# Patient Record
Sex: Female | Born: 1940 | Race: White | Hispanic: No | Marital: Married | Smoking: Never smoker
Health system: Southern US, Community
[De-identification: ages and names within clinical notes are randomized; demographics above are authoritative.]

## PROBLEM LIST (undated history)

## (undated) DIAGNOSIS — K5900 Constipation, unspecified: Secondary | ICD-10-CM

## (undated) DIAGNOSIS — E785 Hyperlipidemia, unspecified: Secondary | ICD-10-CM

## (undated) DIAGNOSIS — K219 Gastro-esophageal reflux disease without esophagitis: Secondary | ICD-10-CM

## (undated) DIAGNOSIS — D649 Anemia, unspecified: Secondary | ICD-10-CM

## (undated) DIAGNOSIS — I6522 Occlusion and stenosis of left carotid artery: Secondary | ICD-10-CM

## (undated) DIAGNOSIS — F32A Depression, unspecified: Secondary | ICD-10-CM

## (undated) DIAGNOSIS — E119 Type 2 diabetes mellitus without complications: Secondary | ICD-10-CM

## (undated) DIAGNOSIS — I639 Cerebral infarction, unspecified: Secondary | ICD-10-CM

## (undated) DIAGNOSIS — I2510 Atherosclerotic heart disease of native coronary artery without angina pectoris: Secondary | ICD-10-CM

## (undated) DIAGNOSIS — G459 Transient cerebral ischemic attack, unspecified: Secondary | ICD-10-CM

## (undated) DIAGNOSIS — I10 Essential (primary) hypertension: Secondary | ICD-10-CM

## (undated) DIAGNOSIS — M1990 Unspecified osteoarthritis, unspecified site: Secondary | ICD-10-CM

## (undated) DIAGNOSIS — F329 Major depressive disorder, single episode, unspecified: Secondary | ICD-10-CM

## (undated) HISTORY — PX: ABDOMINAL HYSTERECTOMY: SHX81

## (undated) HISTORY — PX: JOINT REPLACEMENT: SHX530

## (undated) HISTORY — PX: CORONARY ANGIOPLASTY: SHX604

---
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
## 2019-02-12 ENCOUNTER — Inpatient Hospital Stay (HOSPITAL_BASED_OUTPATIENT_CLINIC_OR_DEPARTMENT_OTHER)
Admission: EM | Admit: 2019-02-12 | Discharge: 2019-02-16 | DRG: 037 | Disposition: A | Discharge status: Home Health | Payer: Medicare Other | Attending: Family Medicine | Admitting: Family Medicine

## 2019-02-12 ENCOUNTER — Encounter (HOSPITAL_BASED_OUTPATIENT_CLINIC_OR_DEPARTMENT_OTHER): Payer: Self-pay

## 2019-02-12 ENCOUNTER — Emergency Department (HOSPITAL_BASED_OUTPATIENT_CLINIC_OR_DEPARTMENT_OTHER): Payer: Medicare Other

## 2019-02-12 ENCOUNTER — Emergency Department (HOSPITAL_COMMUNITY): Payer: Medicare Other

## 2019-02-12 ENCOUNTER — Other Ambulatory Visit: Payer: Self-pay

## 2019-02-12 DIAGNOSIS — E663 Overweight: Secondary | ICD-10-CM | POA: Diagnosis not present

## 2019-02-12 DIAGNOSIS — E876 Hypokalemia: Secondary | ICD-10-CM | POA: Diagnosis not present

## 2019-02-12 DIAGNOSIS — I771 Stricture of artery: Secondary | ICD-10-CM | POA: Diagnosis present

## 2019-02-12 DIAGNOSIS — I10 Essential (primary) hypertension: Secondary | ICD-10-CM | POA: Diagnosis not present

## 2019-02-12 DIAGNOSIS — E785 Hyperlipidemia, unspecified: Secondary | ICD-10-CM | POA: Diagnosis not present

## 2019-02-12 DIAGNOSIS — I351 Nonrheumatic aortic (valve) insufficiency: Secondary | ICD-10-CM | POA: Diagnosis not present

## 2019-02-12 DIAGNOSIS — E119 Type 2 diabetes mellitus without complications: Secondary | ICD-10-CM

## 2019-02-12 DIAGNOSIS — E1151 Type 2 diabetes mellitus with diabetic peripheral angiopathy without gangrene: Secondary | ICD-10-CM | POA: Diagnosis present

## 2019-02-12 DIAGNOSIS — I6381 Other cerebral infarction due to occlusion or stenosis of small artery: Secondary | ICD-10-CM | POA: Diagnosis not present

## 2019-02-12 DIAGNOSIS — I639 Cerebral infarction, unspecified: Secondary | ICD-10-CM

## 2019-02-12 DIAGNOSIS — Z8249 Family history of ischemic heart disease and other diseases of the circulatory system: Secondary | ICD-10-CM

## 2019-02-12 DIAGNOSIS — I160 Hypertensive urgency: Secondary | ICD-10-CM | POA: Diagnosis present

## 2019-02-12 DIAGNOSIS — Z7902 Long term (current) use of antithrombotics/antiplatelets: Secondary | ICD-10-CM

## 2019-02-12 DIAGNOSIS — R7401 Elevation of levels of liver transaminase levels: Secondary | ICD-10-CM | POA: Diagnosis present

## 2019-02-12 DIAGNOSIS — Z1159 Encounter for screening for other viral diseases: Secondary | ICD-10-CM

## 2019-02-12 DIAGNOSIS — I672 Cerebral atherosclerosis: Secondary | ICD-10-CM | POA: Diagnosis not present

## 2019-02-12 DIAGNOSIS — I6601 Occlusion and stenosis of right middle cerebral artery: Secondary | ICD-10-CM | POA: Diagnosis present

## 2019-02-12 DIAGNOSIS — Z6828 Body mass index (BMI) 28.0-28.9, adult: Secondary | ICD-10-CM | POA: Diagnosis not present

## 2019-02-12 DIAGNOSIS — I2510 Atherosclerotic heart disease of native coronary artery without angina pectoris: Secondary | ICD-10-CM | POA: Diagnosis not present

## 2019-02-12 DIAGNOSIS — Z955 Presence of coronary angioplasty implant and graft: Secondary | ICD-10-CM

## 2019-02-12 DIAGNOSIS — R740 Nonspecific elevation of levels of transaminase and lactic acid dehydrogenase [LDH]: Secondary | ICD-10-CM | POA: Diagnosis not present

## 2019-02-12 DIAGNOSIS — Z888 Allergy status to other drugs, medicaments and biological substances status: Secondary | ICD-10-CM

## 2019-02-12 DIAGNOSIS — R4701 Aphasia: Secondary | ICD-10-CM | POA: Diagnosis present

## 2019-02-12 DIAGNOSIS — R29700 NIHSS score 0: Secondary | ICD-10-CM | POA: Diagnosis not present

## 2019-02-12 DIAGNOSIS — D72829 Elevated white blood cell count, unspecified: Secondary | ICD-10-CM | POA: Diagnosis not present

## 2019-02-12 DIAGNOSIS — I6522 Occlusion and stenosis of left carotid artery: Secondary | ICD-10-CM

## 2019-02-12 DIAGNOSIS — Z7982 Long term (current) use of aspirin: Secondary | ICD-10-CM

## 2019-02-12 DIAGNOSIS — G4733 Obstructive sleep apnea (adult) (pediatric): Secondary | ICD-10-CM | POA: Diagnosis present

## 2019-02-12 DIAGNOSIS — Z79899 Other long term (current) drug therapy: Secondary | ICD-10-CM

## 2019-02-12 DIAGNOSIS — E1165 Type 2 diabetes mellitus with hyperglycemia: Secondary | ICD-10-CM | POA: Diagnosis not present

## 2019-02-12 DIAGNOSIS — G459 Transient cerebral ischemic attack, unspecified: Secondary | ICD-10-CM | POA: Diagnosis not present

## 2019-02-12 DIAGNOSIS — R270 Ataxia, unspecified: Secondary | ICD-10-CM | POA: Diagnosis not present

## 2019-02-12 HISTORY — DX: Hyperlipidemia, unspecified: E78.5

## 2019-02-12 HISTORY — DX: Essential (primary) hypertension: I10

## 2019-02-12 LAB — SARS CORONAVIRUS 2 AG (30 MIN TAT): SARS Coronavirus 2 Ag: NEGATIVE

## 2019-02-12 LAB — COMPREHENSIVE METABOLIC PANEL
ALT: 68 U/L — ABNORMAL HIGH (ref 0–44)
AST: 42 U/L — ABNORMAL HIGH (ref 15–41)
Albumin: 3.8 g/dL (ref 3.5–5.0)
Alkaline Phosphatase: 55 U/L (ref 38–126)
Anion gap: 12 (ref 5–15)
BUN: 16 mg/dL (ref 8–23)
CO2: 26 mmol/L (ref 22–32)
Calcium: 9.1 mg/dL (ref 8.9–10.3)
Chloride: 100 mmol/L (ref 98–111)
Creatinine, Ser: 0.41 mg/dL — ABNORMAL LOW (ref 0.44–1.00)
GFR calc Af Amer: >60 mL/min (ref >60)
GFR calc non Af Amer: >60 mL/min (ref >60)
Glucose, Bld: 176 mg/dL — ABNORMAL HIGH (ref 70–99)
Potassium: 3.4 mmol/L — ABNORMAL LOW (ref 3.5–5.1)
Sodium: 138 mmol/L (ref 135–145)
Total Bilirubin: 0.4 mg/dL (ref 0.3–1.2)
Total Protein: 6.8 g/dL (ref 6.5–8.1)

## 2019-02-12 LAB — DIFFERENTIAL
Abs Immature Granulocytes: 0.05 10*3/uL (ref 0.00–0.07)
Basophils Absolute: 0.1 10*3/uL (ref 0.0–0.1)
Basophils Relative: 1 %
Eosinophils Absolute: 0.2 10*3/uL (ref 0.0–0.5)
Eosinophils Relative: 2 %
Immature Granulocytes: 1 %
Lymphocytes Relative: 24 %
Lymphs Abs: 2.5 10*3/uL (ref 0.7–4.0)
Monocytes Absolute: 0.8 10*3/uL (ref 0.1–1.0)
Monocytes Relative: 8 %
Neutro Abs: 6.8 10*3/uL (ref 1.7–7.7)
Neutrophils Relative %: 64 %

## 2019-02-12 LAB — URINALYSIS, ROUTINE W REFLEX MICROSCOPIC
Bilirubin Urine: NEGATIVE
Glucose, UA: NEGATIVE mg/dL
Ketones, ur: NEGATIVE mg/dL
Nitrite: NEGATIVE
Protein, ur: NEGATIVE mg/dL
Specific Gravity, Urine: 1.02 (ref 1.005–1.030)
pH: 7.5 (ref 5.0–8.0)

## 2019-02-12 LAB — PROTIME-INR
INR: 0.9 (ref 0.8–1.2)
Prothrombin Time: 12 seconds (ref 11.4–15.2)

## 2019-02-12 LAB — RAPID URINE DRUG SCREEN, HOSP PERFORMED
Amphetamines: NOT DETECTED
Barbiturates: NOT DETECTED
Benzodiazepines: NOT DETECTED
Cocaine: NOT DETECTED
Opiates: NOT DETECTED
Tetrahydrocannabinol: NOT DETECTED

## 2019-02-12 LAB — CBC
HCT: 40.2 % (ref 36.0–46.0)
Hemoglobin: 13 g/dL (ref 12.0–15.0)
MCH: 27.4 pg (ref 26.0–34.0)
MCHC: 32.3 g/dL (ref 30.0–36.0)
MCV: 84.6 fL (ref 80.0–100.0)
Platelets: 248 10*3/uL (ref 150–400)
RBC: 4.75 MIL/uL (ref 3.87–5.11)
RDW: 13.2 % (ref 11.5–15.5)
WBC: 10.5 10*3/uL (ref 4.0–10.5)
nRBC: 0 % (ref 0.0–0.2)

## 2019-02-12 LAB — URINALYSIS, MICROSCOPIC (REFLEX)

## 2019-02-12 LAB — GLUCOSE, CAPILLARY: Glucose-Capillary: 237 mg/dL — ABNORMAL HIGH (ref 70–99)

## 2019-02-12 LAB — CBG MONITORING, ED: Glucose-Capillary: 184 mg/dL — ABNORMAL HIGH (ref 70–99)

## 2019-02-12 LAB — APTT: aPTT: 27 seconds (ref 24–36)

## 2019-02-12 LAB — ETHANOL: Alcohol, Ethyl (B): <10 mg/dL (ref <10)

## 2019-02-12 MED ORDER — IOHEXOL 350 MG/ML SOLN
100.0000 mL | Freq: Once | INTRAVENOUS | Status: AC | PRN
  Administered 2019-02-12: 18:00:00 100 mL via INTRAVENOUS

## 2019-02-12 MED ORDER — ALTEPLASE 100 MG IV SOLR
INTRAVENOUS | Status: AC
  Filled 2019-02-12: qty 1

## 2019-02-12 MED ORDER — NICARDIPINE HCL IN NACL 20-0.86 MG/200ML-% IV SOLN
INTRAVENOUS | Status: AC
  Administered 2019-02-12: 17:00:00 2.5 mg/h via INTRAVENOUS
  Filled 2019-02-12: qty 200

## 2019-02-12 MED ORDER — NICARDIPINE HCL IN NACL 20-0.86 MG/200ML-% IV SOLN
3.0000 mg/h | INTRAVENOUS | Status: DC
  Administered 2019-02-12: 2.5 mg/h via INTRAVENOUS

## 2019-02-12 MED ORDER — SODIUM CHLORIDE 0.9 % IV SOLN
INTRAVENOUS | Status: DC | PRN
  Administered 2019-02-12: 500 mL via INTRAVENOUS

## 2019-02-12 MED ORDER — INSULIN ASPART 100 UNIT/ML ~~LOC~~ SOLN
0.0000 [IU] | Freq: Every day | SUBCUTANEOUS | Status: DC
  Administered 2019-02-13 (×2): 2 [IU] via SUBCUTANEOUS
  Administered 2019-02-15: 3 [IU] via SUBCUTANEOUS

## 2019-02-12 MED ORDER — LABETALOL HCL 5 MG/ML IV SOLN: 5.0000 mg | INTRAVENOUS | Status: DC | PRN

## 2019-02-12 MED ORDER — INSULIN ASPART 100 UNIT/ML ~~LOC~~ SOLN
0.0000 [IU] | Freq: Three times a day (TID) | SUBCUTANEOUS | Status: DC
  Administered 2019-02-13: 18:00:00 2 [IU] via SUBCUTANEOUS
  Administered 2019-02-13: 3 [IU] via SUBCUTANEOUS
  Administered 2019-02-14 – 2019-02-15 (×2): 1 [IU] via SUBCUTANEOUS
  Administered 2019-02-16: 12:00:00 2 [IU] via SUBCUTANEOUS
  Administered 2019-02-16: 5 [IU] via SUBCUTANEOUS

## 2019-02-12 NOTE — ED Notes (Signed)
Family at bedside. 

## 2019-02-12 NOTE — Plan of Care (Signed)
progressing towards goals

## 2019-02-12 NOTE — ED Notes (Signed)
Report given to [place] RN with [employer]

## 2019-02-12 NOTE — ED Notes (Signed)
Please contact Effie Shy  with updates at [phone].

## 2019-02-12 NOTE — ED Notes (Signed)
Pt to CT

## 2019-02-12 NOTE — Consult Note (Addendum)
Referring Physician: Dr. Lockie Molauratolo    Chief Complaint: Intermittent expressive aphasia  HPI: Margaret Butler is an 78 y.o. female who [facility] as a Code Stroke.  Past medical history include hypertension, hyperlipidemia, placement of 2 coronary artery stents, no hx of stroke, TIA, or MI. Patient's last known normal is 1330. Per patient and her husband she started experiencing intermittent transient expressive aphasia shortly after. Teleneurology was consulted. Was within TPA window while at OSH, but was not deemed a candidate given mild symptoms and NIHSS of 0. CT at [facility] was negative for acute infarct or hemorrhage and she was transferred over to [facility] for further eval/work-up for possible TIA/stroke.  Upon arrival to the ED patient's aphasia had fully resolved. 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  Exam was fairly unremarkable except for right sided extinction and right upper extremity ataxia.   LSN: [id number] tPA Given: no, out of time window  Past Medical History:  Diagnosis Date  . Hyperlipidemia   . Hypertension     History reviewed. No pertinent surgical history.  History reviewed. No pertinent family history. Social History:  has no history on file for tobacco, alcohol, and drug.  Allergies:  Allergies  Allergen Reactions  . Atorvastatin     Other reaction(s): Other (See Comments) Myalgias  . Lisinopril Cough       . Sertraline Other (See Comments)    Headache  Headache    . Simvastatin     Other reaction(s): Other (See Comments) Myalgias    Home Medications:   Current Meds  Medication Sig  . aspirin EC 325 MG tablet Take 325 mg by mouth as needed (for sudden onset of stroke-like symptoms).  Marland Kitchen. aspirin EC 81 MG tablet  Take 81 mg by mouth as needed (for headaches).  . Calcium Carbonate-Vitamin D (CALCIUM 600+D) 600-400 MG-UNIT tablet Take 1 tablet by mouth daily with breakfast.   . clopidogrel (PLAVIX) 75 MG tablet Take 75 mg by mouth daily at 8 pm.   . Dextromethorphan-guaiFENesin (CORICIDIN HBP CONGESTION/COUGH) 10-200 MG CAPS Take 1 capsule by mouth every 8 (eight) hours as needed (for congestion).  Marland Kitchen. escitalopram (LEXAPRO) 10 MG tablet Take 10 mg by mouth daily after breakfast.   . Magnesium 250 MG TABS Take 500 mg by mouth daily after supper.  . metoprolol succinate (TOPROL-XL) 25 MG 24 hr tablet Take 50 mg by mouth daily after supper.   . Omega-3 Fatty Acids (FISH OIL) 1000 MG CAPS Take 1,000 mg by mouth daily with breakfast.  . Potassium Gluconate 595 MG TBCR Take 595 mg by mouth daily with breakfast.  . simvastatin (ZOCOR) 20 MG tablet Take 20 mg by mouth at bedtime.   Marland Kitchen. telmisartan-hydrochlorothiazide (MICARDIS HCT) 80-25 MG tablet Take 1 tablet by mouth every morning.   . TURMERIC PO Take 1,500-3,000 mg by mouth daily with breakfast.     ROS: Review of Systems  Constitutional: Negative.   HENT: Negative.   Eyes: Negative.   Respiratory: Negative.   Cardiovascular: Negative.  Negative for chest pain.  Gastrointestinal: Negative.   Genitourinary: Positive for frequency and urgency.  Musculoskeletal: Negative.   Skin: Negative.   Neurological: Positive for tingling, sensory change, speech change and weakness. Negative for dizziness.  Endo/Heme/Allergies: Negative.   Psychiatric/Behavioral: Negative for  depression and substance abuse. The patient is not nervous/anxious.    Physical Examination: Blood pressure (!) 170/51, pulse 74, temperature 98.1 F (36.7 C), temperature source Oral, resp. rate 16, height 5\' 3"  (1.6 m), weight 68.4 kg, SpO2 98 %.  HEENT: [place]/AT Lungs: [place]/AT Ext: No edema  Neurologic Examination: Mental Status: Alert, fully oriented x 5. No dysarthria. No increased  latency of verbal or motor responses. Good eye contact. Good attention. Able to follow a complex directional 3 step command quickly. Speech fluent. Naming intact. No neglect.  Cranial Nerves: II:  Visual fields intact with no extinction to DSS. PERRL.  III,IV, VI: Ptosis not present, EOMI with no nystagmus.  V,VII: Temp sensation equal bilaterally. No facial droop VIII: hearing intact to voice IX,X: Phonation intact.  XI: Symmetric shoulder shrug XII: midline tongue extension  Motor: Right : Upper extremity   5/5    Left:     Upper extremity   5/5  Lower extremity   5/5     Lower extremity   5/5 Tone and bulk:normal tone throughout; no atrophy noted Sensory: Temp intact x 4. RHB extinction. Deep Tendon Reflexes:  Right: Upper Extremity   Left: Upper extremity   biceps (C-5 to C-6) 2/4   biceps (C-5 to C-6) 2/4 tricep (C7) 2/4    triceps (C7) 2/4 Brachioradialis (C6) 2/4  Brachioradialis (C6) 2/4  Lower Extremity Lower Extremity  quadriceps (L-2 to L-4) 2/4   quadriceps (L-2 to L-4) 2/4 Achilles (S1) 2/4   Achilles (S1) 2/4  Plantars: Right: downgoing   Left: downgoing Cerebellar: RUE ataxia with finger to nose. Normal FNF on the left. Normal heel-to-shin testing bilaterally. Gait: Deferred  Labs: CBC    Component Value Date/Time   WBC 10.5 02/12/2019 1623   RBC 4.75 02/12/2019 1623   HGB 13.0 02/12/2019 1623   HCT 40.2 02/12/2019 1623   PLT 248 02/12/2019 1623   MCV 84.6 02/12/2019 1623   MCH 27.4 02/12/2019 1623   MCHC 32.3 02/12/2019 1623   RDW 13.2 02/12/2019 1623   LYMPHSABS 2.5 02/12/2019 1623   MONOABS 0.8 02/12/2019 1623   EOSABS 0.2 02/12/2019 1623   BASOSABS 0.1 02/12/2019 1623   CMP     Component Value Date/Time   NA 138 02/12/2019 1623   K 3.4 (L) 02/12/2019 1623   CL 100 02/12/2019 1623   CO2 26 02/12/2019 1623   GLUCOSE 176 (H) 02/12/2019 1623   BUN 16 02/12/2019 1623   CREATININE 0.41 (L) 02/12/2019 1623   CALCIUM 9.1 02/12/2019 1623   PROT  6.8 02/12/2019 1623   ALBUMIN 3.8 02/12/2019 1623   AST 42 (H) 02/12/2019 1623   ALT 68 (H) 02/12/2019 1623   ALKPHOS 55 02/12/2019 1623   BILITOT 0.4 02/12/2019 1623   GFRNONAA >60 02/12/2019 1623   GFRAA >60 02/12/2019 1623   Imaging: Ct Angio Head W Or Wo Contrast 02/12/2019 IMPRESSION: 1. There are several age indeterminate Severe intracranial stenoses: - Basilar Artery proximal 3rd (near occlusion). - Right MCA M1. - Right MCA posterior M2. 2. Otherwise negative for large vessel occlusion, but there are additional moderate to severe arterial stenoses: - Right ICA supraclinoid segment. - bilateral vertebral artery V4 segments. 3. Bilateral carotid bifurcation atherosclerosis resulting in 50-60% stenosis of both ICA origins.   Ct Head Code Stroke Wo Contrast 02/12/2019 IMPRESSION: 1. No acute cortically based infarct or acute intracranial hemorrhage identified. ASPECTS 10. 2. Moderately advanced chronic cerebral white matter disease, most commonly due to chronic  small vessel ischemia.  Assessment: 78 y.o. female presenting in transfer from [facility] with stuttering TIA symptoms consisting of expressive aphasia without motor or sensory deficit. LKN was 1330. Never returned completely to baseline per patient, but has had transient improvements to near-normal speech.  Patient presentation point towards TIA versus possible thalamic stroke, likely secondary to underlying small vessel disease, especially in setting of aphasia, right-sided extinction and ataxia. 1. Neurological exam reveals no aphasia or dysarthria.  There is right-sided extinction and right upper extremity ataxia, which best localize as a left thalamic lacunar infarction. Out of tPA time window. Not an acute endovascular candidate due to resolved symptoms, with NIHSS of 2 corresponding to relatively mild deficits that the patient is unaware of (asymptomatic deficits), as well as no LVO. However, may benefit from left CEA (has prominent  atherosclerotic narrowing of left carotid bulb on CTA) 2. CT head at OSH showed no acute abnormality. Moderately advanced chronic small vessel disease is noted.   3. CTA significant for severe intracranial stenosis (basilar artery proximal third, right M1, R posterior M2) and moderate to severe arterial stenosis of right ICA supraclinoid segment and bilateral vertebral V4 segments, bilateral ICA showed 50 to 60% stenosis, otherwise negative for other large vessel occlusion. 4. Stroke Risk Factors - carotid stenosis, hyperlipidemia, hypertension and hx of high a1c (patient not sure of number), severe intracranial stenoses  Plan: 1. HgbA1c, fasting lipid panel 2. MRI of the brain without contrast 3. PT consult, OT consult, Speech consult 4. Echocardiogram 5. Carotid dopplers to assess candidacy for possible left CEA 6. Prophylactic therapy- Continue ASA and Plavix  7. Risk factor modification 8. Telemetry monitoring 9. Frequent neuro checks 10. Permissive HTN. Treat only if SBP > 220. 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  48. Consult Dr. Estanislado Pandy in the AM on Monday for possible 4 vessel diagnostic cerebral angiogram. She may benefit from basilar artery stenting, although it should be noted that this is presently asymptomatic and stenting in this region would be a high-risk procedure. Would discuss this option with patient in the AM.   @Electronically  signed: Dr. Kerney Elbe  02/12/2019, 7:26 PM

## 2019-02-12 NOTE — ED Notes (Signed)
ED Provider at bedside. 

## 2019-02-12 NOTE — ED Provider Notes (Addendum)
Patient transferred as a code stroke for meds at [facility].  Has had stuttering and expressive aphasia since 130.  Symptoms overall have resolved.  No acute stroke is found on CT scan.  Overall age-indeterminate strokes.  Lab work thus far unremarkable.  Patient was hypertensive.  Neurology states allow for permissive hypertension with blood pressure goal less than 859 systolic.  Blood pressure on arrival 181/63.  Patient to be admitted to medicine for stroke work-up, MRI.  This chart was dictated using voice recognition software.  Despite best efforts to proofread,  errors can occur which can change the documentation meaning.     Lennice Sites, DO 02/12/19 [place], [place], DO 02/12/19 2924

## 2019-02-12 NOTE — Consult Note (Signed)
[facility] TeleNeurology Consult Services   Date of Service:   02/12/2019 16:16:35  Impression:     .  Transient Ischemic Attack  Comments/Sign-Out: 78 yo F with transient expressive aphasia lasting over 3 hours now resolved. Within tPA window but NIHSS 0 and only had mild symptoms. Recommend admission for TIA/stroke workup, and continue Plavix daily  Mechanism of Stroke: Possible Cardioembolic Small Vessel Disease  Metrics: Last Known Well: 02/12/2019 12:30:00 [facility] Notification Time: 02/12/2019 16:16:35 Arrival Time: 02/12/2019 16:00:00 Stamp Time: 02/12/2019 16:16:35 Time First Login Attempt: 02/12/2019 16:24:43 Video Start Time: 02/12/2019 16:25:59  Symptoms: aphaisa/slurred speech NIHSS Start Assessment Time: 02/12/2019 16:27:15 Patient is not a candidate for tPA. Patient was not deemed candidate for tPA thrombolytics because of Resolved symptoms (no residual disabling symptoms). Video End Time: 02/12/2019 16:40:15  CT head showed no acute hemorrhage or acute core infarct.  Clinical Presentation is not Suggestive of Large Vessel Occlusive Disease  ED Physician notified of diagnostic impression and management plan on 02/12/2019 16:40:28  Our recommendations are outlined below.  Recommendations:     .  Activate Stroke Protocol Admission/Order Set     .  Stroke/Telemetry Floor     .  Neuro Checks     .  Bedside Swallow Eval     .  DVT Prophylaxis     .  IV Fluids, Normal Saline     .  Head of Bed 30 Degrees     .  Euglycemia and Avoid Hyperthermia (PRN Acetaminophen)     .  Antiplatelet Therapy Recommended     .  Initiate Plavix  Routine Consultation with Inhouse Neurology for Follow up Care  Sign Out:     .  Discussed with Emergency Department Provider    ------------------------------------------------------------------------------  History of Present Illness: Patient is a 78 year old Female.  Patient was brought by  private transportation with symptoms of aphaisa/slurred speech  hx HTN, DM2, OSA, PAD, CAD s/p stent on Plavix and carotid stenosis who was eating lunch at 1230 and was normal. Between 1300 and 1330 she had slurred speech. She felt difficulty getting words out. She has a headache around her left eye. She took 2 full strength aspirin already. Symptoms have resolved  Last seen normal was within 4.5 hours. There is no history of hemorrhagic complications or intracranial hemorrhage. There is no history of Recent Anticoagulants. There is no history of recent major surgery. There is no history of recent stroke.  Examination: Blood Glucose(184) 1A: Level of Consciousness - Alert; keenly responsive + 0 1B: Ask Month and Age - Both Questions Right + 0 1C: Blink Eyes & Squeeze Hands - Performs Both Tasks + 0 2: Test Horizontal Extraocular Movements - Normal + 0 3: Test Visual Fields - No Visual Loss + 0 4: Test Facial Palsy (Use Grimace if Obtunded) - Normal symmetry + 0 5A: Test Left Arm Motor Drift - No Drift for 10 Seconds + 0 5B: Test Right Arm Motor Drift - No Drift for 10 Seconds + 0 6A: Test Left Leg Motor Drift - No Drift for 5 Seconds + 0 6B: Test Right Leg Motor Drift - No Drift for 5 Seconds + 0 7: Test Limb Ataxia (FNF/Heel-Shin) - No Ataxia + 0 8: Test Sensation - Normal; No sensory loss + 0 9: Test Language/Aphasia - Normal; No aphasia + 0 10: Test Dysarthria - Normal + 0 11: Test Extinction/Inattention - No abnormality + 0  NIHSS Score: 0  Patient/Family was informed  the Neurology Consult would happen via TeleHealth consult by way of interactive audio and video telecommunications and consented to receiving care in this manner.  Due to the immediate potential for life-threatening deterioration due to underlying acute neurologic illness, I spent 35 minutes providing critical care. This time includes time for face to face visit via telemedicine, review of medical records, imaging  studies and discussion of findings with providers, the patient and/or family.   Dr Leda Quail   [facility] [phone]   Case [id number]

## 2019-02-12 NOTE — ED Provider Notes (Signed)
[facility] EMERGENCY DEPARTMENT Provider Note   CSN: [id number] Arrival date & time: 02/12/19  1600  An emergency department physician performed an initial assessment on this suspected stroke patient at 1620.  History   Chief Complaint Chief Complaint  Patient presents with   Code Stroke    HPI Margaret Butler is a 78 y.o. female.     78yo F w/ PMH including HTN, HLD who p/w speech problem.  Been states that today around 1:30 PM, she began having problems with her speech.  She laid down for a nap and when she woke up she continued to have speech problems and they decided to come to the ER for evaluation.  Husband notes that since being here her speech seems to be improving but is still not at baseline.  This has never happened before.  No history of stroke or TIA.  No recent illness.  LEVEL 5 CAVEAT DUE TO APHASIA  The history is provided by the patient.    Past Medical History:  Diagnosis Date   Hyperlipidemia    Hypertension     There are no active problems to display for this patient.   ** The histories are not reviewed yet. Please review them in the "History" navigator section and refresh this SmartLink.   OB History   No obstetric history on file.      Home Medications    Prior to Admission medications   Not on File    Family History No family history on file.  Social History Social History   Tobacco Use   Smoking status: Not on file  Substance Use Topics   Alcohol use: Not on file   Drug use: Not on file     Allergies   Lisinopril and Sertraline   Review of Systems Review of Systems  Unable to perform ROS: Mental status change     Physical Exam Updated Vital Signs BP (!) 224/74    Pulse 64    Temp 98.2 F (36.8 C) (Oral)    Resp 16    Ht 5\' 3"  (1.6 m)    Wt 68.4 kg    SpO2 97%    BMI 26.70 kg/m   Physical Exam Vitals signs and nursing note reviewed.  Constitutional:      General: She is not in acute distress.  Appearance: She is well-developed.     Comments: Awake, alert  HENT:     Head: Normocephalic and atraumatic.  Eyes:     Extraocular Movements: Extraocular movements intact.     Conjunctiva/sclera: Conjunctivae normal.     Pupils: Pupils are equal, round, and reactive to light.  Neck:     Musculoskeletal: Neck supple.  Cardiovascular:     Rate and Rhythm: Normal rate and regular rhythm.     Heart sounds: Normal heart sounds. No murmur.  Pulmonary:     Effort: Pulmonary effort is normal. No respiratory distress.     Breath sounds: Normal breath sounds.  Abdominal:     General: Bowel sounds are normal. There is no distension.     Palpations: Abdomen is soft.     Tenderness: There is no abdominal tenderness.  Skin:    General: Skin is warm and dry.  Neurological:     Mental Status: She is alert and oriented to person, place, and time.     Cranial Nerves: No cranial nerve deficit.     Motor: No abnormal muscle tone.     Deep Tendon Reflexes: Reflexes  are normal and symmetric.     Comments: Mild receptive and expressive aphasia, normal finger-to-nose testing, negative pronator drift, no clonus 5/5 strength and normal sensation x all 4 extremities Reports mild numbness on L eyebrow only  Psychiatric:        Thought Content: Thought content normal.        Judgment: Judgment normal.      ED Treatments / Results  Labs (all labs ordered are listed, but only abnormal results are displayed) Labs Reviewed  COMPREHENSIVE METABOLIC PANEL - Abnormal; Notable for the following components:      Result Value   Potassium 3.4 (*)    Glucose, Bld 176 (*)    Creatinine, Ser 0.41 (*)    AST 42 (*)    ALT 68 (*)    All other components within normal limits  URINALYSIS, ROUTINE W REFLEX MICROSCOPIC - Abnormal; Notable for the following components:   Hgb urine dipstick TRACE (*)    Leukocytes,Ua TRACE (*)    All other components within normal limits  URINALYSIS, MICROSCOPIC (REFLEX) -  Abnormal; Notable for the following components:   Bacteria, UA FEW (*)    All other components within normal limits  CBG MONITORING, ED - Abnormal; Notable for the following components:   Glucose-Capillary 184 (*)    All other components within normal limits  SARS CORONAVIRUS 2 (HOSP ORDER, PERFORMED IN [place] [facility] VIA ABBOTT ID)  ETHANOL  PROTIME-INR  APTT  CBC  DIFFERENTIAL  RAPID URINE DRUG SCREEN, HOSP PERFORMED    EKG EKG Interpretation  Date/Time:  Saturday February 12 2019 16:41:09 EDT Ventricular Rate:  67 PR Interval:    QRS Duration: 102 QT Interval:  436 QTC Calculation: 461 R Axis:   56 Text Interpretation:  Sinus rhythm Probable left atrial enlargement LVH with secondary repolarization abnormality No previous ECGs available Confirmed by Frederick PeersLittle, Hazyl [place] [phone]([id number]) on 02/12/2019 4:44:27 PM   Radiology Ct Head Code Stroke Wo Contrast  Result Date: 02/12/2019 CLINICAL DATA:  Code stroke. 78 year old female with intermittent slurred speech since 1300 hours. EXAM: CT HEAD WITHOUT CONTRAST TECHNIQUE: Contiguous axial images were obtained from the base of the skull through the vertex without intravenous contrast. COMPARISON:  None. FINDINGS: Brain: Patchy and confluent bilateral cerebral white matter hypodensity. No acute intracranial hemorrhage identified. No midline shift, mass effect, or evidence of intracranial mass lesion. No ventriculomegaly. Mild heterogeneity of the right basal ganglia. No definite cortical encephalomalacia. No cortically based acute infarct identified. Vascular: Extensive Calcified atherosclerosis at the skull base. No suspicious intracranial vascular hyperdensity. Skull: No acute osseous abnormality identified. Sinuses/Orbits: Paranasal Visualized paranasal sinuses and mastoids well pneumatized. Other: No acute orbit or scalp soft tissue findings. ASPECTS [facility]([place] Stroke Program Early CT Score) - Ganglionic level infarction (caudate, lentiform nuclei, internal  capsule, insula, M1-M3 cortex): 7 - Supraganglionic infarction (M4-M6 cortex): 3 Total score (0-10 with 10 being normal): 10 IMPRESSION: 1. No acute cortically based infarct or acute intracranial hemorrhage identified. ASPECTS 10. 2. Moderately advanced chronic cerebral white matter disease, most commonly due to chronic small vessel ischemia. Study discussed by telephone with Dr. Fleet ContrasACHEL Royanne Warshaw on 02/12/2019 at 16:33 . Electronically Signed   By: Odessa FlemingH  Hall M.D.   On: 02/12/2019 16:33    Procedures .Critical Care Performed by: Laurence SpatesLittle, Kostantinos Tallman Morgan, MD Authorized by: Laurence SpatesLittle, Yarenis Cerino Morgan, MD   Critical care provider statement:    Critical care time (minutes):  30   Critical care was necessary to treat or prevent imminent or  life-threatening deterioration of the following conditions:  CNS failure or compromise   Critical care was time spent personally by me on the following activities:  Development of treatment plan with patient or surrogate, discussions with consultants, examination of patient, obtaining history from patient or surrogate, ordering and performing treatments and interventions, ordering and review of laboratory studies, ordering and review of radiographic studies and re-evaluation of patient's condition   (including critical care time)  Medications Ordered in ED Medications  0.9 %  sodium chloride infusion (500 mLs Intravenous New Bag/Given 02/12/19 1725)  nicardipine (CARDENE) 20mg  in 0.86% saline 272ml IV infusion (0.1 mg/ml) (2.5 mg/hr Intravenous New Bag/Given 02/12/19 1728)  alteplase (ACTIVASE) 1 mg/mL injection (  Return to [facility] 02/12/19 1645)     Initial Impression / Assessment and Plan / ED Course  I have reviewed the triage vital signs and the nursing notes.  Pertinent labs & imaging results that were available during my care of the patient were reviewed by me and considered in my medical decision making (see chart for details).        Pt alert, able to follow basic  commands but with mild expressive and receptive aphasia. BP 238/82 on arrival. Activated code stroke, obtained head CT and called teleneurology.  Head CT shows no acute hemorrhage, advanced chronic cerebral white matter disease.  Discussed with radiologist.  Discussed symptoms with tele-neurologist, Dr. Dorris Fetch, who gave her NIH stroke score 0 and did not appreciate any speech deficits during his telemedicine exam.  Recommended proceeding with TIA work-up.  The patient then continued to have speech difficulty with her nurse, difficulty with word finding.  I contacted [facility] neurologist Dr. Cheral Marker, who felt that activating a code stroke was warranted given reoccurrence of symptoms.  He recommended blood pressure management as her blood pressure has increased to over 160 systolic. Ordered nicardipine drip at low dose. She will be transferred to [facility] ED for CTA/CTP. Discussed transfer w/ ED provider, Dr. Billy Fischer.  Final Clinical Impressions(s) / ED Diagnoses   Final diagnoses:  None    ED Discharge Orders    None       Jancie Kercher, Wenda Overland, MD 02/12/19 1757

## 2019-02-12 NOTE — ED Notes (Signed)
Tele-Neuro exam in progress 

## 2019-02-12 NOTE — ED Notes (Signed)
Pt to CT with Marva RN and Delrae Alfred EMT

## 2019-02-12 NOTE — ED Notes (Signed)
Patient transported to CT 

## 2019-02-12 NOTE — H&P (Addendum)
History and Physical    Margaret Butler Curling ZOX:[id number] DOB: 10-07-1940 DOA: 02/12/2019  PCP: System, Pcp Not In   Patient coming from: Home   Chief Complaint: Speech difficulty   HPI: Margaret Butler Salinger is a 78 y.o. female with medical history significant for hypertension and hyperlipidemia, now presenting to the emergency department for evaluation of speech difficulty.  Patient reports that she went to bed in her usual state last night, felt a little more fatigued than usual this morning, but was able to go about her usual activities, had been with her husband, they had lunch together and everything seemed to be normal, but at approximately 1:30 PM, she developed difficulty with her speech.  She seemed to have difficulty finding the right words to use.  She laid down for a nap, continued to have this problem when she woke up, and came to the ED for evaluation.  She denies any headache, change in vision or hearing, or focal numbness or weakness.  The patient reports that she had similar symptoms in the 1980s that resolved very quickly.  Patient's husband followed me into the hall after the exam to note that the patient has had some problems over months to years with using the wrong word occasionally or forgetting the name of a family member or friend briefly, but none of these events are very brief and the patient becomes upset when her husband expresses concern about it.  She denies any recent fevers, chills, cough, shortness of breath, or sick contacts.  ED Course: Upon arrival to the ED, patient is found to be afebrile, saturating well on room air, and hypertensive to 238/82.  EKG features sinus rhythm with LVH and repolarization abnormality.  Noncontrast head CT was negative for acute intracranial abnormality.  Chemistry panel is notable for glucose of 176 and slight elevation in transaminases.  CBC is unremarkable.  CTA head and neck reveal severe age indeterminant stenoses in several areas but  otherwise negative for large vessel occlusion.  Blood pressure was treated with nicardipine initially.  Neurology was consulted by the ED physician and recommends a medical admission for further evaluation and management.  Review of Systems:  All other systems reviewed and apart from HPI, are negative.  Past Medical History:  Diagnosis Date   Hyperlipidemia    Hypertension     History reviewed. No pertinent surgical history.   reports that she has never smoked. She has never used smokeless tobacco. She reports that she does not drink alcohol or use drugs.  Allergies  Allergen Reactions   Atorvastatin Other (See Comments)    Muscle pain   Lisinopril Cough        Sertraline Other (See Comments)    Caused headaches    Simvastatin Other (See Comments)    Muscle pain    History reviewed. No pertinent family history.   Prior to Admission medications   Medication Sig Start Date End Date Taking? Authorizing Provider  aspirin EC 325 MG tablet Take 325 mg by mouth as needed (for sudden onset of stroke-like symptoms).   Yes [provider]  aspirin EC 81 MG tablet Take 81 mg by mouth as needed (for headaches).   Yes [provider]  Calcium Carbonate-Vitamin D (CALCIUM 600+D) 600-400 MG-UNIT tablet Take 1 tablet by mouth daily with breakfast.    Yes [provider]  clopidogrel (PLAVIX) 75 MG tablet Take 75 mg by mouth daily at 8 pm.  01/19/19  Yes [provider]  Dextromethorphan-guaiFENesin (CORICIDIN  HBP CONGESTION/COUGH) 10-200 MG CAPS Take 1 capsule by mouth every 8 (eight) hours as needed (for congestion).   Yes [provider]  escitalopram (LEXAPRO) 10 MG tablet Take 10 mg by mouth daily after breakfast.  11/17/18  Yes [provider]  Magnesium 250 MG TABS Take 500 mg by mouth daily after supper.   Yes [provider]  metoprolol succinate (TOPROL-XL) 25 MG 24 hr tablet Take 50 mg by mouth daily after supper.   12/29/18  Yes [provider]  Omega-3 Fatty Acids (FISH OIL) 1000 MG CAPS Take 1,000 mg by mouth daily with breakfast.   Yes [provider]  Potassium Gluconate 595 MG TBCR Take 595 mg by mouth daily with breakfast.   Yes [provider]  simvastatin (ZOCOR) 20 MG tablet Take 20 mg by mouth at bedtime.  01/25/19  Yes [provider]  telmisartan-hydrochlorothiazide (MICARDIS HCT) 80-25 MG tablet Take 1 tablet by mouth daily. 11/13/18  Yes [provider]  TURMERIC PO Take 1,500-3,000 mg by mouth daily with breakfast.   Yes [provider]    Physical Exam: Vitals:   02/12/19 1730 02/12/19 1841 02/12/19 1854 02/12/19 1911  BP: (!) 224/74 (!) 181/63 (!) 181/55 (!) 170/51  Pulse: 64 77 75 74  Resp: 16  16 16   Temp:  98.1 F (36.7 C)    TempSrc:  Oral    SpO2: 97% 95% 95% 98%  Weight:      Height:        Constitutional: NAD, calm  Eyes: PERTLA, lids and conjunctivae normal ENMT: Mucous membranes are moist. Posterior pharynx clear of any exudate or lesions.   Neck: normal, supple, no masses, no thyromegaly Respiratory: clear to auscultation bilaterally, no wheezing, no crackles. No accessory muscle use.  Cardiovascular: S1 & S2 heard, regular rate and rhythm. No extremity edema.   Abdomen: No distension, no tenderness, soft. Bowel sounds normal.  Musculoskeletal: no clubbing / cyanosis. No joint deformity upper and lower extremities.   Skin: no significant rashes, lesions, ulcers. Warm, dry, well-perfused. Neurologic: CN 2-12 grossly intact. Sensation intact, DTR normal. Strength 5/5 in all 4 limbs.  Psychiatric:  Alert and oriented x 3. Pleasant, cooperative.    Labs on Admission: I have personally reviewed following labs and imaging studies  CBC: Recent Labs  Lab 02/12/19 1623  WBC 10.5  NEUTROABS 6.8  HGB 13.0  HCT 40.2  MCV 84.6  PLT 248   Basic Metabolic Panel: Recent Labs  Lab 02/12/19 1623  NA 138  K 3.4*  CL  100  CO2 26  GLUCOSE 176*  BUN 16  CREATININE 0.41*  CALCIUM 9.1   GFR: Estimated Creatinine Clearance: 55.5 mL/min (A) (by C-G formula based on SCr of 0.41 mg/dL (L)). Liver Function Tests: Recent Labs  Lab 02/12/19 1623  AST 42*  ALT 68*  ALKPHOS 55  BILITOT 0.4  PROT 6.8  ALBUMIN 3.8   No results for input(s): LIPASE, AMYLASE in the last 168 hours. No results for input(s): AMMONIA in the last 168 hours. Coagulation Profile: Recent Labs  Lab 02/12/19 1623  INR 0.9   Cardiac Enzymes: No results for input(s): CKTOTAL, CKMB, CKMBINDEX, TROPONINI in the last 168 hours. BNP (last 3 results) No results for input(s): PROBNP in the last 8760 hours. HbA1C: No results for input(s): HGBA1C in the last 72 hours. CBG: Recent Labs  Lab 02/12/19 1614  GLUCAP 184*   Lipid Profile: No results for input(s): CHOL, HDL, LDLCALC,  TRIG, CHOLHDL, LDLDIRECT in the last 72 hours. Thyroid Function Tests: No results for input(s): TSH, T4TOTAL, FREET4, T3FREE, THYROIDAB in the last 72 hours. Anemia Panel: No results for input(s): VITAMINB12, FOLATE, FERRITIN, TIBC, IRON, RETICCTPCT in the last 72 hours. Urine analysis:    Component Value Date/Time   COLORURINE YELLOW 02/12/2019 [place] 02/12/2019 1635   LABSPEC 1.020 02/12/2019 1635   PHURINE 7.5 02/12/2019 1635   GLUCOSEU NEGATIVE 02/12/2019 1635   HGBUR TRACE (A) 02/12/2019 1635   BILIRUBINUR NEGATIVE 02/12/2019 1635   KETONESUR NEGATIVE 02/12/2019 1635   PROTEINUR NEGATIVE 02/12/2019 1635   NITRITE NEGATIVE 02/12/2019 1635   LEUKOCYTESUR TRACE (A) 02/12/2019 1635   Sepsis Labs: @LABRCNTIP (procalcitonin:4,lacticidven:4) ) Recent Results (from the past 240 hour(s))  SARS Coronavirus 2 (Hosp order,Performed in [facility] via [facility] ID)     Status: None   Collection Time: 02/12/19  4:33 PM   Specimen: Dry Nasal Swab ([facility] Now)  Result Value Ref Range Status   SARS Coronavirus 2 ([facility] Now)  NEGATIVE NEGATIVE Final    Comment: (NOTE) SARS-CoV-2 target nucleic acids are NOT DETECTED. The SARS-CoV-2 RNA is generally detectable in upper and lower respiratory specimens during the acute phase of infection.  Negativeresults do not preclude SARS-CoV-2 infection, do not rule out coinfections with other pathogens, and should not be used as the  sole basis for treatment or other patient management decisions.  Negative results must be combined with clinical observations, patient history, and epidemiological information. The expected result is Negative. Fact Sheet for Patients: GolfingFamily.no Fact Sheet for Healthcare Providers: https://www.hernandez-brewer.com/ This test is not yet approved or cleared by the [place] FDA and  has been authorized for detection and/or diagnosis of SARS-CoV-2 by FDA under an Emergency Use Authorization (EUA).  This EUA will remain in effect (meaning this test can be used) for the duration of  the COVID19 declaration under Section 5 64(b)(1) of the Act, 21 U.S.C.  section [phone] 3(b)(1), unless the authorization is terminated or revoked sooner. Performed at [facility], [address] [place], [place] [address]      Radiological Exams on Admission: Ct Angio Head W Or Wo Contrast  Result Date: 02/12/2019 CLINICAL DATA:  78 year old female with abnormal speech, expressive aphasia. EXAM: CT ANGIOGRAPHY HEAD AND NECK TECHNIQUE: Multidetector CT imaging of the head and neck was performed using the standard protocol during bolus administration of intravenous contrast. Multiplanar CT image reconstructions and MIPs were obtained to evaluate the vascular anatomy. Carotid stenosis measurements (when applicable) are obtained utilizing NASCET criteria, using the distal internal carotid diameter as the denominator. CONTRAST:  18mL OMNIPAQUE IOHEXOL 350 MG/ML SOLN COMPARISON:  Head CT without contrast 1618 hours  today. FINDINGS: CTA NECK Skeleton: No acute osseous abnormality identified. Upper chest: No superior mediastinal lymphadenopathy. Normal enhancement of the visible central pulmonary arteries. Probable peribronchial atelectasis in the visible lungs. Other neck: Negative. Aortic arch: Calcified aortic atherosclerosis. Three vessel arch configuration. Right carotid system: Minimal calcified plaque at the right CCA origin without stenosis. Complex soft and calcified plaque at the right carotid bifurcation and affecting the right ICA origin with 50-60 % stenosis with respect to the distal vessel. Distal to the bulb the vessel is mildly tortuous but otherwise normal to the skull base. Left carotid system: Normal left CCA origin. Bulky calcified and lesser soft plaque at the left carotid bifurcation continuing into the left ICA origin and bulb resulting in stenosis up to  60 % with respect to the distal vessel (series 9, image 149). Distal to the bulb the vessel is normal to the skull base. Vertebral arteries: Calcified plaque in the proximal right subclavian artery without significant stenosis and spares the right vertebral artery origin. Patent right vertebral artery to the skull base with only mild V3 calcified plaque. No proximal left subclavian artery stenosis despite plaque. Normal left vertebral artery origin. Tortuous left V1 segment. The left vertebral is mildly dominant and patent to the skull base. There is moderate calcified V3 segment plaque, but no significant stenosis. CTA HEAD Posterior circulation: There is severe bilateral V4 segment vertebral artery atherosclerosis and stenosis. The distal left V4 segment appears near occluded proximal to the vertebrobasilar junction (series 8, image 115). There is moderate to severe stenosis of the mid right V4 segment proximal to the right PICA. Both PICA remain patent. There is near occlusion of the proximal 3rd of the basilar artery on series 7, image 126 with  reconstitution of the vessel distally. There is additional mild to moderate stenosis just proximal to the basilar tip (series 9, image 106. Despite this the bilateral SCA and PCA origins are patent. There is a fetal type left PCA origin. The right PCOM is diminutive or absent. Left PCA branches are patent with mild to moderate P2 segment irregularity and stenosis. Right PCA branches are patent but more diminutive with mild generalized irregularity. Anterior circulation: Both ICA siphons are patent and heavily calcified. On the left there is mild to moderate supraclinoid stenosis. On the right there is moderate to severe supraclinoid stenosis (series 7, image 119). The left posterior communicating arteryorigin is normal. The carotid termini are patent. The MCA and ACA origins are patent. The left M1 appears dominant. The anterior communicating artery and bilateral ACA branches are patent. There is mild distal left A2 stenosis on series 12, image 23. The left MCA M1 segment and bifurcation are patent without stenosis. There is mild to moderate left MCA M2 branch irregularity with mild stenosis on series 10, image 24. No left MCA branch occlusion identified. There is severe stenosis of the right MCA mid M1 on series 11, image 20. The right MCA bifurcation than is patent, but there is a severe stenosis of the dominant posterior right MCA M2 branch on series 12, image 15. No right MCA branch occlusion is identified. Venous sinuses: Early contrast timing but the major dural venous sinuses appear grossly patent. Anatomic variants: Fetal type left PCA origin. Review of the MIP images confirms the above findings IMPRESSION: 1. There are several age indeterminate Severe intracranial stenoses: - Basilar Artery proximal 3rd (near occlusion). - Right MCA M1. - Right MCA posterior M2. This was discussed by telephone with Dr. Caryl PinaERIC LINDZEN on 02/12/2019 at 1825 hours, and I felt like MRI DWI imaging would be most helpful at this  point. 2. Otherwise negative for large vessel occlusion, but there are additional moderate to severe arterial stenoses: - Right ICA supraclinoid segment. - bilateral vertebral artery V4 segments. 3. Bilateral carotid bifurcation atherosclerosis resulting in 50-60% stenosis of both ICA origins. Electronically Signed   By: Odessa FlemingH  Hall M.D.   On: 02/12/2019 18:42   Ct Angio Neck W Or Wo Contrast  Result Date: 02/12/2019 CLINICAL DATA:  78 year old female with abnormal speech, expressive aphasia. EXAM: CT ANGIOGRAPHY HEAD AND NECK TECHNIQUE: Multidetector CT imaging of the head and neck was performed using the standard protocol during bolus administration of intravenous contrast. Multiplanar CT image reconstructions and  MIPs were obtained to evaluate the vascular anatomy. Carotid stenosis measurements (when applicable) are obtained utilizing NASCET criteria, using the distal internal carotid diameter as the denominator. CONTRAST:  OMNIPAQUE IOHEXOL 350 MG/ML SOLN COMPARISON:  Head CT without contrast 1618 hours today. FINDINGS: CTA NECK Skeleton: No acute osseous abnormality identified. Upper chest: No superior mediastinal lymphadenopathy. Normal enhancement of the visible central pulmonary arteries. Probable peribronchial atelectasis in the visible lungs. Other neck: Negative. Aortic arch: Calcified aortic atherosclerosis. Three vessel arch configuration. Right carotid system: Minimal calcified plaque at the right CCA origin without stenosis. Complex soft and calcified plaque at the right carotid bifurcation and affecting the right ICA origin with 50-60 % stenosis with respect to the distal vessel. Distal to the bulb the vessel is mildly tortuous but otherwise normal to the skull base. Left carotid system: Normal left CCA origin. Bulky calcified and lesser soft plaque at the left carotid bifurcation continuing into the left ICA origin and bulb resulting in stenosis up to 60 % with respect to the distal vessel  (series 9, image 149). Distal to the bulb the vessel is normal to the skull base. Vertebral arteries: Calcified plaque in the proximal right subclavian artery without significant stenosis and spares the right vertebral artery origin. Patent right vertebral artery to the skull base with only mild V3 calcified plaque. No proximal left subclavian artery stenosis despite plaque. Normal left vertebral artery origin. Tortuous left V1 segment. The left vertebral is mildly dominant and patent to the skull base. There is moderate calcified V3 segment plaque, but no significant stenosis. CTA HEAD Posterior circulation: There is severe bilateral V4 segment vertebral artery atherosclerosis and stenosis. The distal left V4 segment appears near occluded proximal to the vertebrobasilar junction (series 8, image 115). There is moderate to severe stenosis of the mid right V4 segment proximal to the right PICA. Both PICA remain patent. There is near occlusion of the proximal 3rd of the basilar artery on series 7, image 126 with reconstitution of the vessel distally. There is additional mild to moderate stenosis just proximal to the basilar tip (series 9, image 106. Despite this the bilateral SCA and PCA origins are patent. There is a fetal type left PCA origin. The right PCOM is diminutive or absent. Left PCA branches are patent with mild to moderate P2 segment irregularity and stenosis. Right PCA branches are patent but more diminutive with mild generalized irregularity. Anterior circulation: Both ICA siphons are patent and heavily calcified. On the left there is mild to moderate supraclinoid stenosis. On the right there is moderate to severe supraclinoid stenosis (series 7, image 119). The left posterior communicating arteryorigin is normal. The carotid termini are patent. The MCA and ACA origins are patent. The left M1 appears dominant. The anterior communicating artery and bilateral ACA branches are patent. There is mild distal  left A2 stenosis on series 12, image 23. The left MCA M1 segment and bifurcation are patent without stenosis. There is mild to moderate left MCA M2 branch irregularity with mild stenosis on series 10, image 24. No left MCA branch occlusion identified. There is severe stenosis of the right MCA mid M1 on series 11, image 20. The right MCA bifurcation than is patent, but there is a severe stenosis of the dominant posterior right MCA M2 branch on series 12, image 15. No right MCA branch occlusion is identified. Venous sinuses: Early contrast timing but the major dural venous sinuses appear grossly patent. Anatomic variants: Fetal type left PCA origin.  Review of the MIP images confirms the above findings IMPRESSION: 1. There are several age indeterminate Severe intracranial stenoses: - Basilar Artery proximal 3rd (near occlusion). - Right MCA M1. - Right MCA posterior M2. This was discussed by telephone with Dr. Caryl Pina on 02/12/2019 at 1825 hours, and I felt like MRI DWI imaging would be most helpful at this point. 2. Otherwise negative for large vessel occlusion, but there are additional moderate to severe arterial stenoses: - Right ICA supraclinoid segment. - bilateral vertebral artery V4 segments. 3. Bilateral carotid bifurcation atherosclerosis resulting in 50-60% stenosis of both ICA origins. Electronically Signed   By: Odessa Fleming M.D.   On: 02/12/2019 18:42   Ct Head Code Stroke Wo Contrast  Result Date: 02/12/2019 CLINICAL DATA:  Code stroke. 78 year old female with intermittent slurred speech since 1300 hours. EXAM: CT HEAD WITHOUT CONTRAST TECHNIQUE: Contiguous axial images were obtained from the base of the skull through the vertex without intravenous contrast. COMPARISON:  None. FINDINGS: Brain: Patchy and confluent bilateral cerebral white matter hypodensity. No acute intracranial hemorrhage identified. No midline shift, mass effect, or evidence of intracranial mass lesion. No ventriculomegaly. Mild  heterogeneity of the right basal ganglia. No definite cortical encephalomalacia. No cortically based acute infarct identified. Vascular: Extensive Calcified atherosclerosis at the skull base. No suspicious intracranial vascular hyperdensity. Skull: No acute osseous abnormality identified. Sinuses/Orbits: Paranasal Visualized paranasal sinuses and mastoids well pneumatized. Other: No acute orbit or scalp soft tissue findings. ASPECTS [facility] Stroke Program Early CT Score) - Ganglionic level infarction (caudate, lentiform nuclei, internal capsule, insula, M1-M3 cortex): 7 - Supraganglionic infarction (M4-M6 cortex): 3 Total score (0-10 with 10 being normal): 10 IMPRESSION: 1. No acute cortically based infarct or acute intracranial hemorrhage identified. ASPECTS 10. 2. Moderately advanced chronic cerebral white matter disease, most commonly due to chronic small vessel ischemia. Study discussed by telephone with Dr. Fleet Contras LITTLE on 02/12/2019 at 16:33 . Electronically Signed   By: Odessa Fleming M.D.   On: 02/12/2019 16:33    EKG: Independently reviewed. Sinus rhythm, LVH with repolarization abnormality.   Assessment/Plan   1. Expressive aphasia  - Presents with expressive aphasia, onset ~13:30, waxed and waned and has resolved completely by time of admission  - Head CT with no acute findings, CTA head & neck with several sites of severe age-indeterminate stenoses  - Neurology is consulting and much appreciated  - Check MRI brain, echocardiogram, fasting lipids, and A1c  - Continue frequent neuro checks, consult with PT/OT/SLP, keep NPO pending swallow screen  - Permit HTN up to SBP 220, continue statin, she is on ASA and Plavix already will continue   2. Hypertension with hypertensive urgency  - SBP was 238 initially, treated initially with nicardipine infusion  - Hold her scheduled antihypertensives with concern for acute ischemic CVA and severe stenoses on CTA, use labetalol IVP's for SBP >220    3.  Hyperlipidemia  - Continue statin and omega-3's, check fasting lipids, consider increasing intensity but monitor the slightly elevated transaminases    PPE: Mask, face shield. Patient wearing mask.  DVT prophylaxis: Lovenox  Code Status: Full  Family Communication: Husband updated at bedside Consults called: neurology Admission status: Observation     Briscoe Deutscher, MD [facility] Pager [phone]  If 7PM-7AM, please contact night-coverage [email or web address] Password [email or web address]  02/12/2019, 8:10 PM

## 2019-02-13 ENCOUNTER — Observation Stay (HOSPITAL_COMMUNITY): Payer: Medicare Other

## 2019-02-13 ENCOUNTER — Observation Stay (HOSPITAL_BASED_OUTPATIENT_CLINIC_OR_DEPARTMENT_OTHER): Payer: Medicare Other

## 2019-02-13 DIAGNOSIS — I2510 Atherosclerotic heart disease of native coronary artery without angina pectoris: Secondary | ICD-10-CM

## 2019-02-13 DIAGNOSIS — Z1159 Encounter for screening for other viral diseases: Secondary | ICD-10-CM | POA: Diagnosis not present

## 2019-02-13 DIAGNOSIS — E1151 Type 2 diabetes mellitus with diabetic peripheral angiopathy without gangrene: Secondary | ICD-10-CM | POA: Diagnosis present

## 2019-02-13 DIAGNOSIS — G459 Transient cerebral ischemic attack, unspecified: Secondary | ICD-10-CM

## 2019-02-13 DIAGNOSIS — R4701 Aphasia: Secondary | ICD-10-CM

## 2019-02-13 DIAGNOSIS — R740 Nonspecific elevation of levels of transaminase and lactic acid dehydrogenase [LDH]: Secondary | ICD-10-CM | POA: Diagnosis present

## 2019-02-13 DIAGNOSIS — D72829 Elevated white blood cell count, unspecified: Secondary | ICD-10-CM | POA: Diagnosis not present

## 2019-02-13 DIAGNOSIS — E119 Type 2 diabetes mellitus without complications: Secondary | ICD-10-CM | POA: Diagnosis not present

## 2019-02-13 DIAGNOSIS — I160 Hypertensive urgency: Secondary | ICD-10-CM | POA: Diagnosis present

## 2019-02-13 DIAGNOSIS — Z6828 Body mass index (BMI) 28.0-28.9, adult: Secondary | ICD-10-CM | POA: Diagnosis not present

## 2019-02-13 DIAGNOSIS — R270 Ataxia, unspecified: Secondary | ICD-10-CM | POA: Diagnosis present

## 2019-02-13 DIAGNOSIS — I6601 Occlusion and stenosis of right middle cerebral artery: Secondary | ICD-10-CM | POA: Diagnosis present

## 2019-02-13 DIAGNOSIS — E876 Hypokalemia: Secondary | ICD-10-CM | POA: Diagnosis present

## 2019-02-13 DIAGNOSIS — Z955 Presence of coronary angioplasty implant and graft: Secondary | ICD-10-CM | POA: Diagnosis not present

## 2019-02-13 DIAGNOSIS — G4733 Obstructive sleep apnea (adult) (pediatric): Secondary | ICD-10-CM | POA: Diagnosis present

## 2019-02-13 DIAGNOSIS — Z888 Allergy status to other drugs, medicaments and biological substances status: Secondary | ICD-10-CM | POA: Diagnosis not present

## 2019-02-13 DIAGNOSIS — I6523 Occlusion and stenosis of bilateral carotid arteries: Secondary | ICD-10-CM

## 2019-02-13 DIAGNOSIS — E663 Overweight: Secondary | ICD-10-CM | POA: Diagnosis present

## 2019-02-13 DIAGNOSIS — I351 Nonrheumatic aortic (valve) insufficiency: Secondary | ICD-10-CM | POA: Diagnosis present

## 2019-02-13 DIAGNOSIS — I10 Essential (primary) hypertension: Secondary | ICD-10-CM | POA: Diagnosis present

## 2019-02-13 DIAGNOSIS — I6381 Other cerebral infarction due to occlusion or stenosis of small artery: Secondary | ICD-10-CM | POA: Diagnosis present

## 2019-02-13 DIAGNOSIS — E1165 Type 2 diabetes mellitus with hyperglycemia: Secondary | ICD-10-CM | POA: Diagnosis present

## 2019-02-13 DIAGNOSIS — R29700 NIHSS score 0: Secondary | ICD-10-CM | POA: Diagnosis present

## 2019-02-13 DIAGNOSIS — I672 Cerebral atherosclerosis: Secondary | ICD-10-CM | POA: Diagnosis present

## 2019-02-13 DIAGNOSIS — I771 Stricture of artery: Secondary | ICD-10-CM | POA: Diagnosis present

## 2019-02-13 DIAGNOSIS — E785 Hyperlipidemia, unspecified: Secondary | ICD-10-CM | POA: Diagnosis present

## 2019-02-13 LAB — COMPREHENSIVE METABOLIC PANEL
ALT: 63 U/L — ABNORMAL HIGH (ref 0–44)
AST: 46 U/L — ABNORMAL HIGH (ref 15–41)
Albumin: 3.6 g/dL (ref 3.5–5.0)
Alkaline Phosphatase: 44 U/L (ref 38–126)
Anion gap: 13 (ref 5–15)
BUN: 13 mg/dL (ref 8–23)
CO2: 27 mmol/L (ref 22–32)
Calcium: 9.5 mg/dL (ref 8.9–10.3)
Chloride: 99 mmol/L (ref 98–111)
Creatinine, Ser: 0.56 mg/dL (ref 0.44–1.00)
GFR calc Af Amer: >60 mL/min (ref >60)
GFR calc non Af Amer: >60 mL/min (ref >60)
Glucose, Bld: 240 mg/dL — ABNORMAL HIGH (ref 70–99)
Potassium: 4.8 mmol/L (ref 3.5–5.1)
Sodium: 139 mmol/L (ref 135–145)
Total Bilirubin: 0.9 mg/dL (ref 0.3–1.2)
Total Protein: 6.7 g/dL (ref 6.5–8.1)

## 2019-02-13 LAB — GLUCOSE, CAPILLARY
Glucose-Capillary: 112 mg/dL — ABNORMAL HIGH (ref 70–99)
Glucose-Capillary: 209 mg/dL — ABNORMAL HIGH (ref 70–99)
Glucose-Capillary: 171 mg/dL — ABNORMAL HIGH (ref 70–99)
Glucose-Capillary: 227 mg/dL — ABNORMAL HIGH (ref 70–99)

## 2019-02-13 LAB — LIPID PANEL
Cholesterol: 226 mg/dL — ABNORMAL HIGH (ref 0–200)
HDL: 62 mg/dL (ref >40)
LDL Cholesterol: 122 mg/dL — ABNORMAL HIGH (ref 0–99)
Total CHOL/HDL Ratio: 3.6 RATIO
Triglycerides: 209 mg/dL — ABNORMAL HIGH (ref <150)
VLDL: 42 mg/dL — ABNORMAL HIGH (ref 0–40)

## 2019-02-13 LAB — HEMOGLOBIN A1C
Hgb A1c MFr Bld: 7.2 % — ABNORMAL HIGH (ref 4.8–5.6)
Mean Plasma Glucose: 159.94 mg/dL

## 2019-02-13 MED ORDER — CLOPIDOGREL BISULFATE 75 MG PO TABS
75.0000 mg | ORAL_TABLET | Freq: Every day | ORAL | Status: DC
  Administered 2019-02-13 – 2019-02-14 (×3): 75 mg via ORAL
  Filled 2019-02-13 (×3): qty 1

## 2019-02-13 MED ORDER — SENNOSIDES-DOCUSATE SODIUM 8.6-50 MG PO TABS: 1.0000 | ORAL_TABLET | Freq: Every evening | ORAL | Status: DC | PRN

## 2019-02-13 MED ORDER — ASPIRIN 325 MG PO TABS: 325.0000 mg | ORAL_TABLET | Freq: Every day | ORAL | Status: DC

## 2019-02-13 MED ORDER — ASPIRIN 325 MG PO TABS
325.0000 mg | ORAL_TABLET | Freq: Every day | ORAL | Status: DC
  Administered 2019-02-13 – 2019-02-16 (×3): 325 mg via ORAL
  Filled 2019-02-13 (×3): qty 1

## 2019-02-13 MED ORDER — ACETAMINOPHEN 325 MG PO TABS
650.0000 mg | ORAL_TABLET | ORAL | Status: DC | PRN
  Administered 2019-02-15: 23:00:00 650 mg via ORAL
  Filled 2019-02-13: qty 2

## 2019-02-13 MED ORDER — MAGNESIUM GLUCONATE 500 MG PO TABS
500.0000 mg | ORAL_TABLET | Freq: Once | ORAL | Status: AC
  Administered 2019-02-13: 500 mg via ORAL
  Filled 2019-02-13: qty 1

## 2019-02-13 MED ORDER — ASPIRIN 300 MG RE SUPP: 300.0000 mg | Freq: Every day | RECTAL | Status: DC

## 2019-02-13 MED ORDER — OMEGA-3-ACID ETHYL ESTERS 1 G PO CAPS
1.0000 g | ORAL_CAPSULE | Freq: Two times a day (BID) | ORAL | Status: DC
  Administered 2019-02-13 – 2019-02-16 (×6): 1 g via ORAL
  Filled 2019-02-13 (×6): qty 1

## 2019-02-13 MED ORDER — ACETAMINOPHEN 650 MG RE SUPP: 650.0000 mg | RECTAL | Status: DC | PRN

## 2019-02-13 MED ORDER — STROKE: EARLY STAGES OF RECOVERY BOOK
Freq: Once | Status: AC
  Administered 2019-02-13: 10:00:00
  Filled 2019-02-13: qty 1

## 2019-02-13 MED ORDER — ENOXAPARIN SODIUM 40 MG/0.4ML ~~LOC~~ SOLN
40.0000 mg | Freq: Every day | SUBCUTANEOUS | Status: DC
  Administered 2019-02-13 – 2019-02-14 (×2): 40 mg via SUBCUTANEOUS
  Filled 2019-02-13 (×2): qty 0.4

## 2019-02-13 MED ORDER — ESCITALOPRAM OXALATE 10 MG PO TABS
10.0000 mg | ORAL_TABLET | Freq: Every day | ORAL | Status: DC
  Administered 2019-02-13 – 2019-02-16 (×3): 10 mg via ORAL
  Filled 2019-02-13 (×3): qty 1

## 2019-02-13 MED ORDER — SIMVASTATIN 20 MG PO TABS
20.0000 mg | ORAL_TABLET | Freq: Every day | ORAL | Status: DC
  Administered 2019-02-13 – 2019-02-14 (×2): 20 mg via ORAL
  Filled 2019-02-13 (×2): qty 1

## 2019-02-13 MED ORDER — POTASSIUM CHLORIDE CRYS ER 20 MEQ PO TBCR
20.0000 meq | EXTENDED_RELEASE_TABLET | Freq: Two times a day (BID) | ORAL | Status: AC
  Administered 2019-02-13: 20 meq via ORAL
  Filled 2019-02-13 (×2): qty 1

## 2019-02-13 MED ORDER — ACETAMINOPHEN 160 MG/5ML PO SOLN: 650.0000 mg | ORAL | Status: DC | PRN

## 2019-02-13 NOTE — Progress Notes (Signed)
Lab called RN, stated no specimen was collected for urine culture. RN got confirm from [place], MD that no need for urine culture. Cancelled order.

## 2019-02-13 NOTE — Progress Notes (Signed)
PROGRESS NOTE    Margaret Butler  WUJ:[id number] DOB: 10-Nov-1940 DOA: 02/12/2019 PCP: System, Pcp Not In   Brief Narrative:78 y.o. female with medical history significant for hypertension and hyperlipidemia, now presenting to the emergency department for evaluation of speech difficulty.  Patient reports that she went to bed in her usual state last night, felt a little more fatigued than usual this morning, but was able to go about her usual activities, had been with her husband, they had lunch together and everything seemed to be normal, but at approximately 1:30 PM, she developed difficulty with her speech.  She seemed to have difficulty finding the right words to use.  She laid down for a nap, continued to have this problem when she woke up, and came to the ED for evaluation.  She denies any headache, change in vision or hearing, or focal numbness or weakness.  The patient reports that she had similar symptoms in the 1980s that resolved very quickly.  Patient's husband followed me into the hall after the exam to note that the patient has had some problems over months to years with using the wrong word occasionally or forgetting the name of a family member or friend briefly, but none of these events are very brief and the patient becomes upset when her husband expresses concern about it.  She denies any recent fevers, chills, cough, shortness of breath, or sick contacts.  ED Course: Upon arrival to the ED, patient is found to be afebrile, saturating well on room air, and hypertensive to 238/82.  EKG features sinus rhythm with LVH and repolarization abnormality.  Noncontrast head CT was negative for acute intracranial abnormality.  Chemistry panel is notable for glucose of 176 and slight elevation in transaminases.  CBC is unremarkable.  CTA head and neck reveal severe age indeterminant stenoses in several areas but otherwise negative for large vessel occlusion.  Blood pressure was treated with nicardipine  initially.  Neurology was consulted by the ED physician and recommends a medical admission for further evaluation and management  Assessment & Plan:   Principal Problem:   Expressive aphasia Active Problems:   Hypertension   Hypertensive urgency   Elevated transaminase level   Hyperlipidemia    1. Expressive aphasia  - Presents with expressive aphasia, which had been resolved completely by the time she came to the hospital.  - Head CT with no acute findings, CTA head & neck with several sites of severe age-indeterminate stenoses  - Neurology much appreciated - -MRI of the brain shows no acute stroke -Echocardiogram ef 60 to 65 % -Carotid ultrasound 60 to 79% on the left and 40 to 59 %on the right -Fasting lipid profile LDL 122 and hemoglobin A1c is 7.2 PT recommends HHPT We will continue aspirin and Plavix for now. -Patient at high risk for imminent stroke with Left carotid stenosis and needs surgery asap  2. Hypertension with hypertensive urgency  - SBP was 238 initially, treated initially with nicardipine infusion  -Blood pressure down to 147/55  3. Hyperlipidemia  - Continue statin and omega-3's  4 new onset type 2 dm patient does not want any therapy started until she speaks to her pcp hba1c 7.2   DVT prophylaxis: Lovenox  Code Status: Full  Family Communication: Attempted to call husband many times he did not pick up the phone dw son  Consults called: neurology and vascular surgery   Estimated body mass index is 26.7 kg/m as calculated from the following:   Height as  of this encounter:  (1.6 m).   Weight as of this encounter: 68.4 kg.    Subjective: Patient resting in bed ordering food on the phone to the cafeteria speech fluent reports she is very hungry no other weakness anxious to go home  Objective: Vitals:   02/13/19 0100 02/13/19 0200 02/13/19 0249 02/13/19 0500  BP: (!) 151/52  (!) 161/57 (!) 147/55  Pulse: 72  70 69  Resp: Temp:      TempSrc:      SpO2:      Weight:      Height:        Intake/Output Summary (Last 24 hours) at 02/13/2019 1224 Last data filed at 02/13/2019 0650 Gross per 24 hour  Intake 382.53 ml  Output 900 ml  Net -517.47 ml   Filed Weights   02/12/19 1657  Weight: 68.4 kg    Examination:  General exam: Appears calm and comfortable  Respiratory system: Clear to auscultation. Respiratory effort normal. Cardiovascular system: S1 & S2 heard, RRR. No JVD, murmurs, rubs, gallops or clicks. No pedal edema. Gastrointestinal system: Abdomen is nondistended, soft and nontender. No organomegaly or masses felt. Normal bowel sounds heard. Central nervous system: Alert and oriented. No focal neurological deficits.speech back to baseline Extremities: Symmetric 5 x 5 power. Skin: No rashes, lesions or ulcers Psychiatry: Judgement and insight appear normal. Mood & affect appropriate.     Data Reviewed: I have personally reviewed following labs and imaging studies  CBC: Recent Labs  Lab 02/12/19 1623  WBC 10.5  NEUTROABS 6.8  HGB 13.0  HCT 40.2  MCV 84.6  PLT 248   Basic Metabolic Panel: Recent Labs  Lab 02/12/19 1623 02/13/19 1045  NA 138 139  K 3.4* 4.8  CL 100 99  CO2 26 27  GLUCOSE 176* 240*  BUN 16 13  CREATININE 0.41* 0.56  CALCIUM 9.1 9.5   GFR: Estimated Creatinine Clearance: 55.5 mL/min (by C-G formula based on SCr of 0.56 mg/dL). Liver Function Tests: Recent Labs  Lab 02/12/19 1623 02/13/19 1045  AST 42* 46*  ALT 68* 63*  ALKPHOS 55 44  BILITOT 0.4 0.9  PROT 6.8 6.7  ALBUMIN 3.8 3.6   No results for input(s): LIPASE, AMYLASE in the last 168 hours. No results for input(s): AMMONIA in the last 168 hours. Coagulation Profile: Recent Labs  Lab 02/12/19 1623  INR 0.9   Cardiac Enzymes: No results for input(s): CKTOTAL, CKMB, CKMBINDEX, TROPONINI in the last 168 hours. BNP (last 3 results) No results for input(s): PROBNP in the last 8760  hours. HbA1C: Recent Labs    02/13/19 1045  HGBA1C 7.2*   CBG: Recent Labs  Lab 02/12/19 1614 02/12/19 2352 02/13/19 0627  GLUCAP 184* 237* 112*   Lipid Profile: Recent Labs    02/13/19 1045  CHOL 226*  HDL 62  LDLCALC 122*  TRIG 209*  CHOLHDL 3.6   Thyroid Function Tests: No results for input(s): TSH, T4TOTAL, FREET4, T3FREE, THYROIDAB in the last 72 hours. Anemia Panel: No results for input(s): VITAMINB12, FOLATE, FERRITIN, TIBC, IRON, RETICCTPCT in the last 72 hours. Sepsis Labs: No results for input(s): PROCALCITON, LATICACIDVEN in the last 168 hours.  Recent Results (from the past 240 hour(s))  SARS Coronavirus 2 (Hosp order,Performed in [facility] lab via [employer] [facility])     Status: None   Collection Time: 02/12/19  4:33 PM   Specimen: Dry Nasal Swab ([employer] [facility] Now)  Result  Value Ref Range Status   SARS Coronavirus 2 ([employer] Now) NEGATIVE NEGATIVE Final    Comment: (NOTE) SARS-CoV-2 target nucleic acids are NOT DETECTED. The SARS-CoV-2 RNA is generally detectable in upper and lower respiratory specimens during the acute phase of infection.  Negativeresults do not preclude SARS-CoV-2 infection, do not rule out coinfections with other pathogens, and should not be used as the  sole basis for treatment or other patient management decisions.  Negative results must be combined with clinical observations, patient history, and epidemiological information. The expected result is Negative. Fact Sheet for Patients: http://www.graves-ford.org/https://www.fda.gov/media/136524/download Fact Sheet for Healthcare Providers: EnviroConcern.sihttps://www.fda.gov/media/136523/download This test is not yet approved or cleared by the [place] FDA and  has been authorized for detection and/or diagnosis of SARS-CoV-2 by FDA under an Emergency Use Authorization (EUA).  This EUA will remain in effect (meaning this test can be used) for the duration of  the COVID19 declaration under Section 5 64(b)(1) of the Act, 21  U.S.C.  section [id number] 3(b)(1), unless the authorization is terminated or revoked sooner. Performed at [facility] [place], [address] [place], [place] [id number]          Radiology Studies: Ct Angio Head W Or Wo Contrast  Result Date: 02/12/2019 CLINICAL DATA:  78 year old female with abnormal speech, expressive aphasia. EXAM: CT ANGIOGRAPHY HEAD AND NECK TECHNIQUE: Multidetector CT imaging of the head and neck was performed using the standard protocol during bolus administration of intravenous contrast. Multiplanar CT image reconstructions and MIPs were obtained to evaluate the vascular anatomy. Carotid stenosis measurements (when applicable) are obtained utilizing NASCET criteria, using the distal internal carotid diameter as the denominator. CONTRAST:  100mL OMNIPAQUE IOHEXOL 350 MG/ML SOLN COMPARISON:  Head CT without contrast 1618 hours today. FINDINGS: CTA NECK Skeleton: No acute osseous abnormality identified. Upper chest: No superior mediastinal lymphadenopathy. Normal enhancement of the visible central pulmonary arteries. Probable peribronchial atelectasis in the visible lungs. Other neck: Negative. Aortic arch: Calcified aortic atherosclerosis. Three vessel arch configuration. Right carotid system: Minimal calcified plaque at the right CCA origin without stenosis. Complex soft and calcified plaque at the right carotid bifurcation and affecting the right ICA origin with 50-60 % stenosis with respect to the distal vessel. Distal to the bulb the vessel is mildly tortuous but otherwise normal to the skull base. Left carotid system: Normal left CCA origin. Bulky calcified and lesser soft plaque at the left carotid bifurcation continuing into the left ICA origin and bulb resulting in stenosis up to 60 % with respect to the distal vessel (series 9, image 149). Distal to the bulb the vessel is normal to the skull base. Vertebral arteries: Calcified plaque in the proximal right subclavian  artery without significant stenosis and spares the right vertebral artery origin. Patent right vertebral artery to the skull base with only mild V3 calcified plaque. No proximal left subclavian artery stenosis despite plaque. Normal left vertebral artery origin. Tortuous left V1 segment. The left vertebral is mildly dominant and patent to the skull base. There is moderate calcified V3 segment plaque, but no significant stenosis. CTA HEAD Posterior circulation: There is severe bilateral V4 segment vertebral artery atherosclerosis and stenosis. The distal left V4 segment appears near occluded proximal to the vertebrobasilar junction (series 8, image 115). There is moderate to severe stenosis of the mid right V4 segment proximal to the right PICA. Both PICA remain patent. There is near occlusion of the proximal 3rd of the basilar artery on series 7, image  126 with reconstitution of the vessel distally. There is additional mild to moderate stenosis just proximal to the basilar tip (series 9, image 106. Despite this the bilateral SCA and PCA origins are patent. There is a fetal type left PCA origin. The right PCOM is diminutive or absent. Left PCA branches are patent with mild to moderate P2 segment irregularity and stenosis. Right PCA branches are patent but more diminutive with mild generalized irregularity. Anterior circulation: Both ICA siphons are patent and heavily calcified. On the left there is mild to moderate supraclinoid stenosis. On the right there is moderate to severe supraclinoid stenosis (series 7, image 119). The left posterior communicating arteryorigin is normal. The carotid termini are patent. The MCA and ACA origins are patent. The left M1 appears dominant. The anterior communicating artery and bilateral ACA branches are patent. There is mild distal left A2 stenosis on series 12, image 23. The left MCA M1 segment and bifurcation are patent without stenosis. There is mild to moderate left MCA M2  branch irregularity with mild stenosis on series 10, image 24. No left MCA branch occlusion identified. There is severe stenosis of the right MCA mid M1 on series 11, image 20. The right MCA bifurcation than is patent, but there is a severe stenosis of the dominant posterior right MCA M2 branch on series 12, image 15. No right MCA branch occlusion is identified. Venous sinuses: Early contrast timing but the major dural venous sinuses appear grossly patent. Anatomic variants: Fetal type left PCA origin. Review of the MIP images confirms the above findings IMPRESSION: 1. There are several age indeterminate Severe intracranial stenoses: - Basilar Artery proximal 3rd (near occlusion). - Right MCA M1. - Right MCA posterior M2. This was discussed by telephone with Dr. Caryl Pina on 02/12/2019 at 1825 hours, and I felt like MRI DWI imaging would be most helpful at this point. 2. Otherwise negative for large vessel occlusion, but there are additional moderate to severe arterial stenoses: - Right ICA supraclinoid segment. - bilateral vertebral artery V4 segments. 3. Bilateral carotid bifurcation atherosclerosis resulting in 50-60% stenosis of both ICA origins. Electronically Signed   By: Odessa Fleming M.D.   On: 02/12/2019 18:42   Ct Angio Neck W Or Wo Contrast  Result Date: 02/12/2019 CLINICAL DATA:  78 year old female with abnormal speech, expressive aphasia. EXAM: CT ANGIOGRAPHY HEAD AND NECK TECHNIQUE: Multidetector CT imaging of the head and neck was performed using the standard protocol during bolus administration of intravenous contrast. Multiplanar CT image reconstructions and MIPs were obtained to evaluate the vascular anatomy. Carotid stenosis measurements (when applicable) are obtained utilizing NASCET criteria, using the distal internal carotid diameter as the denominator. CONTRAST:  OMNIPAQUE IOHEXOL 350 MG/ML SOLN COMPARISON:  Head CT without contrast 1618 hours today. FINDINGS: CTA NECK Skeleton: No acute  osseous abnormality identified. Upper chest: No superior mediastinal lymphadenopathy. Normal enhancement of the visible central pulmonary arteries. Probable peribronchial atelectasis in the visible lungs. Other neck: Negative. Aortic arch: Calcified aortic atherosclerosis. Three vessel arch configuration. Right carotid system: Minimal calcified plaque at the right CCA origin without stenosis. Complex soft and calcified plaque at the right carotid bifurcation and affecting the right ICA origin with 50-60 % stenosis with respect to the distal vessel. Distal to the bulb the vessel is mildly tortuous but otherwise normal to the skull base. Left carotid system: Normal left CCA origin. Bulky calcified and lesser soft plaque at the left carotid bifurcation continuing into the left ICA origin and bulb  resulting in stenosis up to 60 % with respect to the distal vessel (series 9, image 149). Distal to the bulb the vessel is normal to the skull base. Vertebral arteries: Calcified plaque in the proximal right subclavian artery without significant stenosis and spares the right vertebral artery origin. Patent right vertebral artery to the skull base with only mild V3 calcified plaque. No proximal left subclavian artery stenosis despite plaque. Normal left vertebral artery origin. Tortuous left V1 segment. The left vertebral is mildly dominant and patent to the skull base. There is moderate calcified V3 segment plaque, but no significant stenosis. CTA HEAD Posterior circulation: There is severe bilateral V4 segment vertebral artery atherosclerosis and stenosis. The distal left V4 segment appears near occluded proximal to the vertebrobasilar junction (series 8, image 115). There is moderate to severe stenosis of the mid right V4 segment proximal to the right PICA. Both PICA remain patent. There is near occlusion of the proximal 3rd of the basilar artery on series 7, image 126 with reconstitution of the vessel distally. There is  additional mild to moderate stenosis just proximal to the basilar tip (series 9, image 106. Despite this the bilateral SCA and PCA origins are patent. There is a fetal type left PCA origin. The right PCOM is diminutive or absent. Left PCA branches are patent with mild to moderate P2 segment irregularity and stenosis. Right PCA branches are patent but more diminutive with mild generalized irregularity. Anterior circulation: Both ICA siphons are patent and heavily calcified. On the left there is mild to moderate supraclinoid stenosis. On the right there is moderate to severe supraclinoid stenosis (series 7, image 119). The left posterior communicating arteryorigin is normal. The carotid termini are patent. The MCA and ACA origins are patent. The left M1 appears dominant. The anterior communicating artery and bilateral ACA branches are patent. There is mild distal left A2 stenosis on series 12, image 23. The left MCA M1 segment and bifurcation are patent without stenosis. There is mild to moderate left MCA M2 branch irregularity with mild stenosis on series 10, image 24. No left MCA branch occlusion identified. There is severe stenosis of the right MCA mid M1 on series 11, image 20. The right MCA bifurcation than is patent, but there is a severe stenosis of the dominant posterior right MCA M2 branch on series 12, image 15. No right MCA branch occlusion is identified. Venous sinuses: Early contrast timing but the major dural venous sinuses appear grossly patent. Anatomic variants: Fetal type left PCA origin. Review of the MIP images confirms the above findings IMPRESSION: 1. There are several age indeterminate Severe intracranial stenoses: - Basilar Artery proximal 3rd (near occlusion). - Right MCA M1. - Right MCA posterior M2. This was discussed by telephone with Dr. Caryl PinaERIC LINDZEN on 02/12/2019 at 1825 hours, and I felt like MRI DWI imaging would be most helpful at this point. 2. Otherwise negative for large vessel  occlusion, but there are additional moderate to severe arterial stenoses: - Right ICA supraclinoid segment. - bilateral vertebral artery V4 segments. 3. Bilateral carotid bifurcation atherosclerosis resulting in 50-60% stenosis of both ICA origins. Electronically Signed   By: Odessa FlemingH  Hall M.D.   On: 02/12/2019 18:42   Mr Brain Wo Contrast (neuro Protocol)  Result Date: 02/13/2019 CLINICAL DATA:  Acute presentation yesterday with intermittent slurred speech. EXAM: MRI HEAD WITHOUT CONTRAST TECHNIQUE: Multiplanar, multiecho pulse sequences of the brain and surrounding structures were obtained without intravenous contrast. COMPARISON:  CT studies 02/12/2019 FINDINGS: Brain:  Diffusion imaging does not show any acute or subacute infarction. Brainstem and cerebellum are normal. Cerebral hemispheres show moderate changes of chronic small vessel disease affecting the deep and subcortical white matter. No large vessel territory infarction. No mass lesion, hemorrhage, hydrocephalus or extra-axial collection. Few small foci of hemosiderin deposition associated with some of the old small vessel infarctions. Vascular: Major vessels at the base of the brain show flow. Skull and upper cervical spine: Negative Sinuses/Orbits: Clear/normal Other: None IMPRESSION: No acute finding by MRI. Moderate chronic appearing small vessel ischemic changes of the cerebral hemispheric white matter Electronically Signed   By: Paulina FusiMark  Shogry M.D.   On: 02/13/2019 08:32   Ct Head Code Stroke Wo Contrast  Result Date: 02/12/2019 CLINICAL DATA:  Code stroke. 78 year old female with intermittent slurred speech since 1300 hours. EXAM: CT HEAD WITHOUT CONTRAST TECHNIQUE: Contiguous axial images were obtained from the base of the skull through the vertex without intravenous contrast. COMPARISON:  None. FINDINGS: Brain: Patchy and confluent bilateral cerebral white matter hypodensity. No acute intracranial hemorrhage identified. No midline shift, mass  effect, or evidence of intracranial mass lesion. No ventriculomegaly. Mild heterogeneity of the right basal ganglia. No definite cortical encephalomalacia. No cortically based acute infarct identified. Vascular: Extensive Calcified atherosclerosis at the skull base. No suspicious intracranial vascular hyperdensity. Skull: No acute osseous abnormality identified. Sinuses/Orbits: Paranasal Visualized paranasal sinuses and mastoids well pneumatized. Other: No acute orbit or scalp soft tissue findings. ASPECTS [facility]([place] Stroke Program Early CT Score) - Ganglionic level infarction (caudate, lentiform nuclei, internal capsule, insula, M1-M3 cortex): 7 - Supraganglionic infarction (M4-M6 cortex): 3 Total score (0-10 with 10 being normal): 10 IMPRESSION: 1. No acute cortically based infarct or acute intracranial hemorrhage identified. ASPECTS 10. 2. Moderately advanced chronic cerebral white matter disease, most commonly due to chronic small vessel ischemia. Study discussed by telephone with Dr. Fleet ContrasACHEL LITTLE on 02/12/2019 at 16:33 . Electronically Signed   By: Odessa FlemingH  Hall M.D.   On: 02/12/2019 16:33   [place] Carotid  Result Date: 02/13/2019 Carotid Arterial Duplex Study Indications:  Speech disturbance, Weakness and 50-60% ICA stenosis by CTA. Risk Factors: Hypertension, hyperlipidemia. Performing Technologist: Sherren Kernsandace Kanady RVS  Examination Guidelines: A complete evaluation includes B-mode imaging, spectral Doppler, color Doppler, and power Doppler as needed of all accessible portions of each vessel. Bilateral testing is considered an integral part of a complete examination. Limited examinations for reoccurring indications may be performed as noted.  Right Carotid Findings: +----------+--------+--------+--------+--------+------------------+             PSV cm/s EDV cm/s Stenosis Describe Comments            +----------+--------+--------+--------+--------+------------------+  CCA Prox   103      16                          intimal thickening  +----------+--------+--------+--------+--------+------------------+  CCA Distal 76       16                         intimal thickening  +----------+--------+--------+--------+--------+------------------+  ICA Prox   203      47                calcific Shadowing           +----------+--------+--------+--------+--------+------------------+  ICA Mid    152      37                                             +----------+--------+--------+--------+--------+------------------+  ICA Distal 133      26                                             +----------+--------+--------+--------+--------+------------------+  ECA        203      9                                              +----------+--------+--------+--------+--------+------------------+ +----------+--------+-------+--------+-------------------+             PSV cm/s EDV cms Describe Arm Pressure (mmHG)  +----------+--------+-------+--------+-------------------+  Subclavian 59                                             +----------+--------+-------+--------+-------------------+ +---------+--------+--+--------+--+  Vertebral PSV cm/s 50 EDV cm/s 15  +---------+--------+--+--------+--+  Left Carotid Findings: +----------+--------+--------+--------+--------+------------------+             PSV cm/s EDV cm/s Stenosis Describe Comments            +----------+--------+--------+--------+--------+------------------+  CCA Prox   57       13                         intimal thickening  +----------+--------+--------+--------+--------+------------------+  CCA Distal 56       12                         intimal thickening  +----------+--------+--------+--------+--------+------------------+  ICA Prox   273      72                calcific Shadowing           +----------+--------+--------+--------+--------+------------------+  ICA Mid    212      33                                             +----------+--------+--------+--------+--------+------------------+  ICA  Distal 128      33                                             +----------+--------+--------+--------+--------+------------------+  ECA        24       6                                              +----------+--------+--------+--------+--------+------------------+ +----------+--------+--------+--------+-------------------+  Subclavian PSV cm/s EDV cm/s Describe Arm Pressure (mmHG)  +----------+--------+--------+--------+-------------------+             152                                             +----------+--------+--------+--------+-------------------+ +---------+--------+--+--------+-+  Vertebral PSV cm/s 60 EDV cm/s 9  +---------+--------+--+--------+-+  Summary: Right Carotid: Velocities in the right ICA are consistent with a 40-59%                stenosis. Left Carotid: Velocities in the left ICA are consistent with a 60-79% stenosis. Vertebrals:  Bilateral vertebral arteries demonstrate antegrade flow. Subclavians: Normal flow hemodynamics were seen in bilateral subclavian              arteries. *See table(s) above for measurements and observations.     Preliminary         Scheduled Meds:  aspirin  300 mg Rectal Daily   Or   aspirin  325 mg Oral Daily   clopidogrel  75 mg Oral Q2000   enoxaparin (LOVENOX) injection  40 mg Subcutaneous Daily   escitalopram  10 mg Oral QPC breakfast   insulin aspart  0-5 Units Subcutaneous QHS   insulin aspart  0-9 Units Subcutaneous TID WC   omega-3 acid ethyl esters  1 g Oral BID   potassium chloride  20 mEq Oral BID   simvastatin  20 mg Oral q1800   Continuous Infusions:  sodium chloride 500 mL (02/12/19 1725)     LOS: 0 days     Georgette Shell, MD [facility]  If 7PM-7AM, please contact night-coverage [email or web address] Password [email or web address] 02/13/2019, 12:24 PM \

## 2019-02-13 NOTE — Evaluation (Signed)
Clinical/Bedside Swallow Evaluation Patient Details  Name: Margaret Butler MRN: [id number] Date of Birth: Mar 08, 1941  Today's Date: 02/13/2019 Time: SLP Start Time (ACUTE ONLY): 1121 SLP Stop Time (ACUTE ONLY): 1126 SLP Time Calculation (min) (ACUTE ONLY): 5 min  Past Medical History:  Past Medical History:  Diagnosis Date  . Hyperlipidemia   . Hypertension    Past Surgical History: History reviewed. No pertinent surgical history. HPI:  Margaret Butler is an 78 y.o. female who [facility] as a Code Stroke.  Past medical history include hypertension, hyperlipidemia, placement of 2 coronary artery stents, no hx of stroke, TIA, or MI. Per patient and her husband she started experiencing intermittent transient expressive aphasia shortly after. Teleneurology was consulted. Was within TPA window while at OSH, but was not deemed a candidate given mild symptoms and NIHSS of 0.Upon arrival to the ED patient's aphasia had fully resolved. 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  Exam was fairly unremarkable except for right sided extinction and right upper extremity ataxia.   Assessment / Plan / Recommendation Clinical Impression  Pt presents with functional swallowing as assessed clinically.  Pt tolerated all consistencies trialed with no clinical s/s of aspiraiton.  Pt exhibited good oral clearance of all solid textures.  Recommend continuing regular texture diet with thin liquid. SLP Visit Diagnosis: Dysphagia, unspecified (R13.10)    Aspiration Risk  No limitations    Diet Recommendation Regular;Thin liquid   Liquid Administration via: Cup;Straw Medication Administration: Whole meds with liquid Supervision: Patient able to self feed    Follow up Recommendations None         Swallow Study   General HPI: Margaret Butler is an 78 y.o. female who [facility] Code St[facility].  Past medical history include hypertension, hyperlipidemia, placement of 2 coronary artery stents, no hx of stroke, TIA, or MI. Per patient and her husband she started experiencing intermittent transient expressive aphasia shortly after. Teleneurology was consulted. Was within TPA window while at OSH, but was not deemed a candidate given mild symptoms and NIHSS of 0.Upon arrival to the ED patient's aphasia had fully resolved. 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  Exam was fairly unremarkable except for right sided extinction and right upper extremity ataxia. Type of Study: Bedside Swallow Evaluation Diet Prior to this Study: Regular Temperature Spikes Noted: No Respiratory Status: Room air History of Recent Intubation: No Behavior/Cognition: Alert;Cooperative;Pleasant mood Oral Cavity Assessment: Within Functional Limits Oral Cavity - Dentition: Adequate natural dentition Vision: Functional for self-feeding Self-Feeding Abilities: Able to feed self Patient Positioning: Upright in chair Baseline Vocal Quality: Normal Volitional Cough: Strong Volitional Swallow: Able to elicit    Oral/Motor/Sensory Function Overall Oral Motor/Sensory Function: Within functional limits   Ice Chips Ice chips: Not tested   Thin Liquid Thin Liquid: Within functional limits Presentation: Straw    Nectar Thick Nectar Thick Liquid: Not tested   Honey Thick Honey Thick Liquid: Not tested   Puree Puree: Within functional limits Presentation: Spoon;Self Fed   Solid     Solid: Within functional limits Presentation: Self Fed      Sarh Kirschenbaum E  [place], [place], [place] Office: [phone]; Pager [phone]): [phone] 02/13/2019,12:00 PM

## 2019-02-13 NOTE — Progress Notes (Signed)
  Echocardiogram 2D Echocardiogram has been performed.  Oneal Deputy Chaselynn Kepple 02/13/2019, 9:53 AM

## 2019-02-13 NOTE — Evaluation (Signed)
Physical Therapy Evaluation Patient Details Name: Margaret Butler MRN: [id number] DOB: August 16, 1940 Today's Date: 02/13/2019   History of Present Illness  Margaret Butler is an 78 y.o. female who presents in transfer from [facility] as a Code Stroke.  Past medical history include hypertension, hyperlipidemia, placement of 2 coronary artery stents. Upon arrival to the ED patient's aphasia had fully resolved. 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  Clinical Impression  Pt mobilizing well and ready for DC home from PT standpoint. Pt does report a decr strength and power over the recent months as well as a decr in balance and confidence with mobility. Recommend HHPT and pt requests Nicholaus Corolla, PT with [facility] if possible.      Follow Up Recommendations Home health PT    Equipment Recommendations  None recommended by PT    Recommendations for Other Services       Precautions / Restrictions Precautions Precautions: Fall Restrictions Weight Bearing Restrictions: No      Mobility  Bed Mobility               General bed mobility comments: Pt up in chair  Transfers Overall transfer level: Modified independent Equipment used: None                Ambulation/Gait Ambulation/Gait assistance: Modified independent (Device/Increase time) Gait Distance (Feet): 350 Feet Assistive device: None Gait Pattern/deviations: Step-through pattern   Gait velocity interpretation: >2.62 ft/sec, indicative of community ambulatory General Gait Details: Steady gait   Stairs Stairs: Yes Stairs assistance: Modified independent (Device/Increase time) Stair Management: One rail Right;Alternating pattern;Step to pattern;Forwards Number of Stairs: 10 General stair comments: at times used step to  pattern to ascend stairs  Wheelchair Mobility    Modified Rankin (Stroke Patients Only)       Balance Overall balance assessment: Mild deficits observed, not formally tested                                           Pertinent Vitals/Pain Pain Assessment: Faces Faces Pain Scale: No hurt    Home Living Family/patient expects to be discharged to:: Private residence Living Arrangements: Spouse/significant other Available Help at Discharge: Family;Available 24 hours/day Type of Home: House Home Access: Stairs to enter   [employer] of Steps: 5 garage; 5 steps in front Home Layout: Able to live on main level with bedroom/bathroom Home Equipment: Shower seat - built in;Grab bars - tub/shower      Prior Function Level of Independence: Independent               Hand Dominance   Dominant Hand: Right    Extremity/Trunk Assessment   Upper Extremity Assessment Upper Extremity Assessment: Defer to OT evaluation    Lower Extremity Assessment Lower Extremity Assessment: Generalized weakness    Cervical / Trunk Assessment Cervical / Trunk Assessment: Normal  Communication   Communication: No difficulties  Cognition Arousal/Alertness: Awake/alert Behavior During Therapy: WFL for tasks assessed/performed Overall Cognitive Status: Within Functional Limits for tasks assessed  General Comments General comments (skin integrity, edema, etc.): Instructed pt in performing repeated sit to stands without use of hands and supine bridges for strength training    Exercises     Assessment/Plan    PT Assessment All further PT needs can be met in the next venue of care  PT Problem List Decreased strength       PT Treatment Interventions      PT Goals (Current goals can be found in the Care Plan section)  Acute Rehab PT Goals Patient Stated Goal: to go home soon PT Goal Formulation: All  assessment and education complete, DC therapy    Frequency     Barriers to discharge        Co-evaluation               AM-PAC PT "6 Clicks" Mobility  Outcome Measure Help needed turning from your back to your side while in a flat bed without using bedrails?: None Help needed moving from lying on your back to sitting on the side of a flat bed without using bedrails?: None Help needed moving to and from a bed to a chair (including a wheelchair)?: None Help needed standing up from a chair using your arms (e.g., wheelchair or bedside chair)?: None Help needed to walk in hospital room?: None Help needed climbing 3-5 steps with a railing? : None 6 Click Score: 24    End of Session   Activity Tolerance: Patient tolerated treatment well Patient left: in chair;with call bell/phone within reach   PT Visit Diagnosis: Muscle weakness (generalized) (M62.81)    Time: 4037-0964 PT Time Calculation (min) (ACUTE ONLY): 19 min   Charges:   PT Evaluation $PT Eval Low Complexity: 1 [place] Pager [phone] Office [place] 02/13/2019, 2:33 PM

## 2019-02-13 NOTE — Progress Notes (Signed)
VASCULAR LAB PRELIMINARY  PRELIMINARY  PRELIMINARY  PRELIMINARY  Carotid duplex completed.    Preliminary report:  See CV proc for preliminary results.   Nautica Hotz, RVT 02/13/2019, 10:17 AM

## 2019-02-13 NOTE — Evaluation (Signed)
Speech Language Pathology Evaluation Patient Details Name: Margaret Butler MRN: [id number] DOB: 03/05/1941 Today's Date: 02/13/2019 Time: 4235-3614 SLP Time Calculation (min) (ACUTE ONLY): 14 min  Problem List:  Patient Active Problem List   Diagnosis Date Noted  . Expressive aphasia 02/12/2019  . Hypertension   . Hypertensive urgency   . Elevated transaminase level   . Hyperlipidemia    Past Medical History:  Past Medical History:  Diagnosis Date  . Hyperlipidemia   . Hypertension    Past Surgical History: History reviewed. No pertinent surgical history. HPI:  Margaret Butler is an 78 y.o. female who presents in transfer from [facility] as a Code Stroke.  Past medical history include hypertension, hyperlipidemia, placement of 2 coronary artery stents, no hx of stroke, TIA, or MI. Per patient and her husband she started experiencing intermittent transient expressive aphasia shortly after. Teleneurology was consulted. Was within TPA window while at OSH, but was not deemed a candidate given mild symptoms and NIHSS of 0.Upon arrival to the ED patient's aphasia had fully resolved. 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  Exam was fairly unremarkable except for right sided extinction and right upper extremity ataxia.   Assessment / Plan / Recommendation Clinical Impression  Pt presents with cognitive linguistic ability within the normal range as assessed by COGNISTAT (see below for additional information).  Pt states that her speech and language abilities have returned to baseline.  She denies changes to cognition and memory.  Pt's speech is clear and free from dysarthria.  Pt has no further ST needs at this time.   COGNISTAT: All subtests are within the average range, except where otherwise  specified Orientation: 12/12 Attention: 8/8 Comprehension: 6/6 Repetition: 12/12 Naming: 8/8 Construction: Not assessed Memory: 12/12 Calculation: 4/4 Similarities: 7/8 Judgment: 6/6    SLP Assessment  SLP Recommendation/Assessment: Patient does not need any further Speech Lanaguage Pathology Services SLP Visit Diagnosis: Aphasia (R47.01)    Follow Up Recommendations  None    Frequency and Duration   N/A        SLP Evaluation Cognition  Overall Cognitive Status: Within Functional Limits for tasks assessed Arousal/Alertness: Awake/alert Orientation Level: Oriented X4 Attention: Sustained Sustained Attention: Appears intact Memory: Appears intact Awareness: Appears intact Problem Solving: Appears intact Safety/Judgment: Appears intact       Comprehension  Auditory Comprehension Overall Auditory Comprehension: Appears within functional limits for tasks assessed Commands: Within Functional Limits Conversation: Complex    Expression Expression Primary Mode of Expression: Verbal Verbal Expression Overall Verbal Expression: Appears within functional limits for tasks assessed Initiation: No impairment Repetition: No impairment Naming: No impairment Pragmatics: No impairment Written Expression Dominant Hand: Right   Oral / Motor  Oral Motor/Sensory Function Overall Oral Motor/Sensory Function: Within functional limits Motor Speech Overall Motor Speech: Appears within functional limits for tasks assessed Respiration: Within functional limits Phonation: Normal Resonance: Within functional limits Articulation: Within functional limitis Intelligibility: Intelligible Motor Planning: Witnin functional limits Motor Speech Errors: Not applicable   [place], [place], [place] Office: [phone]; Pager [phone] 02/13/2019, 11:52 AM

## 2019-02-13 NOTE — Consult Note (Signed)
Hospital Consult    Reason for Consult:  Symptomatic carotid stenosis Referring Physician:  Dr. Jerolyn CenterMathews MRN #:  [id number]  History of Present Illness: This is a 78 y.o. female admitted here in transfer after patient was noted to have expressive aphasia.  Has now fully resolved.  States that she has had this happen one time before which was multiple years ago where she could just not find her speech for a short time.  This time was noted after [employer] she attempted to take a nap but did not improve.  Subsequently presented to the freestanding emergency department.  She has a history of hypertension, hyperlipidemia and coronary artery disease with history of stenting in the past.  Father had significant coronary artery disease does take aspirin and statin as outpatient.  Vascular surgery consulted for bilateral 50 to 60% stenosis of the ICA origins with concern for left hemispheric tia.  Past Medical History:  Diagnosis Date  . Hyperlipidemia   . Hypertension     History reviewed. No pertinent surgical history.  Allergies  Allergen Reactions  . Atorvastatin Other (See Comments)    Muscle pain  . Lisinopril Cough       . Sertraline Other (See Comments)    Caused headaches   . Simvastatin Other (See Comments)    Muscle pain    Prior to Admission medications   Medication Sig Start Date End Date Taking? Authorizing Provider  aspirin EC 325 MG tablet Take 325 mg by mouth as needed (for sudden onset of stroke-like symptoms).   Yes [provider]  aspirin EC 81 MG tablet Take 81 mg by mouth as needed (for headaches).   Yes [provider]  Calcium Carbonate-Vitamin D (CALCIUM 600+D) 600-400 MG-UNIT tablet Take 1 tablet by mouth daily with breakfast.    Yes [provider]  clopidogrel (PLAVIX) 75 MG tablet Take 75 mg by mouth daily at 8 pm.  01/19/19  Yes [provider]  Dextromethorphan-guaiFENesin (CORICIDIN HBP CONGESTION/COUGH) 10-200 MG  CAPS Take 1 capsule by mouth every 8 (eight) hours as needed (for congestion).   Yes [provider]  escitalopram (LEXAPRO) 10 MG tablet Take 10 mg by mouth daily after breakfast.  11/17/18  Yes [provider]  Magnesium 250 MG TABS Take 500 mg by mouth daily after supper.   Yes [provider]  metoprolol succinate (TOPROL-XL) 25 MG 24 hr tablet Take 50 mg by mouth daily after supper.  12/29/18  Yes [provider]  Omega-3 Fatty Acids (FISH OIL) 1000 MG CAPS Take 1,000 mg by mouth daily with breakfast.   Yes [provider]  Potassium Gluconate 595 MG TBCR Take 595 mg by mouth daily with breakfast.   Yes [provider]  simvastatin (ZOCOR) 20 MG tablet Take 20 mg by mouth at bedtime.  01/25/19  Yes [provider]  telmisartan-hydrochlorothiazide (MICARDIS HCT) 80-25 MG tablet Take 1 tablet by mouth every morning.  11/13/18  Yes [provider]  TURMERIC PO Take 1,500-3,000 mg by mouth daily with breakfast.   Yes [provider]  aspirin 325 MG tablet Take 1 tablet (325 mg total) by mouth daily. 02/14/19   Alwyn RenMathews, Elizabeth G, MD    Social History   Socioeconomic History  . Marital status: Married    Spouse name: Not on file  . Number of children: Not on file  . Years of education: Not on file  . Highest education level: Not on file  Occupational  History  . Not on file  Social Needs  . Financial resource strain: Not on file  . Food insecurity    Worry: Not on file    Inability: Not on file  . Transportation needs    Medical: Not on file    Non-medical: Not on file  Tobacco Use  . Smoking status: Never Smoker  . Smokeless tobacco: Never Used  Substance and Sexual Activity  . Alcohol use: Never    Frequency: Never  . Drug use: Never  . Sexual activity: Not on file  Lifestyle  . Physical activity    Days per week: Not on file    Minutes per session: Not on file  . Stress: Not on file  Relationships   . Social Musicianconnections    Talks on phone: Not on file    Gets together: Not on file    Attends religious service: Not on file    Active member of club or organization: Not on file    Attends meetings of clubs or organizations: Not on file    Relationship status: Not on file  . Intimate partner violence    Fear of current or ex partner: Not on file    Emotionally abused: Not on file    Physically abused: Not on file    Forced sexual activity: Not on file  Other Topics Concern  . Not on file  Social History Narrative  . Not on file     History reviewed. No pertinent family history.  ROS: Cardiovascular: []  chest pain/pressure []  palpitations []  SOB lying flat []  DOE []  pain in legs while walking [x]  pain in legs at rest [x]  pain in legs at night []  non-healing ulcers []  hx of DVT []  swelling in legs  Pulmonary: []  productive cough []  asthma/wheezing []  home O2  Neurologic: []  weakness in []  arms []  legs []  numbness in []  arms []  legs []  hx of CVA []  mini stroke [x] difficulty speaking or slurred speech []  temporary loss of vision in one eye []  dizziness  Hematologic: []  hx of cancer []  bleeding problems []  problems with blood clotting easily  Endocrine:   []  diabetes []  thyroid disease  GI []  vomiting blood []  blood in stool  GU: []  CKD/renal failure []  HD--[]  M/W/F or []  T/T/S []  burning with urination []  blood in urine  Psychiatric: []  anxiety []  depression  Musculoskeletal: []  arthritis [x]  joint pain  Integumentary: []  rashes []  ulcers  Constitutional: []  fever []  chills   Physical Examination  Vitals:   02/13/19 0249 02/13/19 0500  BP: (!) 161/57 (!) 147/55  Pulse: 70 69  Resp: 18 18  Temp:    SpO2:     Body mass index is 26.7 kg/m.  General:  nad HENT: WNL, normocephalic Pulmonary: normal non-labored breathing Cardiac: Palpable radial pulses bilaterally, no palpable popliteal or pedal pulses Abdomen:  soft, NT/ND, no  masses Extremities: Moving all extremities well.  Her toes appear healthy Musculoskeletal: no muscle wasting or atrophy  Neurologic: A&O X 3; she has 5 out of 5 strength in all extremities  CBC    Component Value Date/Time   WBC 10.5 02/12/2019 1623   RBC 4.75 02/12/2019 1623   HGB 13.0 02/12/2019 1623   HCT 40.2 02/12/2019 1623   PLT 248 02/12/2019 1623   MCV 84.6 02/12/2019 1623   MCH 27.4 02/12/2019 1623   MCHC 32.3 02/12/2019 1623   RDW 13.2 02/12/2019 1623   LYMPHSABS 2.5 02/12/2019 1623  MONOABS 0.8 02/12/2019 1623   EOSABS 0.2 02/12/2019 1623   BASOSABS 0.1 02/12/2019 1623    BMET    Component Value Date/Time   NA 139 02/13/2019 1045   K 4.8 02/13/2019 1045   CL 99 02/13/2019 1045   CO2 27 02/13/2019 1045   GLUCOSE 240 (H) 02/13/2019 1045   BUN 13 02/13/2019 1045   CREATININE 0.56 02/13/2019 1045   CALCIUM 9.5 02/13/2019 1045   GFRNONAA >60 02/13/2019 1045   GFRAA >60 02/13/2019 1045    COAGS: Lab Results  Component Value Date   INR 0.9 02/12/2019     Non-Invasive Vascular Imaging:   Carotid Duplex Summary: Right Carotid: Velocities in the right ICA are consistent with a 40-59%                stenosis.  Left Carotid: Velocities in the left ICA are consistent with a 60-79% stenosis.  Vertebrals:  Bilateral vertebral arteries demonstrate antegrade flow. Subclavians: Normal flow hemodynamics were seen in bilateral subclavian              arteries.  CTA Head/Neck IMPRESSION: 1. There are several age indeterminate Severe intracranial stenoses: - Basilar Artery proximal 3rd (near occlusion). - Right MCA M1. - Right MCA posterior M2. This was discussed by telephone with Dr. Kerney Elbe on 02/12/2019 at 1825 hours, and I felt like MRI DWI imaging would be most helpful at this point. 2. Otherwise negative for large vessel occlusion, but there are additional moderate to severe arterial stenoses: - Right ICA supraclinoid segment. - bilateral  vertebral artery V4 segments. 3. Bilateral carotid bifurcation atherosclerosis resulting in 50-60% stenosis of both ICA origins.  MRI Brain IMPRESSION: No acute finding by MRI. Moderate chronic appearing small vessel ischemic changes of the cerebral hemispheric white matter   ASSESSMENT/PLAN: This is a 78 y.o. female here with left hemispheric TIA,, all symptoms resolved.  She will be on aspirin and Plavix and statin.  There is 60 to 79% left ICA stenosis and I have recommended left carotid endarterectomy to reduce the risk of further TIA or stroke.  I discussed with her the risk and benefits being stroke probably approaching 5% chance, 2 to 5% risk of cranial nerve injury which will most likely be transient, risk of incisional issues including hematoma infection and of course risk of anesthesia.  Despite recommendation patient is most interested in continuing medical therapy.  She also wants to discuss with her friend who is a Hydrographic surveyor in [place] and I have encouraged this as well.  All of her questions were answered.  I have recommended surgical intervention within the next few days up to a time period of 2 weeks.  If she stays I will have further discussion with her tomorrow otherwise I will reach out to her at home to make sure her any questions were answered at the very least she has discussed with her own vascular surgeon.  Trace Wirick C. Donzetta Matters, MD [facility] Office: [phone] Pager: [phone]

## 2019-02-13 NOTE — Progress Notes (Addendum)
STROKE TEAM PROGRESS NOTE   HISTORY OF PRESENT ILLNESS (per Dr Margaret Butler) Margaret Butler is an 78 y.o. female who presents in transfer [facility] as a Code Stroke.  Past medical history include hypertension, hyperlipidemia, placement of 2 coronary artery stents, no hx of stroke, TIA, or MI. Patient's last known normal is 1330. Per patient and her husband she started experiencing intermittent transient expressive aphasia shortly after. Teleneurology was consulted. Was within TPA window while at OSH, but was not deemed a candidate given mild symptoms and NIHSS of 0. CT at [facility] was negative for acute infarct or hemorrhage and she was transferred over to [facility] for further eval/work-up for possible TIA/stroke.  Upon arrival to the ED patient's aphasia had fully resolved. 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  Exam was fairly unremarkable except for right sided extinction and right upper extremity ataxia.   LSN: [id number] tPA Given: no, out of time window   SUBJECTIVE (INTERVAL HISTORY)  I have personally reviewed history of presenting illness with the patient.  She presented with 2 to 3-hour episode of expressive aphasia.  She states she had somewhat similar episode 20-30 years ago. MRI scan of the brain is negative for acute stroke.  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  Echocardiogram and carotid Dopplers are pending  OBJECTIVE Vitals:   02/13/19 0100 02/13/19 0200 02/13/19 0249 02/13/19 0500  BP: (!) 151/52  (!) 161/57 (!) 147/55  Pulse: 72  70 69  Resp: 18 18 18 18   Temp:      TempSrc:      SpO2:      Weight:      Height:        CBC:  Recent Labs  Lab 02/12/19 1623  WBC 10.5  NEUTROABS 6.8  HGB 13.0  HCT 40.2  MCV  84.6  PLT 248    Basic Metabolic Panel:  Recent Labs  Lab 02/12/19 1623  NA 138  K 3.4*  CL 100  CO2 26  GLUCOSE 176*  BUN 16  CREATININE 0.41*  CALCIUM 9.1    Lipid Panel: No results found for: CHOL, TRIG, HDL, CHOLHDL, VLDL, LDLCALC HgbA1c: No results found for: HGBA1C Urine Drug Screen:     Component Value Date/Time   LABOPIA NONE DETECTED 02/12/2019 1635   COCAINSCRNUR NONE DETECTED 02/12/2019 1635   LABBENZ NONE DETECTED 02/12/2019 1635   AMPHETMU NONE DETECTED 02/12/2019 1635   THCU NONE DETECTED 02/12/2019 1635   LABBARB NONE DETECTED 02/12/2019 1635    Alcohol Level     Component Value Date/Time   ETH <10 02/12/2019 1623    IMAGING  Ct Angio Head W Or Wo Contrast Ct Angio Neck W Or Wo Contrast 02/12/2019 IMPRESSION:  1. There are several age indeterminate Severe intracranial stenoses: - Basilar Artery proximal 3rd (near occlusion). - Right MCA M1. - Right MCA posterior M2.  2. Otherwise negative for large vessel occlusion, but there are additional moderate to severe arterial stenoses: - Right ICA supraclinoid segment. - bilateral vertebral artery V4 segments.  3. Bilateral carotid bifurcation atherosclerosis resulting in 50-60% stenosis of both ICA origins.    Ct Head Code Stroke Wo Contrast 02/12/2019 IMPRESSION:  1. No acute cortically based infarct or acute intracranial hemorrhage identified. ASPECTS 10.  2.  Moderately advanced chronic cerebral white matter disease, most commonly due to chronic small vessel ischemia.    MR Brain WO Contrast 02/13/2019 IMPRESSION: No acute finding by MRI. Moderate chronic appearing small vessel ischemic changes of the cerebral hemispheric white matter   Transthoracic Echocardiogram  00/00/2020 Pending    Bilateral Carotid Dopplers  00/00/2020 Pending   EKG - SR rate 76 BPM. (See cardiology reading for complete details)    PHYSICAL EXAM Blood pressure (!) 147/55, pulse 69, temperature 97.7 F (36.5 C),  temperature source Oral, resp. rate 18, height 5\' 3"  (1.6 m), weight 68.4 kg, SpO2 99 %. Pleasant elderly Caucasian lady not in distress. . Afebrile. Head is nontraumatic. Neck is supple without bruit.    Cardiac exam no murmur or gallop. Lungs are clear to auscultation. Distal pulses are well felt. Neurological Exam ;  Awake  Alert oriented x 3. Normal speech and language.eye movements full without nystagmus.fundi were not visualized. Vision acuity and fields appear normal. Hearing is normal. Palatal movements are normal. Face symmetric. Tongue midline. Normal strength, tone, reflexes and coordination. Normal sensation. Gait deferred.        ASSESSMENT/PLAN [name] is a 78 y.o. female with history of hypertension, hyperlipidemia, CAD (placement of 2 coronary artery stents) presenting with transient aphasia. She did not receive IV t-PA due to resolution of deficits.   Probable TIA:  No deficits   CT head -  no acute cortically based infarct or acute intracranial hemorrhage - moderately advanced chronic cerebral white matter disease,  MRI head - no acute finding  MRA head - not performed  CTA H&N - moderate to severe intracranial atherosclerosis as detailed above.  Carotid Doppler - pending  2D Echo - pending  Sars Corona Virus 2 - negative  LDL - pending  HgbA1c - pending  UDS  - negative  VTE prophylaxis - Lovenox  Diet  - Heart healthy with thin liquids.  clopidogrel 75 mg daily prior to admission, now on aspirin 325 mg daily and clopidogrel 75 mg daily (ASA 325 mg daily or p.r.n. prior to admission - chart inconsistant)  Patient counseled to be compliant with her antithrombotic medications  Ongoing aggressive stroke risk factor management  Therapy recommendations:  pending  Disposition:  Pending  Hypertension  Stable . Permissive hypertension (OK if < 220/120) but gradually normalize in 5-7 days . 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  She presented with 2 to 3-hour episode of expressive aphasia likely left hemispheric TIA doubt simple partial seizure.  Continue ongoing stroke work-up.  Recommend aspirin and  Plavix for 3 weeks followed by Plavix alone given history of cardiac stent.  Aggressive risk factor modification.  Discussed with Dr. Zigmund Daniel.  Greater than 50% time during this 35-minute visit was spent on counseling and coordination of care about her TIA and answering questions. ADDENDUM : Reviewed carotid ultrasound which shows 60 to 79% left ICA stenosis which seems to correspond with CT angiogram which shows heavily calcified plaque.  Actual stenosis may be greater.  Recommend vascular surgery consult to consider left carotid revascularization. Antony Contras, MD Medical Director [facility] Pager: [phone] 02/13/2019 11:19 AM   To contact Stroke Continuity provider, please refer to [email or web address] After hours, contact General Neurology

## 2019-02-13 NOTE — Progress Notes (Signed)
SLP Cancellation Note  Patient Details Name: Margaret Butler MRN: [id number] DOB: Sep 22, 1940   Cancelled treatment:      Attempted to see pt for swallowing evaluation.  Pt unavailable for echo at time of attempt.  SLP will return as schedule permits.     Burke Terry E Melainie Krinsky 02/13/2019, 11:02 AM

## 2019-02-13 NOTE — Evaluation (Signed)
Occupational Therapy Evaluation Patient Details Name: Margaret Butler MRN: [id number] DOB: 1941-04-21 Today's Date: 02/13/2019    History of Present Illness Margaret Butler is an 78 y.o. female who [facility] in [facility] as a Code Stroke.  Past medical history include hypertension, hyperlipidemia, placement of 2 coronary artery stents. Upon arrival to the ED patient's aphasia had fully resolved. 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   Clinical Impression   Pt PTA: living with spouse, appears to be very active. Pt currently performing ADL functional mobility with no AD and fair balance with challenges. Pt supervisionA to modified independence with ADL tasks sitting and standing at sink. Pt performing visual scanning etc with WNLs; proprioception, WNLs; sensation is WNLs. BUE AROM poor at shoulders due to arthritis and  Fine motor control WFLs. No focal deficits identified.Pt would greatly benefit from continued OT skilled services for R shoulder pain in HHOT setting. OT signing off.  Pt has family friend Margaret Butler, PT who she wants to work with- google search [facility] possibly.     Follow Up Recommendations  Home health OT;Supervision - Intermittent    Equipment Recommendations  None recommended by OT    Recommendations for Other Services       Precautions / Restrictions Precautions Precautions: Fall Restrictions Weight Bearing Restrictions: No      Mobility Bed Mobility Overal bed mobility: Modified Independent                Transfers Overall transfer level: Modified independent                    Balance Overall balance assessment: No apparent balance deficits (not formally assessed)                                          ADL either performed or assessed with clinical judgement   ADL Overall ADL's : At baseline                                             Vision Baseline Vision/History: No visual deficits Vision Assessment?: Yes Eye Alignment: Within Functional Limits Ocular Range of Motion: Within Functional Limits Alignment/Gaze Preference: Within Defined Limits Additional Comments: no visual deficits     Perception Perception Perception Tested?: Yes Comments: WNL   Praxis      Pertinent Vitals/Pain Pain Assessment: 0-10 Pain Score: 4  Pain Location: R shoulder Pain Descriptors / Indicators: Discomfort Pain Intervention(s): Monitored during session     Hand Dominance Right   Extremity/Trunk Assessment Upper Extremity Assessment Upper Extremity Assessment: RUE deficits/detail;LUE deficits/detail;Generalized weakness RUE Deficits / Details: <70* shoulder flex LUE Deficits / Details: <70* shoulder flex   Lower Extremity Assessment Lower Extremity Assessment: Defer to PT evaluation   Cervical / Trunk Assessment Cervical / Trunk Assessment: Normal   Communication Communication Communication: No difficulties   Cognition Arousal/Alertness: Awake/alert Behavior During Therapy: WFL for tasks assessed/performed Overall Cognitive Status: Within Functional Limits for tasks assessed  General Comments  VSS.     Exercises     Shoulder Instructions      Home Living Family/patient expects to be discharged to:: Private residence Living Arrangements: Spouse/significant other Available Help at Discharge: Family;Available 24 hours/day Type of Home: House Home Access: Stairs to enter [employer] of Steps: 5 garage; 5 steps in front   Home Layout: Able to live on main level with bedroom/bathroom     Bathroom Shower/Tub: Occupational psychologist: Handicapped height     Home Equipment: Shower  seat - built in;Grab bars - tub/shower          Prior Functioning/Environment Level of Independence: Independent                 OT Problem List: Decreased strength;Decreased safety awareness      OT Treatment/Interventions:      OT Goals(Current goals can be found in the care plan section) Acute Rehab OT Goals Patient Stated Goal: to go home soon OT Goal Formulation: With patient  OT Frequency:     Barriers to D/C:            Co-evaluation              AM-PAC OT "6 Clicks" Daily Activity     Outcome Measure Help from another person eating meals?: None Help from another person taking care of personal grooming?: None Help from another person toileting, which includes using toliet, bedpan, or urinal?: None Help from another person bathing (including washing, rinsing, drying)?: None Help from another person to put on and taking off regular upper body clothing?: None Help from another person to put on and taking off regular lower body clothing?: None 6 Click Score: 24   End of Session Equipment Utilized During Treatment: Gait belt Nurse Communication: Mobility status  Activity Tolerance: Patient tolerated treatment well Patient left: in chair;with call bell/phone within reach  OT Visit Diagnosis: Muscle weakness (generalized) (M62.81);Pain Pain - Right/Left: Right Pain - part of body: Shoulder                Time: 1020-1048 OT Time Calculation (min): 28 min Charges:  OT General Charges $OT Visit: 1 Visit OT Evaluation $OT Eval Moderate Complexity: 1 Mod OT Treatments $Neuromuscular Re-education: 8-22 mins  Ebony Hail Harold Hedge) Marsa Aris OTR/L Acute Rehabilitation Services Pager: [phone] Office: [place] 02/13/2019, 10:50 AM

## 2019-02-14 LAB — GLUCOSE, CAPILLARY
Glucose-Capillary: 141 mg/dL — ABNORMAL HIGH (ref 70–99)
Glucose-Capillary: 145 mg/dL — ABNORMAL HIGH (ref 70–99)
Glucose-Capillary: 133 mg/dL — ABNORMAL HIGH (ref 70–99)
Glucose-Capillary: 190 mg/dL — ABNORMAL HIGH (ref 70–99)

## 2019-02-14 LAB — MRSA PCR SCREENING: MRSA by PCR: NEGATIVE

## 2019-02-14 MED ORDER — IRBESARTAN 300 MG PO TABS
300.0000 mg | ORAL_TABLET | Freq: Every day | ORAL | Status: DC
  Administered 2019-02-14 – 2019-02-16 (×2): 300 mg via ORAL
  Filled 2019-02-14 (×2): qty 1

## 2019-02-14 MED ORDER — HYDROCHLOROTHIAZIDE 25 MG PO TABS
25.0000 mg | ORAL_TABLET | Freq: Every day | ORAL | Status: DC
  Administered 2019-02-14 – 2019-02-16 (×2): 25 mg via ORAL
  Filled 2019-02-14 (×2): qty 1

## 2019-02-14 MED ORDER — METOPROLOL SUCCINATE ER 50 MG PO TB24
50.0000 mg | ORAL_TABLET | Freq: Every day | ORAL | Status: DC
  Administered 2019-02-14: 18:00:00 50 mg via ORAL
  Filled 2019-02-14: qty 2

## 2019-02-14 MED ORDER — LIVING WELL WITH DIABETES BOOK
Freq: Once | Status: AC
  Administered 2019-02-14: 18:00:00
  Filled 2019-02-14: qty 1

## 2019-02-14 MED ORDER — CEFAZOLIN SODIUM-DEXTROSE 2-4 GM/100ML-% IV SOLN
2.0000 g | INTRAVENOUS | Status: AC
  Administered 2019-02-15: 2 g via INTRAVENOUS
  Filled 2019-02-14 (×2): qty 100

## 2019-02-14 MED ORDER — TELMISARTAN-HCTZ 80-25 MG PO TABS: 1.0000 | ORAL_TABLET | Freq: Every morning | ORAL | Status: DC

## 2019-02-14 NOTE — Progress Notes (Signed)
STROKE TEAM PROGRESS NOTE   SUBJECTIVE (INTERVAL HISTORY) Patient has no neurological complaints.  She is scheduled for elective left carotid surgery by Dr. Donzetta Matters for tomorrow.  OBJECTIVE Vitals:   02/13/19 2332 02/14/19 0400 02/14/19 0918 02/14/19 1100  BP: (!) 160/94 (!) 186/64 (!) 159/59 (!) 165/69  Pulse: 79 77 69 80  Resp: 18 18 18 18   Temp: 98 F (36.7 C) 98 F (36.7 C) 98.2 F (36.8 C) 98.5 F (36.9 C)  TempSrc: Oral Oral Oral Oral  SpO2: 96% 95% 100% 97%  Weight:      Height:        CBC:  Recent Labs  Lab 02/12/19 1623  WBC 10.5  NEUTROABS 6.8  HGB 13.0  HCT 40.2  MCV 84.6  PLT 027    Basic Metabolic Panel:  Recent Labs  Lab 02/12/19 1623 02/13/19 1045  NA 138 139  K 3.4* 4.8  CL 100 99  CO2 26 27  GLUCOSE 176* 240*  BUN 16 13  CREATININE 0.41* 0.56  CALCIUM 9.1 9.5    Lipid Panel:     Component Value Date/Time   CHOL 226 (H) 02/13/2019 1045   TRIG 209 (H) 02/13/2019 1045   HDL 62 02/13/2019 1045   CHOLHDL 3.6 02/13/2019 1045   VLDL 42 (H) 02/13/2019 1045   LDLCALC 122 (H) 02/13/2019 1045   HgbA1c:  Lab Results  Component Value Date   HGBA1C 7.2 (H) 02/13/2019   Urine Drug Screen:     Component Value Date/Time   LABOPIA NONE DETECTED 02/12/2019 1635   COCAINSCRNUR NONE DETECTED 02/12/2019 1635   LABBENZ NONE DETECTED 02/12/2019 1635   AMPHETMU NONE DETECTED 02/12/2019 1635   THCU NONE DETECTED 02/12/2019 1635   LABBARB NONE DETECTED 02/12/2019 1635    Alcohol Level     Component Value Date/Time   ETH <10 02/12/2019 1623    IMAGING  Ct Head Code Stroke Wo Contrast 02/12/2019 IMPRESSION:  1. No acute cortically based infarct or acute intracranial hemorrhage identified. ASPECTS 10.  2. Moderately advanced chronic cerebral white matter disease, most commonly due to chronic small vessel ischemia.   Ct Angio Head W Or Wo Contrast Ct Angio Neck W Or Wo Contrast 02/12/2019 IMPRESSION:  1. There are several age indeterminate Severe  intracranial stenoses: - Basilar Artery proximal 3rd (near occlusion). - Right MCA M1. - Right MCA posterior M2.  2. Otherwise negative for large vessel occlusion, but there are additional moderate to severe arterial stenoses: - Right ICA supraclinoid segment. - bilateral vertebral artery V4 segments.  3. Bilateral carotid bifurcation atherosclerosis resulting in 50-60% stenosis of both ICA origins.   MR Brain WO Contrast 02/13/2019 IMPRESSION: No acute finding by MRI. Moderate chronic appearing small vessel ischemic changes of the cerebral hemispheric white matter  Transthoracic Echocardiogram w/ bubble  02/13/2019 1. The left ventricle has normal systolic function with an ejection fraction of 60-65%. The cavity size was normal. There is moderately increased left ventricular wall thickness. Left ventricular diastolic Doppler parameters are consistent with impaired relaxation. Elevated mean left atrial pressure.  2. The right ventricle has normal systolic function. The cavity was normal.  3. The mitral valve is grossly normal. There is moderate mitral annular calcification present.  4. Trivial pericardial effusion is present.  5. The aortic valve is tricuspid. Mild calcification of the aortic valve. Aortic valve regurgitation is trivial by color flow Doppler. No stenosis of the aortic valve.  6. Normal LV systolic function; moderate LVH; mild diastolic  dysfunction; sclerotic aortic valve with trace AI; negative saline microcavitation study.  Bilateral Carotid Dopplers  02/13/2019 Right Carotid: Velocities in the right ICA are consistent with a 40-59% stenosis. Left Carotid: Velocities in the left ICA are consistent with a 60-79% stenosis. Vertebrals:  Bilateral vertebral arteries demonstrate antegrade flow. Subclavians: Normal flow hemodynamics were seen in bilateral subclavian arteries.  EKG - SR rate 76 BPM. (See cardiology reading for complete details)   PHYSICAL EXAM   Blood pressure (!)  165/69, pulse 80, temperature 98.5 F (36.9 C), temperature source Oral, resp. rate 18, height 5\' 3"  (1.6 m), weight 68.4 kg, SpO2 97 %. Pleasant elderly Caucasian lady not in distress. . Afebrile. Head is nontraumatic. Neck is supple without bruit.    Cardiac exam no murmur or gallop. Lungs are clear to auscultation. Distal pulses are well felt. Neurological Exam ;  Awake  Alert oriented x 3. Normal speech and language.eye movements full without nystagmus.fundi were not visualized. Vision acuity and fields appear normal. Hearing is normal. Palatal movements are normal. Face symmetric. Tongue midline. Normal strength, tone, reflexes and coordination. Normal sensation. Gait deferred.   ASSESSMENT/PLAN [name] is a 78 y.o. female with history of hypertension, hyperlipidemia, CAD (placement of 2 coronary artery stents) presenting with transient aphasia. She did not receive IV t-PA due to resolution of deficits.  L brain TIA in setting of L ICA stenosis  CT head -  no acute cortically based infarct or acute intracranial hemorrhage - moderately advanced chronic cerebral white matter disease,  MRI head - no acute finding  MRA head - not performed  CTA H&N - moderate to severe intracranial atherosclerosis as detailed above.  Carotid Doppler - R ICA 40-59%, L ICA 60-79%, VA antegrade  2D Echo - EF 60-65%. No source of embolus   [employer]ars Corona Virus 2 - negative  LDL - 122  HgbA1c - 7.2  UDS  - negative  VTE prophylaxis - Lovenox  Diet  - Heart healthy with thin liquids.  clopidogrel 75 mg daily prior to admission, now on aspirin 325 mg daily and clopidogrel 75 mg daily (ASA 325 mg daily or p.r.n. prior to admission - chart inconsistant)  Patient counseled to be compliant with her antithrombotic medications  Ongoing aggressive stroke risk factor management  Therapy recommendations:  HH PT and OT  Disposition:  [facility]. Office will call with appointment date and time. Order placed.  Carotid Stenosis  CTA 50-60% stenosis of both ICA origins   Carotid Doppler - R ICA 40-59%, L ICA 6-79%, VA antegrade  Given symptomatic L ICA stenosis - needs L CEA - pt wants medical therapy, to discuss with friend [place]  Hypertensive Urgency, Hypertension  SBP  238 on arrival  Stable now . Permissive hypertension (OK if < 220/120) but gradually normalize in 5-7 days . 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  Continue present management.  Delia HeadyPramod Sethi, MD Medical Director [facility] Pager: [phone] 02/14/2019 2:52 PM   To contact Stroke Continuity provider, please refer to [email or web address]. After hours, contact General Neurology

## 2019-02-14 NOTE — Progress Notes (Signed)
  Progress Note    02/14/2019 8:07 AM * No surgery found *  Subjective: No overnight issues, now on surgery  Vitals:   02/13/19 2332 02/14/19 0400  BP: (!) 160/94 (!) 186/64  Pulse: 79 77  Resp: 18 18  Temp: 98 F (36.7 C) 98 F (36.7 C)  SpO2: 96% 95%    Physical Exam: Awake alert oriented On the respirations Cranial nerves intact Grip strength 5 out of 5 bilaterally  CBC    Component Value Date/Time   WBC 10.5 02/12/2019 1623   RBC 4.75 02/12/2019 1623   HGB 13.0 02/12/2019 1623   HCT 40.2 02/12/2019 1623   PLT 248 02/12/2019 1623   MCV 84.6 02/12/2019 1623   MCH 27.4 02/12/2019 1623   MCHC 32.3 02/12/2019 1623   RDW 13.2 02/12/2019 1623   LYMPHSABS 2.5 02/12/2019 1623   MONOABS 0.8 02/12/2019 1623   EOSABS 0.2 02/12/2019 1623   BASOSABS 0.1 02/12/2019 1623    BMET    Component Value Date/Time   NA 139 02/13/2019 1045   K 4.8 02/13/2019 1045   CL 99 02/13/2019 1045   CO2 27 02/13/2019 1045   GLUCOSE 240 (H) 02/13/2019 1045   BUN 13 02/13/2019 1045   CREATININE 0.56 02/13/2019 1045   CALCIUM 9.5 02/13/2019 1045   GFRNONAA >60 02/13/2019 1045   GFRAA >60 02/13/2019 1045    INR    Component Value Date/Time   INR 0.9 02/12/2019 1623     Intake/Output Summary (Last 24 hours) at 02/14/2019 0807 Last data filed at 02/14/2019 0100 Gross per 24 hour  Intake -  Output 1001 ml  Net -1001 ml     Assessment:  78 y.o. female is here with symptomatic left carotid stenosis.  Plan: We will plan for left carotid endarterectomy tomorrow in the operating room.  She will need to be n.p.o. past midnight.  I discussed the risk benefits alternatives including risk of stroke, cranial nerve injury, wound issues, anesthetic complications with both her and her husband and daughter via speaker phone.  They asked good questions which were all answered.   Trulee Hamstra C. Donzetta Matters, MD [facility] Office: [phone] Pager: [phone]   02/14/2019 8:07 AM

## 2019-02-14 NOTE — Progress Notes (Signed)
PROGRESS NOTE    Margaret Butler  ZOX:[id number] DOB: 1940-09-16 DOA: 02/12/2019 PCP: System, Pcp Not In    Brief Narrative: 78 y.o.femalewith medical history significant forhypertension and hyperlipidemia, now presenting to the emergency department for evaluation of speech difficulty. Patient reports that she went to bed in her usual state last night, felt a little more fatigued than usual this morning, but was able to go about her usual activities, had been with her husband, they had lunch together and everything seemed to be normal, but at approximately 1:30 PM, she developed difficulty with her speech. She seemed to have difficulty finding the right words to use. She laid down for a nap, continued to have this problem when she woke up, and came to the ED for evaluation. She denies any headache, change in vision or hearing, or focal numbness or weakness. The patient reports that she had similar symptoms in the 1980s that resolved very quickly. Patient's husband followed me into the hall after the exam to note that the patient has had some problems over months to years with using the wrong word occasionally or forgetting the name of a family member or friend briefly, but none of these events are very brief and the patient becomes upset when her husband expresses concern about it. She denies any recent fevers, chills, cough, shortness of breath, or sick contacts.  ED Course:Upon arrival to the ED, patient is found to be afebrile, saturating well on room air, and hypertensive to 238/82. EKG features sinus rhythm with LVH and repolarization abnormality. Noncontrast head CT was negative for acute intracranial abnormality. Chemistry panel is notable for glucose of 176 and slight elevation in transaminases. CBC is unremarkable. CTA head and neck reveal severe age indeterminant stenoses in several areas but otherwise negative for large vessel occlusion. Blood pressure was treated with  nicardipine initially. Neurology was consulted by the ED physician and recommends a medical admission for further evaluation and management  Assessment & Plan:   Principal Problem:   Expressive aphasia Active Problems:   Hypertension   Hypertensive urgency   Elevated transaminase level   Hyperlipidemia   1.Expressive aphasia/symptomatic left carotid stenosis -Presents with expressive aphasia, which had been resolved completely by the time she came to the hospital.  -Head CT with no acute findings, CTA head & neck with several sites of severe age-indeterminate stenoses -Neurology much appreciated- -MRI of the brain shows no acute stroke -Echocardiogram ef 60 to 65 % -Carotid ultrasound 60 to 79% on the left and 40 to 59 %on the right -Fasting lipid profile LDL 122 and hemoglobin A1c is 7.2 PT recommends HHPT We will continue aspirin and Plavix for now. -Patient at high risk for imminent stroke with Left carotid stenosis and needs surgery asap -Patient is willing to undergo surgery tomorrow appreciate vascular surgery.  2.Hypertension with hypertensive urgency-restart metoprolol and Micardis. -SBP was 238 initially, treated initially with nicardipine infusion  3.Hyperlipidemia -Continue statin and omega-3's  4 new onset type 2 dm patient does not want any therapy started until she speaks to her pcp hba1c 7.2   DVT prophylaxis:Lovenox Code Status:Full Family Communication: Attempted to call husband many times he did not pick up the phone dw son  Consults called:neurology and vascular surgery    Estimated body mass index is 26.7 kg/m as calculated from the following:   Height as of this encounter: 5\' 3"  (1.6 m).   Weight as of this encounter: 68.4 kg.    Subjective: Awake alert anxious to  have surgery no further problems with speech or localized weakness reported.  Overnight staff reports no acute issues.  Objective: Vitals:   02/13/19 2035  02/13/19 2332 02/14/19 0400 02/14/19 0918  BP: (!) 169/58 (!) 160/94 (!) 186/64 (!) 159/59  Pulse: 72 79 77 69  Resp:  Temp: 98.6 F (37 C) 98 F (36.7 C) 98 F (36.7 C) 98.2 F (36.8 C)  TempSrc: Oral Oral Oral Oral  SpO2: 95% 96% 95% 100%  Weight:      Height:        Intake/Output Summary (Last 24 hours) at 02/14/2019 1014 Last data filed at 02/14/2019 0929 Gross per 24 hour  Intake 360 ml  Output 1001 ml  Net -641 ml   Filed Weights   02/12/19 1657  Weight: 68.4 kg    Examination:  General exam: Appears calm and comfortable  Respiratory system: Clear to auscultation. Respiratory effort normal. Cardiovascular system: S1 & S2 heard, RRR. No JVD, murmurs, rubs, gallops or clicks. No pedal edema. Gastrointestinal system: Abdomen is nondistended, soft and nontender. No organomegaly or masses felt. Normal bowel sounds heard. Central nervous system: Alert and oriented. No focal neurological deficits. Extremities: Symmetric 5 x 5 power. Skin: No rashes, lesions or ulcers Psychiatry: Judgement and insight appear normal. Mood & affect appropriate.     Data Reviewed: I have personally reviewed following labs and imaging studies  CBC: Recent Labs  Lab 02/12/19 1623  WBC 10.5  NEUTROABS 6.8  HGB 13.0  HCT 40.2  MCV 84.6  PLT 248   Basic Metabolic Panel: Recent Labs  Lab 02/12/19 1623 02/13/19 1045  NA 138 139  K 3.4* 4.8  CL 100 99  CO2 26 27  GLUCOSE 176* 240*  BUN 16 13  CREATININE 0.41* 0.56  CALCIUM 9.1 9.5   GFR: Estimated Creatinine Clearance: 55.5 mL/min (by C-G formula based on SCr of 0.56 mg/dL). Liver Function Tests: Recent Labs  Lab 02/12/19 1623 02/13/19 1045  AST 42* 46*  ALT 68* 63*  ALKPHOS 55 44  BILITOT 0.4 0.9  PROT 6.8 6.7  ALBUMIN 3.8 3.6   No results for input(s): LIPASE, AMYLASE in the last 168 hours. No results for input(s): AMMONIA in the last 168 hours. Coagulation Profile: Recent Labs  Lab 02/12/19 1623  INR  0.9   Cardiac Enzymes: No results for input(s): CKTOTAL, CKMB, CKMBINDEX, TROPONINI in the last 168 hours. BNP (last 3 results) No results for input(s): PROBNP in the last 8760 hours. HbA1C: Recent Labs    02/13/19 1045  HGBA1C 7.2*   CBG: Recent Labs  Lab 02/13/19 0627 02/13/19 1233 02/13/19 1723 02/13/19 2041 02/14/19 0653  GLUCAP 112* 209* 171* 227* 141*   Lipid Profile: Recent Labs    02/13/19 1045  CHOL 226*  HDL 62  LDLCALC 122*  TRIG 209*  CHOLHDL 3.6   Thyroid Function Tests: No results for input(s): TSH, T4TOTAL, FREET4, T3FREE, THYROIDAB in the last 72 hours. Anemia Panel: No results for input(s): VITAMINB12, FOLATE, FERRITIN, TIBC, IRON, RETICCTPCT in the last 72 hours. Sepsis Labs: No results for input(s): PROCALCITON, LATICACIDVEN in the last 168 hours.  Recent Results (from the past 240 hour(s))  SARS Coronavirus 2 (Hosp order,Performed in [facility] lab via [employer] ID)     Status: None   Collection Time: 02/12/19  4:33 PM   Specimen: Dry Nasal Swab ([employer] Now)  Result Value Ref Range Status   SARS Coronavirus 2 ([employer] Now) NEGATIVE  NEGATIVE Final    Comment: (NOTE) SARS-CoV-2 target nucleic acids are NOT DETECTED. The SARS-CoV-2 RNA is generally detectable in upper and lower respiratory specimens during the acute phase of infection.  Negativeresults do not preclude SARS-CoV-2 infection, do not rule out coinfections with other pathogens, and should not be used as the  sole basis for treatment or other patient management decisions.  Negative results must be combined with clinical observations, patient history, and epidemiological information. The expected result is Negative. Fact Sheet for Patients: http://www.gra[place].org/https://www.fda.gov/media/136524/download Fact Sheet for Healthcare Providers: En[employer]roConcern.sihttps://www.fda.gov/media/136523/download This test is not yet approved or cleared by the [place] FDA and  has been authorized for detection and/or  diagnosis of SARS-CoV-2 by FDA under an Emergency Use Authorization (EUA).  This EUA will remain in effect (meaning this test can be used) for the duration of  the COVID19 declaration under Section 5 64(b)(1) of the Act, 21 U.S.C.  section [id number] 3(b)(1), unless the authorization is terminated or revoked sooner. Performed at [facility] [place], [address] [place], [place] [id number]          Radiology Studies: Ct Angio Head W Or Wo Contrast  Result Date: 02/12/2019 CLINICAL DATA:  78 year old female with abnormal speech, expressive aphasia. EXAM: CT ANGIOGRAPHY HEAD AND NECK TECHNIQUE: Multidetector CT imaging of the head and neck was performed using the standard protocol during bolus administration of intravenous contrast. Multiplanar CT image reconstructions and MIPs were obtained to evaluate the vascular anatomy. Carotid stenosis measurements (when applicable) are obtained utilizing NASCET criteria, using the distal internal carotid diameter as the denominator. CONTRAST:  100mL OMNIPAQUE IOHEXOL 350 MG/ML SOLN COMPARISON:  Head CT without contrast 1618 hours today. FINDINGS: CTA NECK Skeleton: No acute osseous abnormality identified. Upper chest: No superior mediastinal lymphadenopathy. Normal enhancement of the visible central pulmonary arteries. Probable peribronchial atelectasis in the visible lungs. Other neck: Negative. Aortic arch: Calcified aortic atherosclerosis. Three vessel arch configuration. Right carotid system: Minimal calcified plaque at the right CCA origin without stenosis. Complex soft and calcified plaque at the right carotid bifurcation and affecting the right ICA origin with 50-60 % stenosis with respect to the distal vessel. Distal to the bulb the vessel is mildly tortuous but otherwise normal to the skull base. Left carotid system: Normal left CCA origin. Bulky calcified and lesser soft plaque at the left carotid bifurcation continuing into the left ICA origin and  bulb resulting in stenosis up to 60 % with respect to the distal vessel (series 9, image 149). Distal to the bulb the vessel is normal to the skull base. Vertebral arteries: Calcified plaque in the proximal right subclavian artery without significant stenosis and spares the right vertebral artery origin. Patent right vertebral artery to the skull base with only mild V3 calcified plaque. No proximal left subclavian artery stenosis despite plaque. Normal left vertebral artery origin. Tortuous left V1 segment. The left vertebral is mildly dominant and patent to the skull base. There is moderate calcified V3 segment plaque, but no significant stenosis. CTA HEAD Posterior circulation: There is severe bilateral V4 segment vertebral artery atherosclerosis and stenosis. The distal left V4 segment appears near occluded proximal to the vertebrobasilar junction (series 8, image 115). There is moderate to severe stenosis of the mid right V4 segment proximal to the right PICA. Both PICA remain patent. There is near occlusion of the proximal 3rd of the basilar artery on series 7, image 126 with reconstitution of the vessel distally. There is additional mild to moderate  stenosis just proximal to the basilar tip (series 9, image 106. Despite this the bilateral SCA and PCA origins are patent. There is a fetal type left PCA origin. The right PCOM is diminutive or absent. Left PCA branches are patent with mild to moderate P2 segment irregularity and stenosis. Right PCA branches are patent but more diminutive with mild generalized irregularity. Anterior circulation: Both ICA siphons are patent and heavily calcified. On the left there is mild to moderate supraclinoid stenosis. On the right there is moderate to severe supraclinoid stenosis (series 7, image 119). The left posterior communicating arteryorigin is normal. The carotid termini are patent. The MCA and ACA origins are patent. The left M1 appears dominant. The anterior  communicating artery and bilateral ACA branches are patent. There is mild distal left A2 stenosis on series 12, image 23. The left MCA M1 segment and bifurcation are patent without stenosis. There is mild to moderate left MCA M2 branch irregularity with mild stenosis on series 10, image 24. No left MCA branch occlusion identified. There is severe stenosis of the right MCA mid M1 on series 11, image 20. The right MCA bifurcation than is patent, but there is a severe stenosis of the dominant posterior right MCA M2 branch on series 12, image 15. No right MCA branch occlusion is identified. Venous sinuses: Early contrast timing but the major dural venous sinuses appear grossly patent. Anatomic variants: Fetal type left PCA origin. Review of the MIP images confirms the above findings IMPRESSION: 1. There are several age indeterminate Severe intracranial stenoses: - Basilar Artery proximal 3rd (near occlusion). - Right MCA M1. - Right MCA posterior M2. This was discussed by telephone with Dr. Caryl PinaERIC LINDZEN on 02/12/2019 at 1825 hours, and I felt like MRI DWI imaging would be most helpful at this point. 2. Otherwise negative for large vessel occlusion, but there are additional moderate to severe arterial stenoses: - Right ICA supraclinoid segment. - bilateral vertebral artery V4 segments. 3. Bilateral carotid bifurcation atherosclerosis resulting in 50-60% stenosis of both ICA origins. Electronically Signed   By: Odessa FlemingH  Hall M.D.   On: 02/12/2019 18:42   Ct Angio Neck W Or Wo Contrast  Result Date: 02/12/2019 CLINICAL DATA:  78 year old female with abnormal speech, expressive aphasia. EXAM: CT ANGIOGRAPHY HEAD AND NECK TECHNIQUE: Multidetector CT imaging of the head and neck was performed using the standard protocol during bolus administration of intravenous contrast. Multiplanar CT image reconstructions and MIPs were obtained to evaluate the vascular anatomy. Carotid stenosis measurements (when applicable) are obtained  utilizing NASCET criteria, using the distal internal carotid diameter as the denominator. CONTRAST:  100mL OMNIPAQUE IOHEXOL 350 MG/ML SOLN COMPARISON:  Head CT without contrast 1618 hours today. FINDINGS: CTA NECK Skeleton: No acute osseous abnormality identified. Upper chest: No superior mediastinal lymphadenopathy. Normal enhancement of the visible central pulmonary arteries. Probable peribronchial atelectasis in the visible lungs. Other neck: Negative. Aortic arch: Calcified aortic atherosclerosis. Three vessel arch configuration. Right carotid system: Minimal calcified plaque at the right CCA origin without stenosis. Complex soft and calcified plaque at the right carotid bifurcation and affecting the right ICA origin with 50-60 % stenosis with respect to the distal vessel. Distal to the bulb the vessel is mildly tortuous but otherwise normal to the skull base. Left carotid system: Normal left CCA origin. Bulky calcified and lesser soft plaque at the left carotid bifurcation continuing into the left ICA origin and bulb resulting in stenosis up to 60 % with respect to the distal vessel (  series 9, image 149). Distal to the bulb the vessel is normal to the skull base. Vertebral arteries: Calcified plaque in the proximal right subclavian artery without significant stenosis and spares the right vertebral artery origin. Patent right vertebral artery to the skull base with only mild V3 calcified plaque. No proximal left subclavian artery stenosis despite plaque. Normal left vertebral artery origin. Tortuous left V1 segment. The left vertebral is mildly dominant and patent to the skull base. There is moderate calcified V3 segment plaque, but no significant stenosis. CTA HEAD Posterior circulation: There is severe bilateral V4 segment vertebral artery atherosclerosis and stenosis. The distal left V4 segment appears near occluded proximal to the vertebrobasilar junction (series 8, image 115). There is moderate to severe  stenosis of the mid right V4 segment proximal to the right PICA. Both PICA remain patent. There is near occlusion of the proximal 3rd of the basilar artery on series 7, image 126 with reconstitution of the vessel distally. There is additional mild to moderate stenosis just proximal to the basilar tip (series 9, image 106. Despite this the bilateral SCA and PCA origins are patent. There is a fetal type left PCA origin. The right PCOM is diminutive or absent. Left PCA branches are patent with mild to moderate P2 segment irregularity and stenosis. Right PCA branches are patent but more diminutive with mild generalized irregularity. Anterior circulation: Both ICA siphons are patent and heavily calcified. On the left there is mild to moderate supraclinoid stenosis. On the right there is moderate to severe supraclinoid stenosis (series 7, image 119). The left posterior communicating arteryorigin is normal. The carotid termini are patent. The MCA and ACA origins are patent. The left M1 appears dominant. The anterior communicating artery and bilateral ACA branches are patent. There is mild distal left A2 stenosis on series 12, image 23. The left MCA M1 segment and bifurcation are patent without stenosis. There is mild to moderate left MCA M2 branch irregularity with mild stenosis on series 10, image 24. No left MCA branch occlusion identified. There is severe stenosis of the right MCA mid M1 on series 11, image 20. The right MCA bifurcation than is patent, but there is a severe stenosis of the dominant posterior right MCA M2 branch on series 12, image 15. No right MCA branch occlusion is identified. Venous sinuses: Early contrast timing but the major dural venous sinuses appear grossly patent. Anatomic variants: Fetal type left PCA origin. Review of the MIP images confirms the above findings IMPRESSION: 1. There are several age indeterminate Severe intracranial stenoses: - Basilar Artery proximal 3rd (near occlusion). -  Right MCA M1. - Right MCA posterior M2. This was discussed by telephone with Dr. Caryl PinaERIC LINDZEN on 02/12/2019 at 1825 hours, and I felt like MRI DWI imaging would be most helpful at this point. 2. Otherwise negative for large vessel occlusion, but there are additional moderate to severe arterial stenoses: - Right ICA supraclinoid segment. - bilateral vertebral artery V4 segments. 3. Bilateral carotid bifurcation atherosclerosis resulting in 50-60% stenosis of both ICA origins. Electronically Signed   By: Odessa FlemingH  Hall M.D.   On: 02/12/2019 18:42   Mr Brain Wo Contrast (neuro Protocol)  Result Date: 02/13/2019 CLINICAL DATA:  Acute presentation yesterday with intermittent slurred speech. EXAM: MRI HEAD WITHOUT CONTRAST TECHNIQUE: Multiplanar, multiecho pulse sequences of the brain and surrounding structures were obtained without intravenous contrast. COMPARISON:  CT studies 02/12/2019 FINDINGS: Brain: Diffusion imaging does not show any acute or subacute infarction. Brainstem and cerebellum  are normal. Cerebral hemispheres show moderate changes of chronic small vessel disease affecting the deep and subcortical white matter. No large vessel territory infarction. No mass lesion, hemorrhage, hydrocephalus or extra-axial collection. Few small foci of hemosiderin deposition associated with some of the old small vessel infarctions. Vascular: Major vessels at the base of the brain show flow. Skull and upper cervical spine: Negative Sinuses/Orbits: Clear/normal Other: None IMPRESSION: No acute finding by MRI. Moderate chronic appearing small vessel ischemic changes of the cerebral hemispheric white matter Electronically Signed   By: Paulina Fusi M.D.   On: 02/13/2019 08:32   Ct Head Code Stroke Wo Contrast  Result Date: 02/12/2019 CLINICAL DATA:  Code stroke. 78 year old female with intermittent slurred speech since 1300 hours. EXAM: CT HEAD WITHOUT CONTRAST TECHNIQUE: Contiguous axial images were obtained from the base of the  skull through the vertex without intravenous contrast. COMPARISON:  None. FINDINGS: Brain: Patchy and confluent bilateral cerebral white matter hypodensity. No acute intracranial hemorrhage identified. No midline shift, mass effect, or evidence of intracranial mass lesion. No ventriculomegaly. Mild heterogeneity of the right basal ganglia. No definite cortical encephalomalacia. No cortically based acute infarct identified. Vascular: Extensive Calcified atherosclerosis at the skull base. No suspicious intracranial vascular hyperdensity. Skull: No acute osseous abnormality identified. Sinuses/Orbits: Paranasal Visualized paranasal sinuses and mastoids well pneumatized. Other: No acute orbit or scalp soft tissue findings. ASPECTS [facility] Stroke Program Early CT Score) - Ganglionic level infarction (caudate, lentiform nuclei, internal capsule, insula, M1-M3 cortex): 7 - Supraganglionic infarction (M4-M6 cortex): 3 Total score (0-10 with 10 being normal): 10 IMPRESSION: 1. No acute cortically based infarct or acute intracranial hemorrhage identified. ASPECTS 10. 2. Moderately advanced chronic cerebral white matter disease, most commonly due to chronic small vessel ischemia. Study discussed by telephone with Dr. Fleet Contras LITTLE on 02/12/2019 at 16:33 . Electronically Signed   By: Odessa Fleming M.D.   On: 02/12/2019 16:33   Vas US Carotid  Result Date: 02/13/2019 Carotid Arterial Duplex Study Indications:  Speech disturbance, Weakness and 50-60% ICA stenosis by CTA. Risk Factors: Hypertension, hyperlipidemia. Performing Technologist: Sherren Kerns RVS  Examination Guidelines: A complete evaluation includes B-mode imaging, spectral Doppler, color Doppler, and power Doppler as needed of all accessible portions of each vessel. Bilateral testing is considered an integral part of a complete examination. Limited examinations for reoccurring indications may be performed as noted.  Right Carotid Findings:  +----------+--------+--------+--------+--------+------------------+             PSV cm/s EDV cm/s Stenosis Describe Comments            +----------+--------+--------+--------+--------+------------------+  CCA Prox   103      16                         intimal thickening  +----------+--------+--------+--------+--------+------------------+  CCA Distal 76       16                         intimal thickening  +----------+--------+--------+--------+--------+------------------+  ICA Prox   203      47                calcific Shadowing           +----------+--------+--------+--------+--------+------------------+  ICA Mid    152      37                                             +----------+--------+--------+--------+--------+------------------+  ICA Distal 133      26                                             +----------+--------+--------+--------+--------+------------------+  ECA        203      9                                              +----------+--------+--------+--------+--------+------------------+ +----------+--------+-------+--------+-------------------+             PSV cm/s EDV cms Describe Arm Pressure (mmHG)  +----------+--------+-------+--------+-------------------+  Subclavian 59                                             +----------+--------+-------+--------+-------------------+ +---------+--------+--+--------+--+  Vertebral PSV cm/s 50 EDV cm/s 15  +---------+--------+--+--------+--+  Left Carotid Findings: +----------+--------+--------+--------+--------+------------------+             PSV cm/s EDV cm/s Stenosis Describe Comments            +----------+--------+--------+--------+--------+------------------+  CCA Prox   57       13                         intimal thickening  +----------+--------+--------+--------+--------+------------------+  CCA Distal 56       12                         intimal thickening  +----------+--------+--------+--------+--------+------------------+  ICA Prox   273      72                 calcific Shadowing           +----------+--------+--------+--------+--------+------------------+  ICA Mid    212      33                                             +----------+--------+--------+--------+--------+------------------+  ICA Distal 128      33                                             +----------+--------+--------+--------+--------+------------------+  ECA        24       6                                              +----------+--------+--------+--------+--------+------------------+ +----------+--------+--------+--------+-------------------+  Subclavian PSV cm/s EDV cm/s Describe Arm Pressure (mmHG)  +----------+--------+--------+--------+-------------------+             89                                             +----------+--------+--------+--------+-------------------+ +---------+--------+--+--------+-+  Vertebral PSV cm/s 60 EDV cm/s 9  +---------+--------+--+--------+-+  Summary: Right Carotid: Velocities in the right ICA are consistent with a 40-59%                stenosis. Left Carotid: Velocities in the left ICA are consistent with a 60-79% stenosis. Vertebrals:  Bilateral vertebral arteries demonstrate antegrade flow. Subclavians: Normal flow hemodynamics were seen in bilateral subclavian              arteries. *See table(s) above for measurements and observations.     Preliminary         Scheduled Meds:  aspirin  300 mg Rectal Daily   Or   aspirin  325 mg Oral Daily   clopidogrel  75 mg Oral Q2000   enoxaparin (LOVENOX) injection  40 mg Subcutaneous Daily   escitalopram  10 mg Oral QPC breakfast   insulin aspart  0-5 Units Subcutaneous QHS   insulin aspart  0-9 Units Subcutaneous TID WC   omega-3 acid ethyl esters  1 g Oral BID   simvastatin  20 mg Oral q1800   Continuous Infusions:  sodium chloride 500 mL (02/12/19 1725)   [START ON 02/15/2019]  ceFAZolin (ANCEF) IV       LOS: 1 day       Alwyn Ren, MD [facility]  If 7PM-7AM, please contact night-coverage [email or web address] Password [email or web address] 02/14/2019, 10:14 AM

## 2019-02-14 NOTE — Plan of Care (Signed)
  Problem: Education: Goal: Knowledge of General Education information will improve Description: Including pain rating scale, medication(s)/side effects and non-pharmacologic comfort measures Outcome: Progressing   Problem: Health Behavior/Discharge Planning: Goal: Ability to manage health-related needs will improve Outcome: Progressing   Problem: Clinical Measurements: Goal: Will remain free from infection Outcome: Progressing Goal: Diagnostic test results will improve Outcome: Progressing Goal: Respiratory complications will improve Outcome: Progressing Goal: Cardiovascular complication will be avoided Outcome: Progressing   Problem: Nutrition: Goal: Adequate nutrition will be maintained Outcome: Progressing   Problem: Coping: Goal: Level of anxiety will decrease Outcome: Progressing   Problem: Elimination: Goal: Will not experience complications related to bowel motility Outcome: Progressing Goal: Will not experience complications related to urinary retention Outcome: Progressing   Problem: Pain Managment: Goal: General experience of comfort will improve Outcome: Progressing   Problem: Safety: Goal: Ability to remain free from injury will improve Outcome: Progressing   Problem: Skin Integrity: Goal: Risk for impaired skin integrity will decrease Outcome: Progressing

## 2019-02-14 NOTE — Progress Notes (Addendum)
Inpatient Diabetes Program Recommendations  AACE/ADA: New Consensus Statement on Inpatient Glycemic Control (2015)  Target Ranges:  Prepandial:   less than 140 mg/dL      Peak postprandial:   less than 180 mg/dL (1-2 hours)      Critically ill patients:  140 - 180 mg/dL   Results for Margaret Butler, Margaret Butler (MRN [id number]) as of 02/14/2019 10:54  Ref. Range 02/13/2019 06:27 02/13/2019 12:33 02/13/2019 17:23 02/13/2019 20:41  Glucose-Capillary Latest Ref Range: 70 - 99 mg/dL 112 (H) 209 (H)  3 units NOVOLOG  171 (H)  2 units NOVOLOG  227 (H)  2 units NOVOLOG    Results for Margaret Butler, Margaret Butler (MRN [id number]) as of 02/14/2019 10:54  Ref. Range 02/14/2019 06:53  Glucose-Capillary Latest Ref Range: 70 - 99 mg/dL 141 (H)  1 unit NOVOLOG    Results for Margaret Butler, Margaret Butler (MRN [id number]) as of 02/14/2019 10:54  Ref. Range 02/13/2019 10:45  Hemoglobin A1C Latest Ref Range: 4.8 - 5.6 % 7.2 (H)  (159 mg/dl)    Admit with: Expressive aphasia/symptomatic left carotid stenosis/ HTN Urgency  New Diagnosis of Diabetes this admission  Current Orders: Novolog Sensitive Correction Scale/ SSI (0-9 units) TID AC + HS    MD- please give pt a Rx for CBG meter at time of discharge  Order #[id number]   New Diagnosis of Diabetes.  Per MD notes, patient does not want to start therapy until she speaks with her PCP.  PCP: Dr. Karle Starch with [facility] in [place] [facility]  Will order educational materials and ask RD to see pt as well for diet education.     Addendum 12:30pm-   Pt told me she was given a CBG meter several years ago by her PCP but was told to stop checking her CBGs when her A1c came back at 5.2%.  Feels comfortable checking CBGs at home and is willing to check after discharge.    Spoke with pt about new diagnosis.  Discussed A1C results with her and explained what an A1C is, basic pathophysiology of DM Type 2, basic home care, basic diabetes diet nutrition principles, importance of checking CBGs and  maintaining good CBG control to prevent long-term and short-term complications.  Reviewed signs and symptoms of hyperglycemia and hypoglycemia and how to treat hypoglycemia at home.  Also reviewed blood sugar goals and A1c goals for home.    Discussed with pt that she may be able to manage her CBGs with nutrition and exercise alone.  Pt has PCP wellness visit in August and told me she will talk with her PCP about her CBGs and A1c at that visit.  Also discussed DM diet information with patient.  Encouraged patient to avoid beverages with sugar (regular soda, sweet tea, lemonade, fruit juice) and to consume mostly water.  Discussed what foods contain carbohydrates and how carbohydrates affect the body's blood sugar levels.  Encouraged patient to be careful with her portion sizes (especially grains, starchy vegetables, and fruits).  Explained to patient that women should have 45-60 grams of carbohydrates per meal per day.  Encouraged pt to look at the book I have ordered for her and also encouraged pt to peruse the American Diabetes Association website.  Told pt I will ask the MD to give her a Rx for CBG meter at time of d/c.  If she does not get a Rx, instructed pt to go to [employer] and buy CBG meter OTC.  RNs to provide ongoing basic DM education at bedside with this patient.  Have ordered educational booklet.  Have also placed RD consult for DM diet education for this patient.    --Will follow patient during hospitalization--  Ambrose FinlandJeannine Johnston Aden Youngman RN, MSN, CDE Diabetes Coordinator Inpatient Glycemic Control Team Team Pager: [phone] (8a-5p)

## 2019-02-15 ENCOUNTER — Encounter (HOSPITAL_COMMUNITY)
Admission: EM | Disposition: A | Discharge status: Home Health | Payer: Self-pay | Source: Home / Self Care | Attending: Internal Medicine

## 2019-02-15 ENCOUNTER — Inpatient Hospital Stay (HOSPITAL_COMMUNITY): Payer: Medicare Other | Admitting: Certified Registered"

## 2019-02-15 ENCOUNTER — Encounter (HOSPITAL_COMMUNITY): Payer: Self-pay

## 2019-02-15 DIAGNOSIS — I6523 Occlusion and stenosis of bilateral carotid arteries: Secondary | ICD-10-CM

## 2019-02-15 HISTORY — PX: ENDARTERECTOMY: SHX5162

## 2019-02-15 HISTORY — PX: PATCH ANGIOPLASTY: SHX6230

## 2019-02-15 LAB — GLUCOSE, CAPILLARY
Glucose-Capillary: 152 mg/dL — ABNORMAL HIGH (ref 70–99)
Glucose-Capillary: 141 mg/dL — ABNORMAL HIGH (ref 70–99)
Glucose-Capillary: 272 mg/dL — ABNORMAL HIGH (ref 70–99)

## 2019-02-15 LAB — POCT ACTIVATED CLOTTING TIME: Activated Clotting Time: 274 seconds

## 2019-02-15 SURGERY — ENDARTERECTOMY, CAROTID
Anesthesia: General | Site: Neck | Laterality: Left

## 2019-02-15 MED ORDER — FENTANYL CITRATE (PF) 250 MCG/5ML IJ SOLN
INTRAMUSCULAR | Status: AC
  Filled 2019-02-15: qty 5

## 2019-02-15 MED ORDER — SUGAMMADEX SODIUM 200 MG/2ML IV SOLN
INTRAVENOUS | Status: DC | PRN
  Administered 2019-02-15: 200 mg via INTRAVENOUS

## 2019-02-15 MED ORDER — GUAIFENESIN-DM 100-10 MG/5ML PO SYRP: 15.0000 mL | ORAL_SOLUTION | ORAL | Status: DC | PRN

## 2019-02-15 MED ORDER — LIDOCAINE HCL (PF) 1 % IJ SOLN
INTRAMUSCULAR | Status: DC | PRN
  Administered 2019-02-15: 30 mL

## 2019-02-15 MED ORDER — LACTATED RINGERS IV SOLN
INTRAVENOUS | Status: DC | PRN
  Administered 2019-02-15: 10:00:00 via INTRAVENOUS

## 2019-02-15 MED ORDER — METOPROLOL TARTRATE 5 MG/5ML IV SOLN: 2.0000 mg | INTRAVENOUS | Status: DC | PRN

## 2019-02-15 MED ORDER — HYDRALAZINE HCL 20 MG/ML IJ SOLN
5.0000 mg | INTRAMUSCULAR | Status: AC | PRN
  Administered 2019-02-15 (×2): 5 mg via INTRAVENOUS
  Filled 2019-02-15 (×2): qty 1

## 2019-02-15 MED ORDER — LIDOCAINE HCL 4 % MT SOLN
OROMUCOSAL | Status: DC | PRN
  Administered 2019-02-15: 4 mL via TOPICAL

## 2019-02-15 MED ORDER — PROMETHAZINE HCL 25 MG/ML IJ SOLN: 6.2500 mg | INTRAMUSCULAR | Status: DC | PRN

## 2019-02-15 MED ORDER — FENTANYL CITRATE (PF) 100 MCG/2ML IJ SOLN: 25.0000 ug | INTRAMUSCULAR | Status: DC | PRN

## 2019-02-15 MED ORDER — EPHEDRINE SULFATE 50 MG/ML IJ SOLN
INTRAMUSCULAR | Status: DC | PRN
  Administered 2019-02-15: 10 mg via INTRAVENOUS

## 2019-02-15 MED ORDER — ONDANSETRON HCL 4 MG/2ML IJ SOLN
4.0000 mg | Freq: Four times a day (QID) | INTRAMUSCULAR | Status: DC | PRN
  Administered 2019-02-16: 4 mg via INTRAVENOUS
  Filled 2019-02-15: qty 2

## 2019-02-15 MED ORDER — OXYCODONE-ACETAMINOPHEN 5-325 MG PO TABS: 1.0000 | ORAL_TABLET | ORAL | Status: DC | PRN

## 2019-02-15 MED ORDER — 0.9 % SODIUM CHLORIDE (POUR BTL) OPTIME
TOPICAL | Status: DC | PRN
  Administered 2019-02-15: 12:00:00 2000 mL

## 2019-02-15 MED ORDER — PHENYLEPHRINE HCL (PRESSORS) 10 MG/ML IV SOLN
INTRAVENOUS | Status: DC | PRN
  Administered 2019-02-15 (×2): 20 ug via INTRAVENOUS
  Administered 2019-02-15: 120 ug via INTRAVENOUS
  Administered 2019-02-15: 20 ug via INTRAVENOUS

## 2019-02-15 MED ORDER — PROPOFOL 10 MG/ML IV BOLUS
INTRAVENOUS | Status: AC
  Filled 2019-02-15: qty 20

## 2019-02-15 MED ORDER — NITROGLYCERIN IN D5W 200-5 MCG/ML-% IV SOLN
INTRAVENOUS | Status: DC | PRN
  Administered 2019-02-15: 50 ug/min via INTRAVENOUS

## 2019-02-15 MED ORDER — ENOXAPARIN SODIUM 40 MG/0.4ML ~~LOC~~ SOLN
40.0000 mg | Freq: Every day | SUBCUTANEOUS | Status: DC
  Administered 2019-02-16: 40 mg via SUBCUTANEOUS
  Filled 2019-02-15: qty 0.4

## 2019-02-15 MED ORDER — SODIUM CHLORIDE 0.9 % IV SOLN
INTRAVENOUS | Status: DC | PRN
  Administered 2019-02-15: 25 ug/min via INTRAVENOUS

## 2019-02-15 MED ORDER — HEPARIN SODIUM (PORCINE) 1000 UNIT/ML IJ SOLN
INTRAMUSCULAR | Status: DC | PRN
  Administered 2019-02-15: 7000 [IU] via INTRAVENOUS

## 2019-02-15 MED ORDER — HEMOSTATIC AGENTS (NO CHARGE) OPTIME
TOPICAL | Status: DC | PRN
  Administered 2019-02-15: 1 via TOPICAL

## 2019-02-15 MED ORDER — PROTAMINE SULFATE 10 MG/ML IV SOLN
INTRAVENOUS | Status: DC | PRN
  Administered 2019-02-15: 50 mg via INTRAVENOUS

## 2019-02-15 MED ORDER — SODIUM CHLORIDE 0.9 % IV SOLN
INTRAVENOUS | Status: AC
  Filled 2019-02-15: qty 1.2

## 2019-02-15 MED ORDER — LIDOCAINE HCL (PF) 1 % IJ SOLN
INTRAMUSCULAR | Status: AC
  Filled 2019-02-15: qty 30

## 2019-02-15 MED ORDER — SODIUM CHLORIDE 0.9 % IV SOLN
INTRAVENOUS | Status: DC
  Administered 2019-02-15: 19:00:00 via INTRAVENOUS

## 2019-02-15 MED ORDER — PHENOL 1.4 % MT LIQD: 1.0000 | OROMUCOSAL | Status: DC | PRN

## 2019-02-15 MED ORDER — CEFAZOLIN SODIUM-DEXTROSE 2-4 GM/100ML-% IV SOLN
2.0000 g | Freq: Three times a day (TID) | INTRAVENOUS | Status: AC
  Administered 2019-02-15 – 2019-02-16 (×2): 2 g via INTRAVENOUS
  Filled 2019-02-15 (×2): qty 100

## 2019-02-15 MED ORDER — ACETAMINOPHEN 500 MG PO TABS
1000.0000 mg | ORAL_TABLET | Freq: Once | ORAL | Status: AC
  Administered 2019-02-15: 1000 mg via ORAL
  Filled 2019-02-15: qty 2

## 2019-02-15 MED ORDER — ONDANSETRON HCL 4 MG/2ML IJ SOLN
INTRAMUSCULAR | Status: DC | PRN
  Administered 2019-02-15: 4 mg via INTRAVENOUS

## 2019-02-15 MED ORDER — CLOPIDOGREL BISULFATE 75 MG PO TABS: 75.0000 mg | ORAL_TABLET | Freq: Every day | ORAL | Status: DC

## 2019-02-15 MED ORDER — SODIUM CHLORIDE 0.9 % IV SOLN
INTRAVENOUS | Status: DC | PRN
  Administered 2019-02-15: 500 mL

## 2019-02-15 MED ORDER — DEXAMETHASONE SODIUM PHOSPHATE 10 MG/ML IJ SOLN
INTRAMUSCULAR | Status: DC | PRN
  Administered 2019-02-15: 10 mg via INTRAVENOUS

## 2019-02-15 MED ORDER — FENTANYL CITRATE (PF) 250 MCG/5ML IJ SOLN
INTRAMUSCULAR | Status: DC | PRN
  Administered 2019-02-15 (×4): 25 ug via INTRAVENOUS
  Administered 2019-02-15: 100 ug via INTRAVENOUS

## 2019-02-15 MED ORDER — PROPOFOL 10 MG/ML IV BOLUS
INTRAVENOUS | Status: DC | PRN
  Administered 2019-02-15: 130 mg via INTRAVENOUS

## 2019-02-15 MED ORDER — MORPHINE SULFATE (PF) 2 MG/ML IV SOLN
2.0000 mg | INTRAVENOUS | Status: DC | PRN
  Administered 2019-02-16: 2 mg via INTRAVENOUS
  Filled 2019-02-15: qty 1

## 2019-02-15 MED ORDER — ROCURONIUM BROMIDE 10 MG/ML (PF) SYRINGE
PREFILLED_SYRINGE | INTRAVENOUS | Status: DC | PRN
  Administered 2019-02-15: 50 mg via INTRAVENOUS

## 2019-02-15 MED ORDER — SUCCINYLCHOLINE CHLORIDE 200 MG/10ML IV SOSY
PREFILLED_SYRINGE | INTRAVENOUS | Status: DC | PRN
  Administered 2019-02-15: 100 mg via INTRAVENOUS

## 2019-02-15 MED ORDER — SODIUM CHLORIDE 0.9 % IV SOLN: 500.0000 mL | Freq: Once | INTRAVENOUS | Status: DC | PRN

## 2019-02-15 SURGICAL SUPPLY — 53 items
CANISTER SUCT 3000ML PPV (MISCELLANEOUS) ×3 IMPLANT
CATH ROBINSON RED A/P 18FR (CATHETERS) ×3 IMPLANT
CLIP VESOCCLUDE MED 24/CT (CLIP) ×3 IMPLANT
CLIP VESOCCLUDE SM WIDE 24/CT (CLIP) ×3 IMPLANT
COVER PROBE W GEL 5X96 (DRAPES) ×3 IMPLANT
COVER WAND RF STERILE (DRAPES) ×3 IMPLANT
DERMABOND ADVANCED (GAUZE/BANDAGES/DRESSINGS) ×2
DERMABOND ADVANCED .7 DNX12 (GAUZE/BANDAGES/DRESSINGS) ×1 IMPLANT
DRAIN CHANNEL 15F RND FF W/TCR (WOUND CARE) IMPLANT
ELECT REM PT RETURN 9FT ADLT (ELECTROSURGICAL) ×3
ELECTRODE REM PT RTRN 9FT ADLT (ELECTROSURGICAL) ×1 IMPLANT
EVACUATOR SILICONE 100CC (DRAIN) IMPLANT
GAUZE SPONGE 4X4 12PLY STRL LF (GAUZE/BANDAGES/DRESSINGS) ×2 IMPLANT
GLOVE BIO SURGEON STRL SZ 6.5 (GLOVE) ×1 IMPLANT
GLOVE BIO SURGEON STRL SZ7.5 (GLOVE) ×5 IMPLANT
GLOVE BIO SURGEONS STRL SZ 6.5 (GLOVE) ×1
GLOVE BIOGEL PI IND STRL 6.5 (GLOVE) IMPLANT
GLOVE BIOGEL PI INDICATOR 6.5 (GLOVE) ×4
GLOVE ECLIPSE 8.0 STRL XLNG CF (GLOVE) ×2 IMPLANT
GOWN STRL REUS W/ TWL LRG LVL3 (GOWN DISPOSABLE) ×2 IMPLANT
GOWN STRL REUS W/ TWL XL LVL3 (GOWN DISPOSABLE) ×1 IMPLANT
GOWN STRL REUS W/TWL LRG LVL3 (GOWN DISPOSABLE) ×6
GOWN STRL REUS W/TWL XL LVL3 (GOWN DISPOSABLE) ×2
HEMOSTAT SNOW SURGICEL 2X4 (HEMOSTASIS) ×2 IMPLANT
INSERT FOGARTY SM (MISCELLANEOUS) ×3 IMPLANT
IV ADAPTER SYR DOUBLE MALE LL (MISCELLANEOUS) IMPLANT
KIT BASIN OR (CUSTOM PROCEDURE TRAY) ×3 IMPLANT
KIT SHUNT ARGYLE CAROTID ART 6 (VASCULAR PRODUCTS) ×2 IMPLANT
KIT TURNOVER KIT B (KITS) ×3 IMPLANT
NDL HYPO 25GX1X1/2 BEV (NEEDLE) IMPLANT
NDL SPNL 20GX3.5 QUINCKE YW (NEEDLE) IMPLANT
NEEDLE HYPO 25GX1X1/2 BEV (NEEDLE) ×3 IMPLANT
NEEDLE SPNL 20GX3.5 QUINCKE YW (NEEDLE) IMPLANT
NS IRRIG 1000ML POUR BTL (IV SOLUTION) ×9 IMPLANT
PACK CAROTID (CUSTOM PROCEDURE TRAY) ×3 IMPLANT
PAD ARMBOARD 7.5X6 YLW CONV (MISCELLANEOUS) ×6 IMPLANT
PATCH HEMASHIELD 8X75 (Vascular Products) ×2 IMPLANT
POSITIONER HEAD DONUT 9IN (MISCELLANEOUS) ×3 IMPLANT
SHUNT CAROTID BYPASS 10 (VASCULAR PRODUCTS) IMPLANT
SHUNT CAROTID BYPASS 12FRX15.5 (VASCULAR PRODUCTS) IMPLANT
STOPCOCK 4 WAY LG BORE MALE ST (IV SETS) IMPLANT
SUT ETHILON 3 0 PS 1 (SUTURE) IMPLANT
SUT MNCRL AB 4-0 PS2 18 (SUTURE) ×3 IMPLANT
SUT PROLENE 6 0 BV (SUTURE) ×9 IMPLANT
SUT SILK 3 0 (SUTURE)
SUT SILK 3-0 18XBRD TIE 12 (SUTURE) IMPLANT
SUT VIC AB 3-0 SH 27 (SUTURE) ×2
SUT VIC AB 3-0 SH 27X BRD (SUTURE) ×1 IMPLANT
SYR CONTROL 10ML LL (SYRINGE) ×2 IMPLANT
TAPE CLOTH SURG 4X10 WHT LF (GAUZE/BANDAGES/DRESSINGS) ×2 IMPLANT
TOWEL GREEN STERILE (TOWEL DISPOSABLE) ×3 IMPLANT
TUBING ART PRESS 48 MALE/FEM (TUBING) IMPLANT
WATER STERILE IRR 1000ML POUR (IV SOLUTION) ×3 IMPLANT

## 2019-02-15 NOTE — Anesthesia Procedure Notes (Addendum)
Procedure Name: Intubation Date/Time: 02/15/2019 11:15 AM Performed by: Marsa Aris, CRNA Pre-anesthesia Checklist: Patient identified, Emergency Drugs available, Suction available and Patient being monitored Patient Re-evaluated:Patient Re-evaluated prior to induction Oxygen Delivery Method: Circle System Utilized Preoxygenation: Pre-oxygenation with 100% oxygen Induction Type: IV induction Ventilation: Mask ventilation without difficulty Laryngoscope Size: Miller and 2 Grade View: Grade III Tube type: Oral Number of attempts: 1 Airway Equipment and Method: Stylet and Oral airway Placement Confirmation: ETT inserted through vocal cords under direct vision,  positive ETCO2 and breath sounds checked- equal and bilateral Secured at: 22 cm Tube secured with: Tape Dental Injury: Teeth and Oropharynx as per pre-operative assessment

## 2019-02-15 NOTE — Progress Notes (Signed)
Nutrition Brief Note  RD consulted for diet education regarding new onset type 2 diabetes mellitus. Last HbA1c 7.2%. Pt is currently unavailable in OR undergoing carotid endarterectomy. RD to attach handout "Carbohydrate Counting for People with Diabetes" from the Academy of  Nutrition and Dietetics Manual to pt discharge instructions. RD to follow up for diet education as appropriate.   Corrin Parker, MS, RD, LDN Pager # [phone] After hours/ weekend pager # [phone]

## 2019-02-15 NOTE — Anesthesia Postprocedure Evaluation (Signed)
Anesthesia Post Note  Patient: Margaret Butler  Procedure(s) Performed: ENDARTERECTOMY CAROTID (Left Neck) Patch Angioplasty Using Hemoshield patch size 0.8cm x 7.6cm (Left Neck)     Patient location during evaluation: PACU Anesthesia Type: General Level of consciousness: sedated Pain management: pain level controlled Vital Signs Assessment: post-procedure vital signs reviewed and stable Respiratory status: spontaneous breathing and respiratory function stable Cardiovascular status: stable Postop Assessment: no apparent nausea or vomiting Anesthetic complications: no    Last Vitals:  Vitals:   02/15/19 1440 02/15/19 1455  BP: (!) 143/53 (!) 139/54  Pulse: 80 91  Resp: 14 14  Temp:    SpO2: 92% 98%    Last Pain:  Vitals:   02/15/19 1455  TempSrc:   PainSc: 0-No pain                 Dominique Calvey DANIEL

## 2019-02-15 NOTE — Anesthesia Preprocedure Evaluation (Addendum)
Anesthesia Evaluation  Patient identified by MRN, date of birth, ID band Patient awake    Reviewed: Allergy & Precautions, NPO status , Patient's Chart, lab work & pertinent test results  History of Anesthesia Complications Negative for: history of anesthetic complications  Airway Mallampati: II  TM Distance: >3 FB Neck ROM: Full    Dental no notable dental hx. (+) Dental Advisory Given   Pulmonary neg pulmonary ROS,    Pulmonary exam normal        Cardiovascular hypertension, Pt. on medications and Pt. on home beta blockers + Peripheral Vascular Disease  Normal cardiovascular exam  IMPRESSIONS    1. The left ventricle has normal systolic function with an ejection fraction of 60-65%. The cavity size was normal. There is moderately increased left ventricular wall thickness. Left ventricular diastolic Doppler parameters are consistent with impaired  relaxation. Elevated mean left atrial pressure.  2. The right ventricle has normal systolic function. The cavity was normal.  3. The mitral valve is grossly normal. There is moderate mitral annular calcification present.  4. Trivial pericardial effusion is present.  5. The aortic valve is tricuspid. Mild calcification of the aortic valve. Aortic valve regurgitation is trivial by color flow Doppler. No stenosis of the aortic valve.  6. Normal LV systolic function; moderate LVH; mild diastolic dysfunction; sclerotic aortic valve with trace AI; negative saline microcavitation study.   Neuro/Psych negative neurological ROS  negative psych ROS   GI/Hepatic negative GI ROS, Neg liver ROS,   Endo/Other  negative endocrine ROS  Renal/GU negative Renal ROS     Musculoskeletal negative musculoskeletal ROS (+)   Abdominal   Peds  Hematology negative hematology ROS (+)   Anesthesia Other Findings Day of surgery medications reviewed with the patient.  Reproductive/Obstetrics                             Anesthesia Physical Anesthesia Plan  ASA: III  Anesthesia Plan: General   Post-op Pain Management:    Induction: Intravenous  PONV Risk Score and Plan: 3 and Dexamethasone, Ondansetron and Diphenhydramine  Airway Management Planned: Oral ETT  Additional Equipment:   Intra-op Plan:   Post-operative Plan: Extubation in OR  Informed Consent: I have reviewed the patients History and Physical, chart, labs and discussed the procedure including the risks, benefits and alternatives for the proposed anesthesia with the patient or authorized representative who has indicated his/her understanding and acceptance.     Dental advisory given  Plan Discussed with: CRNA and Anesthesiologist  Anesthesia Plan Comments:         Anesthesia Quick Evaluation

## 2019-02-15 NOTE — Op Note (Signed)
° ° °  Patient name: Margaret Butler MRN: [id number] DOB: 1940/12/04 Sex: female  02/15/2019 Pre-operative Diagnosis: Symptomatic left internal carotid artery stenosis Post-operative diagnosis:  Same Surgeon:  Erlene Quan C. Donzetta Matters, MD Assistant: Arlee Muslim, PA Procedure Performed:  Left internal carotid endarterectomy with Dacron patch angioplasty  Indications: 78 year old female presented with TIA.  All symptoms resolved.  She was found to have high-grade stenosis of her left carotid was thought to be the causative lesion she was indicated for left carotid endarterectomy.  Findings: There is a highly calcified with soft plaque lesion right at the bifurcation.  At completion there was expected signal in the internal carotid artery there was a mild flow signal in the external carotid artery.   Procedure:  The patient was identified in the holding area and taken to room where she is placed upon operative when general anesthesia induced.  She was to the prepped draped in the neck and chest in usual fashion antibiotics administered timeout called.  Incision was made along the anterior border the sternocleidomastoid dissected down to the facial vein.  Branches were divided between ties.  We identify the common carotid artery patient was fully heparinized and umbilical tape placed around this.  We identified a superior thyroidal branch placed a vessel loop around this followed by the external carotid artery which was also encircled with vessel loop hypoglossal nerve was identified.  We dissected out the internal carotid artery placed a vessel loop around this.  ACT returned greater than 250.  Pressure was map greater than 90.  We prepared a shunt.  We clamped the internal followed by the common and external carotid arteries.  We open the internal down onto the common longitudinally.  Shunt was placed distally allowed to backbleed placed proximally into the common carotid artery.  Flow is confirmed with Doppler.  We  proceeded with endarterectomy of the common onto the internal carotid artery with eversion of the external.  We had good backbleeding from the external.  Dacryon patch was sewn in place.  Prior to completing her suture line we removed the shunt.  We allowed antegrade backbleeding.  We irrigated with heparinized saline in the carotid bulb.  Anastomosis was completed.  We first released our external carotid artery we did have to place a few repair stitches.  We released our common after few cardiac cycles are internal.  There is expected signal in the internal carotid artery.  50 mg of protamine was administered.  We obtained hemostasis irrigated the wound.  We closed the platysma with Vicryl followed by skin with Monocryl and Dermabond placed to level the skin.  She was awakened from anesthesia was moving all her extremities and her tongue was midline.  She was transferred to the bed and transferred to the recovery room without immediate complication.  All counts were correct at completion.   EBL: 200cc    Sherrye Puga C. Donzetta Matters, MD [facility] Office: [phone] Pager: [phone]

## 2019-02-15 NOTE — Anesthesia Procedure Notes (Signed)
Arterial Line Insertion Start/End7/02/2019 10:16 AM, 02/15/2019 10:26 AM Performed by: Kyung Rudd, CRNA, CRNA  Patient location: Pre-op. Preanesthetic checklist: patient identified, IV checked, site marked, risks and benefits discussed, surgical consent, monitors and equipment checked, pre-op evaluation and anesthesia consent Right, radial was placed Catheter size: 20 G Hand hygiene performed , maximum sterile barriers used  and Seldinger technique used Allen's test indicative of satisfactory collateral circulation Attempts: 2 Procedure performed without using ultrasound guided technique. Ultrasound Notes:anatomy identified, needle tip was noted to be adjacent to the nerve/plexus identified and no ultrasound evidence of intravascular and/or intraneural injection Following insertion, dressing applied and Biopatch. Post procedure assessment: normal  Patient tolerated the procedure well with no immediate complications.

## 2019-02-15 NOTE — Discharge Instructions (Signed)
[facility]  Discharge Instructions   Carotid Endarterectomy (CEA)  Please refer to the following instructions for your post-procedure care. Your surgeon or physician assistant will discuss any changes with you.  Activity  You are encouraged to walk as much as you can. You can slowly return to normal activities but must avoid strenuous activity and heavy lifting until your doctor tell you it's OK. Avoid activities such as vacuuming or swinging a golf club. You can drive after one week if you are comfortable and you are no longer taking prescription pain medications. It is normal to feel tired for serval weeks after your surgery. It is also normal to have difficulty with sleep habits, eating, and bowel movements after surgery. These will go away with time.  Bathing/Showering  You may shower after you come home. Do not soak in a bathtub, hot tub, or swim until the incision heals completely.  Incision Care  Shower every day. Clean your incision with mild soap and water. Pat the area dry with a clean towel. You do not need a bandage unless otherwise instructed. Do not apply any ointments or creams to your incision. You may have skin glue on your incision. Do not peel it off. It will come off on its own in about one week. Your incision may feel thickened and raised for several weeks after your surgery. This is normal and the skin will soften over time. For Men Only: It's OK to shave around the incision but do not shave the incision itself for 2 weeks. It is common to have numbness under your chin that could last for several months.  Diet  Resume your normal diet. There are no special food restrictions following this procedure. A low fat/low cholesterol diet is recommended for all patients with vascular disease. In order to heal from your surgery, it is CRITICAL to get adequate nutrition. Your body requires vitamins, minerals, and protein. Vegetables are the best  source of vitamins and minerals. Vegetables also provide the perfect balance of protein. Processed food has little nutritional value, so try to avoid this.        Medications  Resume taking all of your medications unless your doctor or physician assistant tells you not to. If your incision is causing pain, you may take over-the- counter pain relievers such as acetaminophen (Tylenol). If you were prescribed a stronger pain medication, please be aware these medications can cause nausea and constipation. Prevent nausea by taking the medication with a snack or meal. Avoid constipation by drinking plenty of fluids and eating foods with a high amount of fiber, such as fruits, vegetables, and grains. Do not take Tylenol if you are taking prescription pain medications.  Follow Up  Our office will schedule a follow up appointment 2-3 weeks following discharge.  Please call us immediately for any of the following conditions  Increased pain, redness, drainage (pus) from your incision site. Fever of 101 degrees or higher. If you should develop stroke (slurred speech, difficulty swallowing, weakness on one side of your body, loss of vision) you should call 911 and go to the nearest emergency room.  Reduce your risk of vascular disease:  Stop smoking. If you would like help call [facility] at 1-800-QUIT-NOW [phone]) or [place] at [phone]. Manage your cholesterol Maintain a desired weight Control your diabetes Keep your blood pressure down  If you have any questions, please call the office at [phone].   Fingerstick glucose (sugar) goals for home:  Before meals: 80-130 mg/dl 2-Hours after meals: less than 180 mg/dl Hemoglobin N5AA1c goal: 7% or less  Diabetes Diet info: Avoid beverages with sugar (regular soda, sweet tea, lemonade, fruit juice) and to consume mostly water.    Be careful with her portion sizes (especially grains, starchy vegetables, and fruits).   Women  should have 45-60 grams of carbohydrates per meal per day.   Carbohydrate Counting for People with Diabetes  Why Is Carbohydrate Counting Important?   Counting carbohydrate servings may help you control your blood glucose level so that you feel better.   The balance between the carbohydrates you eat and insulin determines what your blood glucose level will be after eating.   Carbohydrate counting can also help you plan your meals.   Which Foods Have Carbohydrates?  Foods with carbohydrates include:   Breads, crackers, and cereals   Pasta, rice, and grains   Starchy vegetables, such as potatoes, corn, and peas   Beans and legumes   Milk, soy milk, and yogurt   Fruits and fruit juices   Sweets, such as cakes, cookies, ice cream, jam, and jelly   Carbohydrate Servings  In diabetes meal planning, 1 serving of a food with carbohydrate has about 15 grams of carbohydrate:   Check serving sizes with measuring cups and spoons or a food scale.   Read the Nutrition Facts on food labels to find out how many grams of carbohydrate are in foods you eat.   The food lists in this handout show portions that have about 15 grams of carbohydrate.   Meal Planning Tips   An Eating Plan tells you how many carbohydrate servings to eat at your meals and snacks. For many adults, eating 3 to 5 servings of carbohydrate foods at each meal and 1 or 2 carbohydrate servings for each snack works well.   In a healthy daily Eating Plan, most carbohydrates come from:  o At least 6 servings of fruits and nonstarchy vegetables  o At least 6 servings of grains, beans, and starchy vegetables, with at least 3 servings from whole grains  o At least 2 servings of milk or milk products   Check your blood glucose level regularly. It can tell you if you need to adjust when you eat carbohydrates.   Eating foods that have fiber, such as whole grains, and having very few salty foods is good for your health.   Eat 4  to 6 ounces of meat or other protein foods (such as soybean burgers) each day. Choose low-fat sources of protein, such as lean beef, lean pork, chicken, fish, low-fat cheese, or vegetarian foods such as soy.   Eat some healthy fats, such as olive oil, canola oil, and nuts.   Eat very little saturated fats. These unhealthy fats are found in butter, cream, and high-fat meats, such as bacon and sausage.   Eat very little or no trans fats. These unhealthy fats are found in all foods that list partially hydrogenated oil as an ingredient.   Label Reading Tips  The Nutrition Facts panel on a label lists the grams of total carbohydrate in 1 standard serving. The labels standard serving may be larger or smaller than 1 carbohydrate serving.  To figure out how many carbohydrate servings are in the food:   First, look at the labels standard serving size.   Check the grams of total carbohydrate. This is the amount of carbohydrate in 1 standard serving.   Divide the grams of total carbohydrate  by 15. This number equals the number of carbohydrate servings in 1 standard serving. Remember: 1 carbohydrate serving is 15 grams of carbohydrate.   Note: You may ignore the grams of sugars on the Nutrition Facts panel because they are included in the grams of total carbohydrate.   Food Lists for Carbohydrate Counting  1 serving = about 15 grams of carbohydrate  Starches   1 slice bread (1 ounce)   1 tortilla (6-inch size)    large bagel (1 ounce)   2 taco shells (5-inch size)    hamburger or hot dog bun ( ounce)    cup ready-to-eat unsweetened cereal    cup cooked cereal   1 cup broth-based soup   4 to 6 small crackers   ? cup pasta or rice (cooked)    cup beans, peas, corn, sweet potatoes, winter squash, or mashed or boiled potatoes (cooked)    large baked potato (3 ounces)    ounce pretzels, potato chips, or tortilla chips   3 cups popcorn (popped)   Sweets and Desserts    2-inch square cake (unfrosted)   2 small cookies (? ounce)    cup ice cream or frozen yogurt    cup sherbet or sorbet   1 tablespoon syrup, jam, jelly, table sugar, or honey   2 tablespoons light syrup   Milk   1 cup fat-free or reduced-fat milk   1 cup soy milk   ? cup (6 ounces) nonfat yogurt sweetened with sugar-free sweetener   Fruit   1 small fresh fruit ( to 1 cup)    cup canned or frozen fruit   2 tablespoons dried fruit (blueberries, cherries, cranberries, mixed fruit, raisins)   17 small grapes (3 ounces)   1 cup melon or berries    cup unsweetened fruit juice   Other Foods   Count 1 cup raw vegetables or  cup cooked nonstarchy vegetables as zero (0) carbohydrate servings or free foods. If you eat 3 or more servings at one meal, count them as 1 carbohydrate serving.   Foods that have less than 20 calories in each serving also may be counted as zero carbohydrate servings or free foods.   Count 1 cup of casserole or other mixed foods as 2 carbohydrate servings.   Carbohydrate Counting for People with Diabetes Sample 1-Day Menu Menu   Breakfast  1 extra-small banana (1 carbohydrate serving)   cup corn flakes (1 carbohydrate serving) with 1 cup low-fat or fat-free milk (1 carbohydrate serving)  1 slice whole wheat bread (1 carbohydrate serving) with 1 teaspoon margarine    Lunch   2 ounces [place] slices with 2 lettuce leaves on 2 slices whole wheat bread (2 carbohydrate servings)  4 celery sticks  4 carrot sticks  1 medium apple (1 carbohydrate serving)  1 cup low-fat or fat-free milk (1 carbohydrate serving)    Afternoon Snack   2 tablespoons raisins (1 carbohydrate serving)   ounce unsalted mini pretzels (1 carbohydrate serving)    Evening Meal   3 ounces lean roast beef  1/2 large baked potato (2 carbohydrate servings) with 1 tablespoon reduced-fat sour cream  1/2 cup green beans  1 cup vegetable salad with 1 tablespoon light salad  dressing  1 whole wheat dinner roll (1 carbohydrate serving) with 1 teaspoon margarine  1 cup melon balls (1 carbohydrate serving)    Evening Snack   6 ounces low-fat sugar-free fruit yogurt (1 carbohydrate serving)  2 tablespoons unsalted nuts  Roslyn SmilingStephanie Craig, MS, RD, LDN Clinical Dietitian Office phone #[phone]

## 2019-02-15 NOTE — Progress Notes (Signed)
  Progress Note    02/15/2019 10:29 AM Day of Surgery  Subjective: No overnight issues  Vitals:   02/15/19 0017 02/15/19 0700  BP: (!) 171/66 (!) 176/114  Pulse: 67 66  Resp: 16   Temp: 97.6 F (36.4 C) 98.4 F (36.9 C)  SpO2: 95% 100%    Physical Exam: Awake alert oriented Moving all extremities without limitation Tongue is midline  CBC    Component Value Date/Time   WBC 10.5 02/12/2019 1623   RBC 4.75 02/12/2019 1623   HGB 13.0 02/12/2019 1623   HCT 40.2 02/12/2019 1623   PLT 248 02/12/2019 1623   MCV 84.6 02/12/2019 1623   MCH 27.4 02/12/2019 1623   MCHC 32.3 02/12/2019 1623   RDW 13.2 02/12/2019 1623   LYMPHSABS 2.5 02/12/2019 1623   MONOABS 0.8 02/12/2019 1623   EOSABS 0.2 02/12/2019 1623   BASOSABS 0.1 02/12/2019 1623    BMET    Component Value Date/Time   NA 139 02/13/2019 1045   K 4.8 02/13/2019 1045   CL 99 02/13/2019 1045   CO2 27 02/13/2019 1045   GLUCOSE 240 (H) 02/13/2019 1045   BUN 13 02/13/2019 1045   CREATININE 0.56 02/13/2019 1045   CALCIUM 9.5 02/13/2019 1045   GFRNONAA >60 02/13/2019 1045   GFRAA >60 02/13/2019 1045    INR    Component Value Date/Time   INR 0.9 02/12/2019 1623     Intake/Output Summary (Last 24 hours) at 02/15/2019 1029 Last data filed at 02/14/2019 1350 Gross per 24 hour  Intake 360 ml  Output -  Net 360 ml     Assessment:  78 y.o. female is here with symptomatic left ICA stenosis.    Plan: OR today for left carotid endarterectomy for symptomatic disease.  I discussed the risk of stroke, cranial nerve injury, anesthetic complications and wound complications.  She again asks appropriate questions and all are answered.  We will contact her family after surgery.  Rykker Coviello C. Donzetta Matters, MD [facility] [place] Office: [phone] Pager: [phone]  02/15/2019 10:29 AM

## 2019-02-15 NOTE — Transfer of Care (Signed)
Immediate Anesthesia Transfer of Care Note  Patient: Margaret Butler  Procedure(s) Performed: ENDARTERECTOMY CAROTID (Left Neck) Patch Angioplasty Using Hemoshield patch size 0.8cm x 7.6cm (Left Neck)  Patient Location: PACU  Anesthesia Type:General  Level of Consciousness: awake, alert  and oriented  Airway & Oxygen Therapy: Patient Spontanous Breathing and Patient connected to face mask oxygen  Post-op Assessment: Report given to RN and Post -op Vital signs reviewed and stable  Post vital signs: Reviewed and stable  Last Vitals:  Vitals Value Taken Time  BP 148/57 02/15/19 1322  Temp    Pulse 82 02/15/19 1327  Resp 14 02/15/19 1327  SpO2 97 % 02/15/19 1327  Vitals shown include unvalidated device data.  Last Pain:  Vitals:   02/15/19 1325  TempSrc:   PainSc: (P) 0-No pain         Complications: No apparent anesthesia complications

## 2019-02-15 NOTE — Progress Notes (Signed)
This nurse called report to short stay nurse. McGrew

## 2019-02-15 NOTE — Progress Notes (Signed)
PROGRESS NOTE    Margaret Butler  BTD:[id number] DOB: Sep 14, 1940 DOA: 02/12/2019 PCP: System, Pcp Not In    Brief Narrative: 78 y.o.femalewith medical history significant forhypertension and hyperlipidemia, now presenting to the emergency department for evaluation of speech difficulty. Patient reports that she went to bed in her usual state last night, felt a little more fatigued than usual this morning, but was able to go about her usual activities, had been with her husband, they had lunch together and everything seemed to be normal, but at approximately 1:30 PM, she developed difficulty with her speech. She seemed to have difficulty finding the right words to use. She laid down for a nap, continued to have this problem when she woke up, and came to the ED for evaluation. She denies any headache, change in vision or hearing, or focal numbness or weakness. The patient reports that she had similar symptoms in the 1980s that resolved very quickly. Patient's husband followed me into the hall after the exam to note that the patient has had some problems over months to years with using the wrong word occasionally or forgetting the name of a family member or friend briefly, but none of these events are very brief and the patient becomes upset when her husband expresses concern about it. She denies any recent fevers, chills, cough, shortness of breath, or sick contacts.  ED Course:Upon arrival to the ED, patient is found to be afebrile, saturating well on room air, and hypertensive to 238/82. EKG features sinus rhythm with LVH and repolarization abnormality. Noncontrast head CT was negative for acute intracranial abnormality. Chemistry panel is notable for glucose of 176 and slight elevation in transaminases. CBC is unremarkable. CTA head and neck reveal severe age indeterminant stenoses in several areas but otherwise negative for large vessel occlusion. Blood pressure was treated with  nicardipine initially. Neurology was consulted by the ED physician and recommends a medical admission for further evaluation and management  Assessment & Plan:   Principal Problem:   Expressive aphasia Active Problems:   Hypertension   Hypertensive urgency   Elevated transaminase level   Hyperlipidemia   1.Expressive aphasia/symptomatic left carotid stenosis -Presents with expressive aphasia, which had been resolved completely by the time she came to the hospital.  -Head CT with no acute findings, CTA head & neck with several sites of severe age-indeterminate stenoses -Neurology much appreciated- -MRI of the brain shows no acute stroke -Echocardiogram ef 60 to 65 % -Carotid ultrasound 60 to 79% on the left and 40 to 59 %on the right -Fasting lipid profile LDL 122 and hemoglobin A1c is 7.2 PT recommends HHPT We will continue aspirin and Plavix for now. -Patient at high risk for imminent stroke with Left carotid stenosis and needs surgery asap -Patient to undergo carotid endarterectomy today by Dr. Donzetta Matters  2.Hypertension with hypertensive urgency-restart metoprolol and Micardis. -SBP was 238 initially, treated initially with nicardipine infusion  3.Hyperlipidemia -Continue statin and omega-3's  4 new onset type 2 dm patient does not want any therapy started until she speaks to her pcp hba1c 7.2   DVT prophylaxis:Lovenox Code Status:Full Family Communication: Attempted to call husband many times he did not pick up the phone dw son  Consults called:neurology and vascular surgery    Estimated body mass index is 26.7 kg/m as calculated from the following:   Height as of this encounter: 5\' 3"  (1.6 m).   Weight as of this encounter: 68.4 kg.    Subjective: Awake alert anxious to have  surgery no further problems with speech or localized weakness reported.  Overnight staff reports no acute issues.  Objective: Vitals:   02/14/19 1500 02/14/19 2001  02/15/19 0017 02/15/19 0700  BP: (!) 172/63 (!) 146/65 (!) 171/66 (!) 176/114  Pulse: 70 69 67 66  Resp: 18 15 16    Temp: 97.8 F (36.6 C) 98.1 F (36.7 C) 97.6 F (36.4 C) 98.4 F (36.9 C)  TempSrc: Axillary Oral Oral Axillary  SpO2: 99% 97% 95% 100%  Weight:      Height:        Intake/Output Summary (Last 24 hours) at 02/15/2019 1127 Last data filed at 02/14/2019 1350 Gross per 24 hour  Intake 360 ml  Output --  Net 360 ml   Filed Weights   02/12/19 1657  Weight: 68.4 kg    Examination:  General exam: Appears calm and comfortable  Respiratory system: Clear to auscultation. Respiratory effort normal. Cardiovascular system: S1 & S2 heard, RRR. No JVD, murmurs, rubs, gallops or clicks. No pedal edema. Gastrointestinal system: Abdomen is nondistended, soft and nontender. No organomegaly or masses felt. Normal bowel sounds heard. Central nervous system: Alert and oriented. No focal neurological deficits. Extremities: Symmetric 5 x 5 power. Skin: No rashes, lesions or ulcers Psychiatry: Judgement and insight appear normal. Mood & affect appropriate.     Data Reviewed: I have personally reviewed following labs and imaging studies  CBC: Recent Labs  Lab 02/12/19 1623  WBC 10.5  NEUTROABS 6.8  HGB 13.0  HCT 40.2  MCV 84.6  PLT 248   Basic Metabolic Panel: Recent Labs  Lab 02/12/19 1623 02/13/19 1045  NA 138 139  K 3.4* 4.8  CL 100 99  CO2 26 27  GLUCOSE 176* 240*  BUN 16 13  CREATININE 0.41* 0.56  CALCIUM 9.1 9.5   GFR: Estimated Creatinine Clearance: 55.5 mL/min (by C-G formula based on SCr of 0.56 mg/dL). Liver Function Tests: Recent Labs  Lab 02/12/19 1623 02/13/19 1045  AST 42* 46*  ALT 68* 63*  ALKPHOS 55 44  BILITOT 0.4 0.9  PROT 6.8 6.7  ALBUMIN 3.8 3.6   No results for input(s): LIPASE, AMYLASE in the last 168 hours. No results for input(s): AMMONIA in the last 168 hours. Coagulation Profile: Recent Labs  Lab 02/12/19 1623  INR 0.9    Cardiac Enzymes: No results for input(s): CKTOTAL, CKMB, CKMBINDEX, TROPONINI in the last 168 hours. BNP (last 3 results) No results for input(s): PROBNP in the last 8760 hours. HbA1C: Recent Labs    02/13/19 1045  HGBA1C 7.2*   CBG: Recent Labs  Lab 02/14/19 1246 02/14/19 1617 02/14/19 2102 02/15/19 0607 02/15/19 0835  GLUCAP 145* 133* 190* 152* 141*   Lipid Profile: Recent Labs    02/13/19 1045  CHOL 226*  HDL 62  LDLCALC 122*  TRIG 209*  CHOLHDL 3.6   Thyroid Function Tests: No results for input(s): TSH, T4TOTAL, FREET4, T3FREE, THYROIDAB in the last 72 hours. Anemia Panel: No results for input(s): VITAMINB12, FOLATE, FERRITIN, TIBC, IRON, RETICCTPCT in the last 72 hours. Sepsis Labs: No results for input(s): PROCALCITON, LATICACIDVEN in the last 168 hours.  Recent Results (from the past 240 hour(s))  SARS Coronavirus 2 (Hosp order,Performed [facility] via [employer] ID)     Status: None   Collection Time: 02/12/19  4:33 PM   Specimen: Dry Nasal Swab ([employer] ID Now)  Result Value Ref Range Status   SARS Coronavirus 2 ([employer]ott ID Now) NEGATIVE NEGATIVE Final  Comment: (NOTE) SARS-CoV-2 target nucleic acids are NOT DETECTED. The SARS-CoV-2 RNA is generally detectable in upper and lower respiratory specimens during the acute phase of infection.  Negativeresults do not preclude SARS-CoV-2 infection, do not rule out coinfections with other pathogens, and should not be used as the  sole basis for treatment or other patient management decisions.  Negative results must be combined with clinical observations, patient history, and epidemiological information. The expected result is Negative. Fact Sheet for Patients: http://www.gra[place].org/https://www.fda.gov/media/136524/download Fact Sheet for Healthcare Providers: EnviroConcern.sihttps://www.fda.gov/media/136523/download This test is not yet approved or cleared by the [place] FDA and  has been authorized for detection and/or diagnosis of  SARS-CoV-2 by FDA under an Emergency Use Authorization (EUA).  This EUA will remain in effect (meaning this test can be used) for the duration of  the COVID19 declaration under Section 5 64(b)(1) of the Act, 21 U.S.C.  section [id number] 3(b)(1), unless the authorization is terminated or revoked sooner. Performed at [facility] [place], [address] [place], [place] [address]   MRSA PCR Screening     Status: None   Collection Time: 02/14/19 12:00 PM   Specimen: Nasal Mucosa; Nasopharyngeal  Result Value Ref Range Status   MRSA by PCR NEGATIVE NEGATIVE Final    Comment:        The GeneXpert MRSA Assay (FDA approved for NASAL specimens only), is one component of a comprehensive MRSA colonization surveillance program. It is not intended to diagnose MRSA infection nor to guide or monitor treatment for MRSA infections. Performed at [facility], [address] [place], [place] [address]          Radiology Studies: No results found.      Scheduled Meds:  [MAR Hold] aspirin  300 mg Rectal Daily   Or   [MAR Hold] aspirin  325 mg Oral Daily   [MAR Hold] clopidogrel  75 mg Oral Q2000   [MAR Hold] enoxaparin (LOVENOX) injection  40 mg Subcutaneous Daily   [MAR Hold] escitalopram  10 mg Oral QPC breakfast   [MAR Hold] irbesartan  300 mg Oral Daily   And   [MAR Hold] hydrochlorothiazide  25 mg Oral Daily   [MAR Hold] insulin aspart  0-5 Units Subcutaneous QHS   [MAR Hold] insulin aspart  0-9 Units Subcutaneous TID WC   [MAR Hold] metoprolol succinate  50 mg Oral QPC supper   [MAR Hold] omega-3 acid ethyl esters  1 g Oral BID   [MAR Hold] simvastatin  20 mg Oral q1800   Continuous Infusions:  [MAR Hold] sodium chloride 500 mL (02/12/19 1725)   [MAR Hold]  ceFAZolin (ANCEF) IV       LOS: 2 days       Alwyn RenElizabeth G Destanie Tibbetts, MD [facility]  If 7PM-7AM, please contact night-coverage [email or web address] Password [email or web address] 02/15/2019, 11:27 AM

## 2019-02-16 ENCOUNTER — Encounter (HOSPITAL_COMMUNITY): Payer: Self-pay | Admitting: Family Medicine

## 2019-02-16 DIAGNOSIS — I6522 Occlusion and stenosis of left carotid artery: Secondary | ICD-10-CM

## 2019-02-16 DIAGNOSIS — E119 Type 2 diabetes mellitus without complications: Secondary | ICD-10-CM

## 2019-02-16 DIAGNOSIS — I10 Essential (primary) hypertension: Secondary | ICD-10-CM

## 2019-02-16 DIAGNOSIS — G459 Transient cerebral ischemic attack, unspecified: Secondary | ICD-10-CM

## 2019-02-16 HISTORY — DX: Transient cerebral ischemic attack, unspecified: G45.9

## 2019-02-16 HISTORY — DX: Occlusion and stenosis of left carotid artery: I65.22

## 2019-02-16 HISTORY — DX: Type 2 diabetes mellitus without complications: E11.9

## 2019-02-16 LAB — COMPREHENSIVE METABOLIC PANEL
ALT: 49 U/L — ABNORMAL HIGH (ref 0–44)
AST: 26 U/L (ref 15–41)
Albumin: 3.3 g/dL — ABNORMAL LOW (ref 3.5–5.0)
Alkaline Phosphatase: 44 U/L (ref 38–126)
Anion gap: 13 (ref 5–15)
BUN: 16 mg/dL (ref 8–23)
CO2: 21 mmol/L — ABNORMAL LOW (ref 22–32)
Calcium: 8.6 mg/dL — ABNORMAL LOW (ref 8.9–10.3)
Chloride: 100 mmol/L (ref 98–111)
Creatinine, Ser: 0.65 mg/dL (ref 0.44–1.00)
GFR calc Af Amer: >60 mL/min (ref >60)
GFR calc non Af Amer: >60 mL/min (ref >60)
Glucose, Bld: 291 mg/dL — ABNORMAL HIGH (ref 70–99)
Potassium: 3.5 mmol/L (ref 3.5–5.1)
Sodium: 134 mmol/L — ABNORMAL LOW (ref 135–145)
Total Bilirubin: 0.5 mg/dL (ref 0.3–1.2)
Total Protein: 6.4 g/dL — ABNORMAL LOW (ref 6.5–8.1)

## 2019-02-16 LAB — CBC WITH DIFFERENTIAL/PLATELET
Abs Immature Granulocytes: 0.13 10*3/uL — ABNORMAL HIGH (ref 0.00–0.07)
Basophils Absolute: 0 10*3/uL (ref 0.0–0.1)
Basophils Relative: 0 %
Eosinophils Absolute: 0 10*3/uL (ref 0.0–0.5)
Eosinophils Relative: 0 %
HCT: 35.3 % — ABNORMAL LOW (ref 36.0–46.0)
Hemoglobin: 11.7 g/dL — ABNORMAL LOW (ref 12.0–15.0)
Immature Granulocytes: 1 %
Lymphocytes Relative: 6 %
Lymphs Abs: 1 10*3/uL (ref 0.7–4.0)
MCH: 27.7 pg (ref 26.0–34.0)
MCHC: 33.1 g/dL (ref 30.0–36.0)
MCV: 83.5 fL (ref 80.0–100.0)
Monocytes Absolute: 0.8 10*3/uL (ref 0.1–1.0)
Monocytes Relative: 4 %
Neutro Abs: 16.2 10*3/uL — ABNORMAL HIGH (ref 1.7–7.7)
Neutrophils Relative %: 89 %
Platelets: 261 10*3/uL (ref 150–400)
RBC: 4.23 MIL/uL (ref 3.87–5.11)
RDW: 13.2 % (ref 11.5–15.5)
WBC: 18.1 10*3/uL — ABNORMAL HIGH (ref 4.0–10.5)
nRBC: 0 % (ref 0.0–0.2)

## 2019-02-16 LAB — GLUCOSE, CAPILLARY
Glucose-Capillary: 245 mg/dL — ABNORMAL HIGH (ref 70–99)
Glucose-Capillary: 254 mg/dL — ABNORMAL HIGH (ref 70–99)
Glucose-Capillary: 168 mg/dL — ABNORMAL HIGH (ref 70–99)

## 2019-02-16 MED ORDER — HYDRALAZINE HCL 20 MG/ML IJ SOLN
10.0000 mg | Freq: Once | INTRAMUSCULAR | Status: AC
  Administered 2019-02-16: 10 mg via INTRAVENOUS
  Filled 2019-02-16: qty 1

## 2019-02-16 MED ORDER — ALUM & MAG HYDROXIDE-SIMETH 200-200-20 MG/5ML PO SUSP
30.0000 mL | Freq: Once | ORAL | Status: AC
  Administered 2019-02-16: 30 mL via ORAL
  Filled 2019-02-16: qty 30

## 2019-02-16 MED ORDER — BLOOD GLUCOSE MONITOR KIT: PACK | 0 refills | Status: DC

## 2019-02-16 MED ORDER — PROMETHAZINE HCL 25 MG/ML IJ SOLN
12.5000 mg | Freq: Once | INTRAMUSCULAR | Status: AC
  Administered 2019-02-16: 12.5 mg via INTRAVENOUS
  Filled 2019-02-16: qty 1

## 2019-02-16 MED ORDER — LIDOCAINE VISCOUS HCL 2 % MT SOLN
15.0000 mL | Freq: Once | OROMUCOSAL | Status: AC
  Administered 2019-02-16: 15 mL via ORAL
  Filled 2019-02-16: qty 15

## 2019-02-16 NOTE — Progress Notes (Signed)
Patient A line removed as ordered ad per protocol. Pressure held and dressing applied. Will monitor patient. Lillyanne Bradburn, Bettina Gavia rN

## 2019-02-16 NOTE — TOC Transition Note (Signed)
Transition of Care [facility]) - CM/SW Discharge Note   Patient Details  Name: Margaret Butler MRN: [id number] Date of Birth: 04-Sep-1940  [facility] CM/SW Contact:  Carles Collet, RN Phone Number: 02/16/2019, 11:33 AM   Clinical Narrative:    Damaris Schooner w patient at the bedside. She states she has a friend Trish whom she privided phone number for. Spoke w Trish over the phone in patient's room she states she works w [employer]. Referral made to [facility], Wannetta Sender is scheduled out until next week. Patient states that she wants to have Trish and is willing to wait to next week to start services. No DME needs, no other CM needs.     Final next level of care: [place] Barriers to Discharge: No Barriers Identified   Patient Goals and CMS Choice Patient states their goals for this hospitalization and ongoing recovery are:: to return home CMS Medicare.gov Compare Post Acute Care list provided to:: Patient Choice offered to / list presented to : Patient  Discharge Placement                       Discharge Plan and Services                          HH Arranged: PT, OT [facility] Agency: [place] Date [place]: 02/16/19 Time [place]: [id number] Representative spoke with at [place]: [place] Determinants of Health ([place]) Interventions     Readmission Risk Interventions No flowsheet data found.

## 2019-02-16 NOTE — Progress Notes (Addendum)
  Progress Note    02/16/2019 7:31 AM 1 Day Post-Op  Subjective:  Denies stroke like symptoms including slurring speech, changes in vision, or one sided weakness   Vitals:   02/16/19 0614 02/16/19 0649  BP:  (!) 123/47  Pulse: 100 (!) 103  Resp: 17 19  Temp:    SpO2: (!) 88% 92%   Physical Exam: Lungs:  Non labored Incisions:  L neck incision without hematoma, c/d/i Extremities:  Moving all extremities well Neurologic: CN grossly intact  CBC    Component Value Date/Time   WBC 18.1 (H) 02/16/2019 0331   RBC 4.23 02/16/2019 0331   HGB 11.7 (L) 02/16/2019 0331   HCT 35.3 (L) 02/16/2019 0331   PLT 261 02/16/2019 0331   MCV 83.5 02/16/2019 0331   MCH 27.7 02/16/2019 0331   MCHC 33.1 02/16/2019 0331   RDW 13.2 02/16/2019 0331   LYMPHSABS 1.0 02/16/2019 0331   MONOABS 0.8 02/16/2019 0331   EOSABS 0.0 02/16/2019 0331   BASOSABS 0.0 02/16/2019 0331    BMET    Component Value Date/Time   NA 134 (L) 02/16/2019 0331   K 3.5 02/16/2019 0331   CL 100 02/16/2019 0331   CO2 21 (L) 02/16/2019 0331   GLUCOSE 291 (H) 02/16/2019 0331   BUN 16 02/16/2019 0331   CREATININE 0.65 02/16/2019 0331   CALCIUM 8.6 (L) 02/16/2019 0331   GFRNONAA >60 02/16/2019 0331   GFRAA >60 02/16/2019 0331    INR    Component Value Date/Time   INR 0.9 02/12/2019 1623     Intake/Output Summary (Last 24 hours) at 02/16/2019 0731 Last data filed at 02/16/2019 0543 Gross per 24 hour  Intake 2727.82 ml  Output 1600 ml  Net 1127.82 ml     Assessment/Plan:  78 y.o. female is s/p L CEA for symptomatic stenosis 1 Day Post-Op   L neck incision unremarkable Neuro exam unchanged compared to pre-op [facility] for discharge when medically ready   Dagoberto Ligas, PA-C [facility] [phone] 02/16/2019 7:31 AM  I have independently interviewed and examined patient and agree with PA assessment and plan above.  Okay for discharge from vascular standpoint we will follow-up in a few weeks  in our office.  Larwence Tu C. Donzetta Matters, MD [facility] [place] Office: [phone] Pager: [phone]

## 2019-02-16 NOTE — Progress Notes (Signed)
We keep A- line after lab drawn in the morning for MD evaluation at am due to persistent high BP last night. Neuro signs remain normal and no acute distress noted at this time. Continue to monitor.  Kennyth Lose, RN

## 2019-02-16 NOTE — Discharge Summary (Signed)
Physician Discharge Summary  Margaret Butler DOB: 10-02-1940 DOA: 02/12/2019  PCP: System, Pcp Not In  Admit date: 02/12/2019 Discharge date: 02/16/2019  Recommendations for Outpatient Follow-up:  TIA in setting of symptomatic left ICA stenosis with stuttering symptoms consisting of expressive aphasia right sided extinction and ataxia.   --Follow-up with [facility] in 4 weeks, office will call with appointment date and time, order placed by neurology  Status post left carotid endarterectomy --Follow-up with vascular surgery as an outpatient  Elevated AST, ALT on admission.  AST has normalized on discharge.  ALT trending down.  On a statin.  Recommend follow-up as an outpatient.  Diabetes mellitus type 2, hemoglobin A1c 7.2, new diagnosis --Declined any pharmacotherapy, wants to discuss with her primary care physician as an outpatient --Provided with prescription for CBG meter on discharge  Follow-up Information    [facility] Follow up in 4 week(s).   Why: stroke/TIA clinic. office will call with appt date and time.  Contact information: [address]     [address] [place] [address] [phone]       Margaret Sandy, MD Follow up in 2 week(s).   Specialties: Vascular Surgery, Cardiology Why: sent Contact information: [place] [address] [phone]        Margaret Butler., MD. Schedule an appointment as soon as possible for a visit in 2 week(s).   Specialty: Internal Medicine Contact information: [address] [place] [address] [phone]            Discharge Diagnoses: Principal diagnosis is #1 1. TIA secondary to symptomatic left ICA stenosis 2. Status post left carotid endarterectomy 3. Essential hypertension with hypertensive urgency on admission 4. New diagnosis diabetes mellitus type 2 5. Elevated AST, ALT on admission  Discharge  Condition: improved Disposition: home with HH PT, OT  Diet recommendation: heart healthy, diabetic diet  Filed Weights   02/12/19 1657 02/15/19 1823  Weight: 68.4 kg 74.1 kg    History of present illness: 78 year old woman PMH including essential hypertension, hyperlipidemia, coronary stents x2 but no history of TIA or stroke who presented 7/4 with expressive aphasia, seen by tele-neurology in the emergency department but deemed not a candidate for TPA given mild symptoms and NIHSS of zero.    Hospital Course:  CT head was negative for acute infarct, hemorrhage but she became hypertensive and was transferred to [facility] for neurology evaluation.  Seen by neurology, carotid ultrasound showed 60-79% left ICA stenosis which seemed to correspond to CT angiogram.  Vascular surgery consulted and recommended left carotid endarterectomy to reduce risk of further TIA or stroke.  She underwent left internal carotid endarterectomy 7/7 without obvious complication.  She was seen by vascular surgery the following day and cleared for discharge.  Individual issues as below.  TIA in setting of symptomatic left ICA stenosis with stuttering symptoms consisting of expressive aphasia right sided extinction and ataxia.  Had right upper extremity ataxia and extinction on admission --Not an endovascular candidate secondary to resolved symptoms and mild deficits, as well as no large vessel occlusion --CTA head negative for large vessel occlusion, percent chronic diffuse atherosclerotic disease of large and small vessels, severe intracranial stenosis --MRI brain no acute finding --Carotid ultrasound right ICA 40-59%, left ICA 60-79%, vertebral arteries antegrade --2D echocardiogram LVEF 60-65%, no source of embolus --Permissive hypertension, treat for SBP greater than 220/120 in the short-term, but allowed to normalize over 5-7 days,  and long-term needs tight blood pressure control given 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 --On Plavix prior to admission, now on aspirin 325 mg daily and Plavix for 3 weeks followed by Plavix alone given history of cardiac stents. --SLP evaluated and recommended no further follow-up. Linguistically or for diet.  Regular diet, thin liquids recommended. --Follow-up with [facility] in 4 weeks, office will call with appointment date and time, order placed by neurology --Asymptomatic.  Stable for discharge.  Essential hypertension with hypertensive urgency on admission systolic blood pressure 378, treated initially with nicardipine infusion. --Night nurse noted elevated systolic blood pressures, however these are not documented in the vital signs section, patient given multiple doses of hydralazine, last 0416 --Review of vital signs shows systolic blood pressure 588, 135, 139, 165, 137, 119, 123.  Blood pressure this morning 123/47.  Arterial line pressures overnight: Systolic 502, 774, 128, 786, 174, 183, 168, 165, 150, 142, 131, 120, 148, 144 Patient had vomiting overnight treated with Phenergan --Normotensive today, asymptomatic, arterial line appears to be fairly close to noninvasive measurement. --Pressure appears stable for discharge.  Vomiting resolved.  Eating breakfast this morning.  Elevated AST, ALT on admission.  AST has normalized on discharge.  ALT trending down.  On a statin.  Recommend follow-up as an outpatient.  Leukocytosis, likely related to demargination from stress of surgery.  No evidence of infection.  No further evaluation suggested.  Hyperlipidemia --Continue statin  Diabetes mellitus type 2, hemoglobin A1c 7.2, new diagnosis --Stable.  May be able to manage with diet.   --Declined any pharmacotherapy, wants to discuss with her primary care physician as an outpatient --Will provide with prescription for CBG meter on discharge  Consultants   Neurology  Vascular surgery  Procedures   7/7 left internal carotid  artery endarterectomy  Echo IMPRESSIONS   1. The left ventricle has normal systolic function with an ejection fraction of 60-65%. The cavity size was normal. There is moderately increased left ventricular wall thickness. Left ventricular diastolic Doppler parameters are consistent with impaired relaxation. Elevated mean left atrial pressure. 2. The right ventricle has normal systolic function. The cavity was normal. 3. The mitral valve is grossly normal. There is moderate mitral annular calcification present. 4. Trivial pericardial effusion is present. 5. The aortic valve is tricuspid. Mild calcification of the aortic valve. Aortic valve regurgitation is trivial by color flow Doppler. No stenosis of the aortic valve. 6. Normal LV systolic function; moderate LVH; mild diastolic dysfunction; sclerotic aortic valve with trace AI; negative saline microcavitation study.  Today's assessment: S: Did have some vomiting last night.  Feels well today, currently eating English muffins.  No nausea, no pain.  No new neurologic symptoms.  No numbness or tingling.  No difficulty speaking or swallowing. O: Vitals:  Vitals:   02/16/19 0614 02/16/19 0649  BP:  (!) 123/47  Pulse: 100 (!) 103  Resp: 17 19  Temp:    SpO2: (!) 88% 92%    Constitutional:   Appears calm and comfortable, eating breakfast Eyes:   pupils and irises appear normal ENMT:   grossly normal hearing  Respiratory:   CTA bilaterally, no w/r/r.   Respiratory effort normal. Cardiovascular:   RRR, no m/r/g Psychiatric:   judgement and insight appear normal  Mental status o Mood, affect appropriate  Today's Data   BMP unremarkable other than glucose 291.  AST normal at 26.  ALT trending down, 49.  Total bilirubin and alkaline phosphatase within normal  limits.  WBC 18.1, hemoglobin 11.7.  Lab Data     Micro Data   SARS-CoV-2 negative  Imaging   MRI brain no acute findings  CT angiography head  and neck, severe intracranial stenoses noted bilateral carotid bifurcation atherosclerosis  CT head was negative for acute abnormalities  Cardiology Data   Carotid ultrasound showed right ICA 40-59% stenosis, left ICA consistent with 60-79% stenosis  EKG showed sinus rhythm with LVH with repolarization abnormality.  No previous EKGs available for comparison.   Discharge Instructions  Discharge Instructions    Ambulatory referral to Neurology   Complete by: As directed    Follow up with stroke clinic NP (Jessica Vanschaick or Cecille Rubin, if both not available, consider Dr. Antony Contras, Dr. Bess Harvest, or Dr. Sarina Ill) at [facility] in about 4 weeks.     Allergies as of 02/16/2019      Reactions   Atorvastatin Other (See Comments)   Muscle pain   Lisinopril Cough      Sertraline Other (See Comments)   Caused headaches   Simvastatin Other (See Comments)   Muscle pain      Medication List    STOP taking these medications   aspirin EC 325 MG tablet Replaced by: aspirin 325 MG tablet   aspirin EC 81 MG tablet     TAKE these medications   aspirin 325 MG tablet Take 1 tablet (325 mg total) by mouth daily. Replaces: aspirin EC 325 MG tablet   blood glucose meter kit and supplies Kit Dispense based on patient and insurance preference. Use up to four times daily as directed. (FOR ICD-9 250.00, 250.01).   Calcium 600+D 600-400 MG-UNIT tablet Generic drug: Calcium Carbonate-Vitamin D Take 1 tablet by mouth daily with breakfast.   clopidogrel 75 MG tablet Commonly known as: PLAVIX Take 75 mg by mouth daily at 8 pm.   Coricidin HBP Congestion/Cough 10-200 MG Caps Generic drug: Dextromethorphan-guaiFENesin Take 1 capsule by mouth every 8 (eight) hours as needed (for congestion).   escitalopram 10 MG tablet Commonly known as: LEXAPRO Take 10 mg by mouth daily after breakfast.   Fish Oil 1000 MG Caps Take 1,000 mg by mouth daily with  breakfast.   Magnesium 250 MG Tabs Take 500 mg by mouth daily after supper.   metoprolol succinate 25 MG 24 hr tablet Commonly known as: TOPROL-XL Take 50 mg by mouth daily after supper.   Potassium Gluconate 595 MG Tbcr Take 595 mg by mouth daily with breakfast.   simvastatin 20 MG tablet Commonly known as: ZOCOR Take 20 mg by mouth at bedtime.   telmisartan-hydrochlorothiazide 80-25 MG tablet Commonly known as: MICARDIS HCT Take 1 tablet by mouth every morning.   TURMERIC PO Take 1,500-3,000 mg by mouth daily with breakfast.      Allergies  Allergen Reactions   Atorvastatin Other (See Comments)    Muscle pain   Lisinopril Cough        Sertraline Other (See Comments)    Caused headaches    Simvastatin Other (See Comments)    Muscle pain    The results of significant diagnostics from this hospitalization (including imaging, microbiology, ancillary and laboratory) are listed below for reference.    Significant Diagnostic Studies: Ct Angio Head W Or Wo Contrast  Result Date: 02/12/2019 CLINICAL DATA:  78 year old female with abnormal speech, expressive aphasia. EXAM: CT ANGIOGRAPHY HEAD AND NECK TECHNIQUE: Multidetector CT imaging of the head and neck was performed using the standard protocol  during bolus administration of intravenous contrast. Multiplanar CT image reconstructions and MIPs were obtained to evaluate the vascular anatomy. Carotid stenosis measurements (when applicable) are obtained utilizing NASCET criteria, using the distal internal carotid diameter as the denominator. CONTRAST:  165m OMNIPAQUE IOHEXOL 350 MG/ML SOLN COMPARISON:  Head CT without contrast 1618 hours today. FINDINGS: CTA NECK Skeleton: No acute osseous abnormality identified. Upper chest: No superior mediastinal lymphadenopathy. Normal enhancement of the visible central pulmonary arteries. Probable peribronchial atelectasis in the visible lungs. Other neck: Negative. Aortic arch: Calcified  aortic atherosclerosis. Three vessel arch configuration. Right carotid system: Minimal calcified plaque at the right CCA origin without stenosis. Complex soft and calcified plaque at the right carotid bifurcation and affecting the right ICA origin with 50-60 % stenosis with respect to the distal vessel. Distal to the bulb the vessel is mildly tortuous but otherwise normal to the skull base. Left carotid system: Normal left CCA origin. Bulky calcified and lesser soft plaque at the left carotid bifurcation continuing into the left ICA origin and bulb resulting in stenosis up to 60 % with respect to the distal vessel (series 9, image 149). Distal to the bulb the vessel is normal to the skull base. Vertebral arteries: Calcified plaque in the proximal right subclavian artery without significant stenosis and spares the right vertebral artery origin. Patent right vertebral artery to the skull base with only mild V3 calcified plaque. No proximal left subclavian artery stenosis despite plaque. Normal left vertebral artery origin. Tortuous left V1 segment. The left vertebral is mildly dominant and patent to the skull base. There is moderate calcified V3 segment plaque, but no significant stenosis. CTA HEAD Posterior circulation: There is severe bilateral V4 segment vertebral artery atherosclerosis and stenosis. The distal left V4 segment appears near occluded proximal to the vertebrobasilar junction (series 8, image 115). There is moderate to severe stenosis of the mid right V4 segment proximal to the right PICA. Both PICA remain patent. There is near occlusion of the proximal 3rd of the basilar artery on series 7, image 126 with reconstitution of the vessel distally. There is additional mild to moderate stenosis just proximal to the basilar tip (series 9, image 106. Despite this the bilateral SCA and PCA origins are patent. There is a fetal type left PCA origin. The right PCOM is diminutive or absent. Left PCA branches are  patent with mild to moderate P2 segment irregularity and stenosis. Right PCA branches are patent but more diminutive with mild generalized irregularity. Anterior circulation: Both ICA siphons are patent and heavily calcified. On the left there is mild to moderate supraclinoid stenosis. On the right there is moderate to severe supraclinoid stenosis (series 7, image 119). The left posterior communicating arteryorigin is normal. The carotid termini are patent. The MCA and ACA origins are patent. The left M1 appears dominant. The anterior communicating artery and bilateral ACA branches are patent. There is mild distal left A2 stenosis on series 12, image 23. The left MCA M1 segment and bifurcation are patent without stenosis. There is mild to moderate left MCA M2 branch irregularity with mild stenosis on series 10, image 24. No left MCA branch occlusion identified. There is severe stenosis of the right MCA mid M1 on series 11, image 20. The right MCA bifurcation than is patent, but there is a severe stenosis of the dominant posterior right MCA M2 branch on series 12, image 15. No right MCA branch occlusion is identified. Venous sinuses: Early contrast timing but the major dural venous  sinuses appear grossly patent. Anatomic variants: Fetal type left PCA origin. Review of the MIP images confirms the above findings IMPRESSION: 1. There are several age indeterminate Severe intracranial stenoses: - Basilar Artery proximal 3rd (near occlusion). - Right MCA M1. - Right MCA posterior M2. This was discussed by telephone with Dr. Kerney Elbe on 02/12/2019 at 1825 hours, and I felt like MRI DWI imaging would be most helpful at this point. 2. Otherwise negative for large vessel occlusion, but there are additional moderate to severe arterial stenoses: - Right ICA supraclinoid segment. - bilateral vertebral artery V4 segments. 3. Bilateral carotid bifurcation atherosclerosis resulting in 50-60% stenosis of both ICA origins.  Electronically Signed   By: Genevie Ann M.D.   On: 02/12/2019 18:42   Ct Angio Neck W Or Wo Contrast  Result Date: 02/12/2019 CLINICAL DATA:  78 year old female with abnormal speech, expressive aphasia. EXAM: CT ANGIOGRAPHY HEAD AND NECK TECHNIQUE: Multidetector CT imaging of the head and neck was performed using the standard protocol during bolus administration of intravenous contrast. Multiplanar CT image reconstructions and MIPs were obtained to evaluate the vascular anatomy. Carotid stenosis measurements (when applicable) are obtained utilizing NASCET criteria, using the distal internal carotid diameter as the denominator. CONTRAST:  172m OMNIPAQUE IOHEXOL 350 MG/ML SOLN COMPARISON:  Head CT without contrast 1618 hours today. FINDINGS: CTA NECK Skeleton: No acute osseous abnormality identified. Upper chest: No superior mediastinal lymphadenopathy. Normal enhancement of the visible central pulmonary arteries. Probable peribronchial atelectasis in the visible lungs. Other neck: Negative. Aortic arch: Calcified aortic atherosclerosis. Three vessel arch configuration. Right carotid system: Minimal calcified plaque at the right CCA origin without stenosis. Complex soft and calcified plaque at the right carotid bifurcation and affecting the right ICA origin with 50-60 % stenosis with respect to the distal vessel. Distal to the bulb the vessel is mildly tortuous but otherwise normal to the skull base. Left carotid system: Normal left CCA origin. Bulky calcified and lesser soft plaque at the left carotid bifurcation continuing into the left ICA origin and bulb resulting in stenosis up to 60 % with respect to the distal vessel (series 9, image 149). Distal to the bulb the vessel is normal to the skull base. Vertebral arteries: Calcified plaque in the proximal right subclavian artery without significant stenosis and spares the right vertebral artery origin. Patent right vertebral artery to the skull base with only mild V3  calcified plaque. No proximal left subclavian artery stenosis despite plaque. Normal left vertebral artery origin. Tortuous left V1 segment. The left vertebral is mildly dominant and patent to the skull base. There is moderate calcified V3 segment plaque, but no significant stenosis. CTA HEAD Posterior circulation: There is severe bilateral V4 segment vertebral artery atherosclerosis and stenosis. The distal left V4 segment appears near occluded proximal to the vertebrobasilar junction (series 8, image 115). There is moderate to severe stenosis of the mid right V4 segment proximal to the right PICA. Both PICA remain patent. There is near occlusion of the proximal 3rd of the basilar artery on series 7, image 126 with reconstitution of the vessel distally. There is additional mild to moderate stenosis just proximal to the basilar tip (series 9, image 106. Despite this the bilateral SCA and PCA origins are patent. There is a fetal type left PCA origin. The right PCOM is diminutive or absent. Left PCA branches are patent with mild to moderate P2 segment irregularity and stenosis. Right PCA branches are patent but more diminutive with mild generalized irregularity. Anterior  circulation: Both ICA siphons are patent and heavily calcified. On the left there is mild to moderate supraclinoid stenosis. On the right there is moderate to severe supraclinoid stenosis (series 7, image 119). The left posterior communicating arteryorigin is normal. The carotid termini are patent. The MCA and ACA origins are patent. The left M1 appears dominant. The anterior communicating artery and bilateral ACA branches are patent. There is mild distal left A2 stenosis on series 12, image 23. The left MCA M1 segment and bifurcation are patent without stenosis. There is mild to moderate left MCA M2 branch irregularity with mild stenosis on series 10, image 24. No left MCA branch occlusion identified. There is severe stenosis of the right MCA mid M1  on series 11, image 20. The right MCA bifurcation than is patent, but there is a severe stenosis of the dominant posterior right MCA M2 branch on series 12, image 15. No right MCA branch occlusion is identified. Venous sinuses: Early contrast timing but the major dural venous sinuses appear grossly patent. Anatomic variants: Fetal type left PCA origin. Review of the MIP images confirms the above findings IMPRESSION: 1. There are several age indeterminate Severe intracranial stenoses: - Basilar Artery proximal 3rd (near occlusion). - Right MCA M1. - Right MCA posterior M2. This was discussed by telephone with Dr. Kerney Elbe on 02/12/2019 at 1825 hours, and I felt like MRI DWI imaging would be most helpful at this point. 2. Otherwise negative for large vessel occlusion, but there are additional moderate to severe arterial stenoses: - Right ICA supraclinoid segment. - bilateral vertebral artery V4 segments. 3. Bilateral carotid bifurcation atherosclerosis resulting in 50-60% stenosis of both ICA origins. Electronically Signed   By: Genevie Ann M.D.   On: 02/12/2019 18:42   Mr Brain Wo Contrast (neuro Protocol)  Result Date: 02/13/2019 CLINICAL DATA:  Acute presentation yesterday with intermittent slurred speech. EXAM: MRI HEAD WITHOUT CONTRAST TECHNIQUE: Multiplanar, multiecho pulse sequences of the brain and surrounding structures were obtained without intravenous contrast. COMPARISON:  CT studies 02/12/2019 FINDINGS: Brain: Diffusion imaging does not show any acute or subacute infarction. Brainstem and cerebellum are normal. Cerebral hemispheres show moderate changes of chronic small vessel disease affecting the deep and subcortical white matter. No large vessel territory infarction. No mass lesion, hemorrhage, hydrocephalus or extra-axial collection. Few small foci of hemosiderin deposition associated with some of the old small vessel infarctions. Vascular: Major vessels at the base of the brain show flow. Skull and  upper cervical spine: Negative Sinuses/Orbits: Clear/normal Other: None IMPRESSION: No acute finding by MRI. Moderate chronic appearing small vessel ischemic changes of the cerebral hemispheric white matter Electronically Signed   By: Nelson Chimes M.D.   On: 02/13/2019 08:32   Ct Head Code Stroke Wo Contrast  Result Date: 02/12/2019 CLINICAL DATA:  Code stroke. 78 year old female with intermittent slurred speech since 1300 hours. EXAM: CT HEAD WITHOUT CONTRAST TECHNIQUE: Contiguous axial images were obtained from the base of the skull through the vertex without intravenous contrast. COMPARISON:  None. FINDINGS: Brain: Patchy and confluent bilateral cerebral white matter hypodensity. No acute intracranial hemorrhage identified. No midline shift, mass effect, or evidence of intracranial mass lesion. No ventriculomegaly. Mild heterogeneity of the right basal ganglia. No definite cortical encephalomalacia. No cortically based acute infarct identified. Vascular: Extensive Calcified atherosclerosis at the skull base. No suspicious intracranial vascular hyperdensity. Skull: No acute osseous abnormality identified. Sinuses/Orbits: Paranasal Visualized paranasal sinuses and mastoids well pneumatized. Other: No acute orbit or scalp soft tissue findings.  ASPECTS [facility] Stroke Program Early CT Score) - Ganglionic level infarction (caudate, lentiform nuclei, internal capsule, insula, M1-M3 cortex): 7 - Supraganglionic infarction (M4-M6 cortex): 3 Total score (0-10 with 10 being normal): 10 IMPRESSION: 1. No acute cortically based infarct or acute intracranial hemorrhage identified. ASPECTS 10. 2. Moderately advanced chronic cerebral white matter disease, most commonly due to chronic small vessel ischemia. Study discussed by telephone with Dr. Apolonio Schneiders LITTLE on 02/12/2019 at 16:33 . Electronically Signed   By: Genevie Ann M.D.   On: 02/12/2019 16:33   Vas US Carotid  Result Date: 02/14/2019 Carotid Arterial Duplex Study  Indications:  Speech disturbance, Weakness and 50-60% ICA stenosis by CTA. Risk Factors: Hypertension, hyperlipidemia. Performing Technologist: Sharion Dove RVS  Examination Guidelines: A complete evaluation includes B-mode imaging, spectral Doppler, color Doppler, and power Doppler as needed of all accessible portions of each vessel. Bilateral testing is considered an integral part of a complete examination. Limited examinations for reoccurring indications may be performed as noted.  Right Carotid Findings: +----------+--------+--------+--------+--------+------------------+             PSV cm/s EDV cm/s Stenosis Describe Comments            +----------+--------+--------+--------+--------+------------------+  CCA Prox   103      16                         intimal thickening  +----------+--------+--------+--------+--------+------------------+  CCA Distal 76       16                         intimal thickening  +----------+--------+--------+--------+--------+------------------+  ICA Prox   203      47                calcific Shadowing           +----------+--------+--------+--------+--------+------------------+  ICA Mid    152      37                                             +----------+--------+--------+--------+--------+------------------+  ICA Distal 133      26                                             +----------+--------+--------+--------+--------+------------------+  ECA        203      9                                              +----------+--------+--------+--------+--------+------------------+ +----------+--------+-------+--------+-------------------+             PSV cm/s EDV cms Describe Arm Pressure (mmHG)  +----------+--------+-------+--------+-------------------+  Subclavian 59                                             +----------+--------+-------+--------+-------------------+ +---------+--------+--+--------+--+  Vertebral PSV cm/s 50 EDV cm/s 15  +---------+--------+--+--------+--+  Left  Carotid Findings: +----------+--------+--------+--------+--------+------------------+  PSV cm/s EDV cm/s Stenosis Describe Comments            +----------+--------+--------+--------+--------+------------------+  CCA Prox   57       13                         intimal thickening  +----------+--------+--------+--------+--------+------------------+  CCA Distal 56       12                         intimal thickening  +----------+--------+--------+--------+--------+------------------+  ICA Prox   273      72                calcific Shadowing           +----------+--------+--------+--------+--------+------------------+  ICA Mid    212      33                                             +----------+--------+--------+--------+--------+------------------+  ICA Distal 128      33                                             +----------+--------+--------+--------+--------+------------------+  ECA        24       6                                              +----------+--------+--------+--------+--------+------------------+ +----------+--------+--------+--------+-------------------+  Subclavian PSV cm/s EDV cm/s Describe Arm Pressure (mmHG)  +----------+--------+--------+--------+-------------------+             152                                             +----------+--------+--------+--------+-------------------+ +---------+--------+--+--------+-+  Vertebral PSV cm/s 60 EDV cm/s 9  +---------+--------+--+--------+-+  Summary: Right Carotid: Velocities in the right ICA are consistent with a 40-59%                stenosis. Left Carotid: Velocities in the left ICA are consistent with a 60-79% stenosis. Vertebrals:  Bilateral vertebral arteries demonstrate antegrade flow. Subclavians: Normal flow hemodynamics were seen in bilateral subclavian              arteries. *See table(s) above for measurements and observations.  Electronically signed by Antony Contras MD on 02/14/2019 at 2:31:32 PM.    Final      Microbiology: Recent Results (from the past 240 hour(s))  SARS Coronavirus 2 (Hosp order,Performed in [facility] lab via [employer] ID)     Status: None   Collection Time: 02/12/19  4:33 PM   Specimen: Dry Nasal Swab (Abbott ID Now)  Result Value Ref Range Status   SARS Coronavirus 2 (Abbott ID Now) NEGATIVE NEGATIVE Final    Comment: (NOTE) SARS-CoV-2 target nucleic acids are NOT DETECTED. The SARS-CoV-2 RNA is generally detectable in upper and lower respiratory specimens during the acute phase of infection.  Negativeresults do not preclude SARS-CoV-2 infection, do not rule  out coinfections with other pathogens, and should not be used as the  sole basis for treatment or other patient management decisions.  Negative results must be combined with clinical observations, patient history, and epidemiological information. The expected result is Negative. Fact Sheet for Patients: GolfingFamily.no Fact Sheet for Healthcare Providers: https://www.hernandez-brewer.com/ This test is not yet approved or cleared by the [place] FDA and  has been authorized for detection and/or diagnosis of SARS-CoV-2 by FDA under an Emergency Use Authorization (EUA).  This EUA will remain in effect (meaning this test can be used) for the duration of  the COVID19 declaration under Section 5 64(b)(1) of the Act, 21 U.S.C.  section 7140774645 3(b)(1), unless the authorization is terminated or revoked sooner. Performed at [facility], [address] [place], [place] [address]   MRSA PCR Screening     Status: None   Collection Time: 02/14/19 12:00 PM   Specimen: Nasal Mucosa; Nasopharyngeal  Result Value Ref Range Status   MRSA by PCR NEGATIVE NEGATIVE Final    Comment:        The GeneXpert MRSA Assay (FDA approved for NASAL specimens only), is one component of a comprehensive MRSA colonization surveillance program. It is not intended to diagnose  MRSA infection nor to guide or monitor treatment for MRSA infections. Performed at [facility], [place] [address], [place], [place] [address]      Labs: Basic Metabolic Panel: Recent Labs  Lab 02/12/19 1623 02/13/19 1045 02/16/19 0331  NA 138 139 134*  K 3.4* 4.8 3.5  CL 100 99 100  CO2 26 27 21*  GLUCOSE 176* 240* 291*  BUN 16 13 16   CREATININE 0.41* 0.56 0.65  CALCIUM 9.1 9.5 8.6*   Liver Function Tests: Recent Labs  Lab 02/12/19 1623 02/13/19 1045 02/16/19 0331  AST 42* 46* 26  ALT 68* 63* 49*  ALKPHOS 55 44 44  BILITOT 0.4 0.9 0.5  PROT 6.8 6.7 6.4*  ALBUMIN 3.8 3.6 3.3*   CBC: Recent Labs  Lab 02/12/19 1623 02/16/19 0331  WBC 10.5 18.1*  NEUTROABS 6.8 16.2*  HGB 13.0 11.7*  HCT 40.2 35.3*  MCV 84.6 83.5  PLT 248 261   CBG: Recent Labs  Lab 02/15/19 0607 02/15/19 0835 02/15/19 2120 02/16/19 0200 02/16/19 0615  GLUCAP 152* 141* 272* 245* 254*    Principal Problem:   TIA (transient ischemic attack) Active Problems:   Expressive aphasia   Hypertension   Hypertensive urgency   Elevated transaminase level   Hyperlipidemia   Left carotid stenosis   Benign essential HTN   DM type 2 (diabetes mellitus, type 2) ([place])   Time coordinating discharge: 40 minutes  Signed:  Murray Hodgkins, MD  [facility]  02/16/2019, 9:50 AM

## 2019-02-16 NOTE — Progress Notes (Signed)
Patient given discharge instructions, medication list and follow up appointments. Patient verbalized understanding. Patient has eaten breakfast and lunch and is without nausea or vomiting at this time. BP 165/61 heart rate 90 on monitor. IV and tele were dcd. Will discharge home as ordered. All questions answered. Akiba Melfi, Bettina Gavia rN

## 2019-02-16 NOTE — Progress Notes (Signed)
Night shift had evidences of high blood pressure, from A- line BP 166/50 - 218/ 70 mmHg after zeroed and leveled A- line was almost always > 30 mmHg than BP cuff. Notified Dr. Hilbert Bible, NP on-call provider, order received. Multiple doses of Hydralazine given.  Pt complained feeling back pain, gastric discomfort, epigastric pain, indigestion, bloating, nausea and vomiting with indigested food emesis multiple times.Pt did not complain of surgical pain except back pain scale 4-5/10. Tolerated fair. Tylenol and morphine given. Zofran IV given, but did not effective. Notified Dr. Carrie Mew. Order received for Phenergan IV. Symptoms relieved.  Sinus rhythm and sinus tachycardia on monitor, HR 80s-110s. SPO2 95-100% with room air, remained afebrile. Continue to monitor.  Kennyth Lose, RN

## 2019-02-16 NOTE — Progress Notes (Signed)
STROKE TEAM PROGRESS NOTE   SUBJECTIVE (INTERVAL HISTORY) Patient has no neurological complaints.  She had uneventful left carotid endarterectomy and is looking great today.  She has no complaints.  She is ready to go home. OBJECTIVE Vitals:   02/16/19 0649 02/16/19 0850 02/16/19 1140 02/16/19 1153  BP: (!) 123/47 (!) 131/51 (!) 165/61   Pulse: (!) 103 (!) 108 90 90  Resp: 19 18 16    Temp:      TempSrc:      SpO2: 92% 100% 100%   Weight:      Height:        CBC:  Recent Labs  Lab 02/12/19 1623 02/16/19 0331  WBC 10.5 18.1*  NEUTROABS 6.8 16.2*  HGB 13.0 11.7*  HCT 40.2 35.3*  MCV 84.6 83.5  PLT 248 638    Basic Metabolic Panel:  Recent Labs  Lab 02/13/19 1045 02/16/19 0331  NA 139 134*  K 4.8 3.5  CL 99 100  CO2 27 21*  GLUCOSE 240* 291*  BUN 13 16  CREATININE 0.56 0.65  CALCIUM 9.5 8.6*    Lipid Panel:     Component Value Date/Time   CHOL 226 (H) 02/13/2019 1045   TRIG 209 (H) 02/13/2019 1045   HDL 62 02/13/2019 1045   CHOLHDL 3.6 02/13/2019 1045   VLDL 42 (H) 02/13/2019 1045   LDLCALC 122 (H) 02/13/2019 1045   HgbA1c:  Lab Results  Component Value Date   HGBA1C 7.2 (H) 02/13/2019   Urine Drug Screen:     Component Value Date/Time   LABOPIA NONE DETECTED 02/12/2019 1635   COCAINSCRNUR NONE DETECTED 02/12/2019 1635   LABBENZ NONE DETECTED 02/12/2019 1635   AMPHETMU NONE DETECTED 02/12/2019 1635   THCU NONE DETECTED 02/12/2019 1635   LABBARB NONE DETECTED 02/12/2019 1635    Alcohol Level     Component Value Date/Time   ETH <10 02/12/2019 1623    IMAGING  Ct Head Code Stroke Wo Contrast 02/12/2019 IMPRESSION:  1. No acute cortically based infarct or acute intracranial hemorrhage identified. ASPECTS 10.  2. Moderately advanced chronic cerebral white matter disease, most commonly due to chronic small vessel ischemia.   Ct Angio Head W Or Wo Contrast Ct Angio Neck W Or Wo Contrast 02/12/2019 IMPRESSION:  1. There are several age  indeterminate Severe intracranial stenoses: - Basilar Artery proximal 3rd (near occlusion). - Right MCA M1. - Right MCA posterior M2.  2. Otherwise negative for large vessel occlusion, but there are additional moderate to severe arterial stenoses: - Right ICA supraclinoid segment. - bilateral vertebral artery V4 segments.  3. Bilateral carotid bifurcation atherosclerosis resulting in 50-60% stenosis of both ICA origins.   MR Brain WO Contrast 02/13/2019 IMPRESSION: No acute finding by MRI. Moderate chronic appearing small vessel ischemic changes of the cerebral hemispheric white matter  Transthoracic Echocardiogram w/ bubble  02/13/2019 1. The left ventricle has normal systolic function with an ejection fraction of 60-65%. The cavity size was normal. There is moderately increased left ventricular wall thickness. Left ventricular diastolic Doppler parameters are consistent with impaired relaxation. Elevated mean left atrial pressure.  2. The right ventricle has normal systolic function. The cavity was normal.  3. The mitral valve is grossly normal. There is moderate mitral annular calcification present.  4. Trivial pericardial effusion is present.  5. The aortic valve is tricuspid. Mild calcification of the aortic valve. Aortic valve regurgitation is trivial by color flow Doppler. No stenosis of the aortic valve.  6. Normal LV  systolic function; moderate LVH; mild diastolic dysfunction; sclerotic aortic valve with trace AI; negative saline microcavitation study.  Bilateral Carotid Dopplers  02/13/2019 Right Carotid: Velocities in the right ICA are consistent with a 40-59% stenosis. Left Carotid: Velocities in the left ICA are consistent with a 60-79% stenosis. Vertebrals:  Bilateral vertebral arteries demonstrate antegrade flow. Subclavians: Normal flow hemodynamics were seen in bilateral subclavian arteries.  EKG - SR rate 76 BPM. (See cardiology reading for complete details)   PHYSICAL EXAM    Blood pressure (!) 165/61, pulse 90, temperature 98.7 F (37.1 C), temperature source Oral, resp. rate 16, height 5\' 3"  (1.6 m), weight 74.1 kg, SpO2 100 %. Pleasant elderly Caucasian lady not in distress. . Afebrile. Head is nontraumatic. Neck is supple without bruit.    Cardiac exam no murmur or gallop. Lungs are clear to auscultation. Distal pulses are well felt.  Left carotid endarterectomy surgical scar looks healthy. Neurological Exam ;  Awake  Alert oriented x 3. Normal speech and language.eye movements full without nystagmus.fundi were not visualized. Vision acuity and fields appear normal. Hearing is normal. Palatal movements are normal. Face symmetric. Tongue midline. Normal strength, tone, reflexes and coordination. Normal sensation. Gait deferred.   ASSESSMENT/PLAN [name] is a 78 y.o. female with history of hypertension, hyperlipidemia, CAD (placement of 2 coronary artery stents) presenting with transient aphasia. She did not receive IV t-PA due to resolution of deficits.  L brain TIA in setting of L ICA stenosis  CT head -  no acute cortically based infarct or acute intracranial hemorrhage - moderately advanced chronic cerebral white matter disease,  MRI head - no acute finding  MRA head - not performed  CTA H&N - moderate to severe intracranial atherosclerosis as detailed above.  Carotid Doppler - R ICA 40-59%, L ICA 60-79%, VA antegrade  2D Echo - EF 60-65%. No source of embolus   Ball CorporationSars Corona Virus 2 - negative  LDL - 122  HgbA1c - 7.2  UDS  - negative  VTE prophylaxis - Lovenox  Diet  - Heart healthy with thin liquids.  clopidogrel 75 mg daily prior to admission, now on aspirin 325 mg daily and clopidogrel 75 mg daily (ASA 325 mg daily or p.r.n. prior to admission - chart inconsistant)  Patient counseled to be compliant with her antithrombotic medications  Ongoing aggressive stroke risk factor management  Therapy recommendations:  HH PT and  OT  Disposition:  Return home [facility] weeks. Office will call with appointment date and time. Order placed.  Carotid Stenosis  CTA 50-60% stenosis of both ICA origins   Carotid Doppler - R ICA 40-59%, L ICA 6-79%, VA antegrade  Given symptomatic L ICA stenosis - needs L CEA - pt wants medical therapy, to discuss with friend who is a vascular surgeon in [place]  Hypertensive Urgency, Hypertension  SBP  238 on arrival  Stable now . Permissive hypertension (OK if < 220/120) but gradually normalize in 5-7 days . 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  Follow-up as an outpatient in the stroke clinic in 6 weeks.  Continue aspirin and aggressive risk factor modification. Delia HeadyPramod Mussa Groesbeck, MD Medical Director [facility] Pager: [phone] 02/16/2019 1:49 PM   To contact Stroke Continuity provider, please refer to [email or web address]. After hours, contact General Neurology

## 2019-02-16 NOTE — Care Management Important Message (Signed)
Important Message  Patient Details  Name: Margaret Butler MRN: [id number] Date of Birth: [birth year]   Medicare Important Message Given:  Yes     Shelda Altes 02/16/2019, 11:56 AM

## 2019-02-17 ENCOUNTER — Encounter (HOSPITAL_COMMUNITY): Payer: Self-pay | Admitting: Vascular Surgery

## 2019-02-17 NOTE — OR Nursing (Signed)
Late entry entered snow hemostatic agent into supplies.

## 2019-03-04 ENCOUNTER — Ambulatory Visit (INDEPENDENT_AMBULATORY_CARE_PROVIDER_SITE_OTHER): Payer: Self-pay | Admitting: Family

## 2019-03-04 ENCOUNTER — Other Ambulatory Visit: Payer: Self-pay

## 2019-03-04 ENCOUNTER — Encounter: Payer: Self-pay | Admitting: Family

## 2019-03-04 VITALS — BP 162/62 | HR 74 | Temp 97.9°F | Resp 12 | Ht 63.0 in | Wt 149.3 lb

## 2019-03-04 DIAGNOSIS — I6522 Occlusion and stenosis of left carotid artery: Secondary | ICD-10-CM

## 2019-03-04 DIAGNOSIS — Z9889 Other specified postprocedural states: Secondary | ICD-10-CM

## 2019-03-04 NOTE — Progress Notes (Signed)
Chief Complaint: Follow up Extracranial Carotid Artery Stenosis   History of Present Illness  Margaret Butler is a 78 y.o. female who is s/p left internal carotid endarterectomy with Dacron patch angioplasty on 02-15-19 by Dr. Donzetta Matters for symptomatic left internal carotid artery stenosis.  Before the left CEA, she stats she had some garbled speech, no lateralizing weakness, no amaurosis. She denies any subsequent neurological event  She states that amlodipine caused her legs to be weak, this is improving with physical therapy. .   She returns today for 2 week follow up.   She had cardiac stenting in the Summer of 2019, denies any hx of MI. Her cardiologist is Dr. Bishop Limbo in [facility].    Diabetic: yes, 7.2 A1C on 02-13-19 Tobacco use: non-smoker  Pt meds include: Statin : yes ASA: yes, 325 mg daily Other anticoagulants/antiplatelets: Plavix   Past Medical History:  Diagnosis Date  . DM type 2 (diabetes mellitus, type 2) ([place]) 02/16/2019  . Hyperlipidemia   . Hypertension   . Left carotid stenosis 02/16/2019  . TIA (transient ischemic attack) 02/16/2019    Social History Social History   Tobacco Use  . Smoking status: Never Smoker  . Smokeless tobacco: Never Used  Substance Use Topics  . Alcohol use: Never    Frequency: Never  . Drug use: Never    Family History History reviewed. No pertinent family history.  Surgical History Past Surgical History:  Procedure Laterality Date  . ENDARTERECTOMY Left 02/15/2019   Procedure: ENDARTERECTOMY CAROTID;  Surgeon: [name], MD;  Location: [facility];  Service: Vascular;  Laterality: Left;  . PATCH ANGIOPLASTY Left 02/15/2019   Procedure: Patch Angioplasty Using Hemoshield patch size 0.8cm x 7.6cm;  Surgeon: [name], MD;  Location: [facility];  Service: Vascular;  Laterality: Left;    Allergies  Allergen Reactions  . Atorvastatin Other (See Comments)    Muscle pain  . Lisinopril Cough       .  Sertraline Other (See Comments)    Caused headaches   . Simvastatin Other (See Comments)    Muscle pain    Current Outpatient Medications  Medication Sig Dispense Refill  . aspirin 325 MG tablet Take 1 tablet (325 mg total) by mouth daily.    . blood glucose meter kit and supplies KIT Dispense based on patient and insurance preference. Use up to four times daily as directed. (FOR ICD-9 250.00, 250.01). 1 each 0  . Calcium Carbonate-Vitamin D (CALCIUM 600+D) 600-400 MG-UNIT tablet Take 1 tablet by mouth daily with breakfast.     . clopidogrel (PLAVIX) 75 MG tablet Take 75 mg by mouth daily at 8 pm.     . Dextromethorphan-guaiFENesin (CORICIDIN HBP CONGESTION/COUGH) 10-200 MG CAPS Take 1 capsule by mouth every 8 (eight) hours as needed (for congestion).    Marland Kitchen escitalopram (LEXAPRO) 10 MG tablet Take 10 mg by mouth daily after breakfast.     . Magnesium 250 MG TABS Take 500 mg by mouth daily after supper.    . metoprolol succinate (TOPROL-XL) 25 MG 24 hr tablet Take 50 mg by mouth daily after supper.     . Omega-3 Fatty Acids (FISH OIL) 1000 MG CAPS Take 1,000 mg by mouth daily with breakfast.    . Potassium Gluconate 595 MG TBCR Take 595 mg by mouth daily with breakfast.    . simvastatin (ZOCOR) 20 MG tablet Take 20 mg by mouth at bedtime.     Marland Kitchen telmisartan-hydrochlorothiazide (MICARDIS HCT)  80-25 MG tablet Take 1 tablet by mouth every morning.     . TURMERIC PO Take 1,500-3,000 mg by mouth daily with breakfast.     No current facility-administered medications for this visit.     Review of Systems : See HPI for pertinent positives and negatives.  Physical Examination  Vitals:   03/04/19 1315  BP: (!) 162/62  Pulse: 74  Resp: 12  Temp: 97.9 F (36.6 C)  TempSrc: Temporal  SpO2: 99%  Weight: 149 lb 4.8 oz (67.7 kg)  Height: '5\' 3"'$  (1.6 m)   Body mass index is 26.45 kg/m.  General: WDWN female in NAD GAIT: normal Eyes: PERRLA HENT: No gross abnormalities. Left CEA incision is  healed, no swelling, some surgical glue in place.  Pulmonary:  Respirations are non-labored, good air movement in all fields, CTAB, no rales,  rhonchi, or wheezing. Cardiac: regular rhythm, no detected murmur.  VASCULAR EXAM Carotid Bruits Right Left   Negative Negative     Abdominal aortic pulse is not palpable. Radial pulses are  palpable                                                                                                                           LE Pulses Right Left       POPLITEAL  not palpable   not palpable       POSTERIOR TIBIAL  not palpable   1+ palpable        DORSALIS PEDIS      ANTERIOR TIBIAL not palpable  not palpable     Gastrointestinal: soft, nontender, BS WNL, no r/g, no palpable masses. Musculoskeletal: no muscle atrophy/wasting. M/S 5/5 in upper extremities, 3/5 in LE's, extremities without ischemic changes Skin: No rashes, no ulcers, no cellulitis.   Neurologic:  A&O X 3; appropriate affect, sensation is normal; speech is normal, CN 2-12 intact except has noticeable hearing loss, pain and light touch intact in extremities, motor exam as listed above. Psychiatric: Normal thought content, mood appropriate to clinical situation.    Assessment: Margaret Butler is a 78 y.o. female who is s/p left internal carotid endarterectomy with Dacron patch angioplasty on 02-15-19 by Dr. Donzetta Matters for symptomatic left internal carotid artery stenosis.  Before the left CEA, she stats she had some garbled speech, no lateralizing weakness, no amaurosis. She denies any subsequent neurological event.  Left CEA incision is healed.   Plan: Follow-up in 5 months with Carotid Duplex scan.    I discussed in depth with the patient the nature of atherosclerosis, and emphasized the importance of maximal medical management including strict control of blood pressure, blood glucose, and lipid levels, obtaining regular exercise, and continued cessation of smoking.  The patient is  aware that without maximal medical management the underlying atherosclerotic disease process will progress, limiting the benefit of any interventions. The patient was given information about stroke prevention and what symptoms should prompt the patient to seek immediate medical care. Thank you  for allowing [place] to participate in this patient's care.  Clemon Chambers, RN, MSN, FNP-C [facility] Office: [phone]  Clinic Physician: Donzetta Matters  03/04/19 1:21 PM

## 2019-03-04 NOTE — Patient Instructions (Signed)

## 2019-03-28 NOTE — Progress Notes (Addendum)
[facility] [address] [place] [address] [phone]       HOSPITAL FOLLOW UP NOTE  Margaret Butler Date of Birth:  1940-12-03 Medical Record Number:  [id number]   Reason for Referral:  hospital stroke follow up    CHIEF COMPLAINT:  Chief Complaint  Patient presents with   Hospitalization Follow-up    Hospital stroke f/u. Alone. Rm 9. No new concerns at this time.     HPI: Margaret Butler being seen today for in office hospital follow-up regarding left brain TIA in setting of left ICA stenosis on 02/12/2019.  History obtained from patient and chart review. Reviewed all radiology images and labs personally.  Margaret Butler is a 78 y.o. female with history of hypertension,hyperlipidemia,CAD (placement of 2 coronary arterystents)  who presented on 02/12/2019 with transient aphasia. She did not receive IV t-PA due to resolution of deficits.  Stroke work-up likely left brain TIA in setting of left ICA stenosis.  CT head no acute infarct but did show moderately advanced chronic cerebral white matter disease.  MRI no acute finding.  CTA head/neck severe intracranial stenosis with basilar artery proximal third (near occlusion), right MCA M1 and right MCA posterior M2 along with moderate to severe arterial stenosis of right ICA supraclinoid segment and bilateral vertebral artery V4 segments and bilateral carotid bifurcation arthrosclerosis resulting in 50 to 60% stenosis of both ICA origins.  Carotid Doppler right ICA 40 to 59% stenosis, left ICA 60 to 79% stenosis VAs antegrade.  2D echo unremarkable without cardiac source of embolus identified.  Due to symptomatic left ICA stenosis, recommended left CEA which was performed on 02/15/2019 by Dr. Donzetta Matters with Dacron patch angioplasty. Recommended DAPT with aspirin 3 2 5  mg daily and clopidogrel 75 mg daily.  Hypertensive urgency upon arrival with SBP 238 which stabilized throughout admission.  LDL 122 and recommended  continuation of simvastatin 20 mg daily with history of muscle pain with statins A1c 7.2 without prior diagnosis of DM and refused initiation of therapy during admission and will follow up with PCP outpatient.  Other stroke risk factors include advanced age, overweight and CAD but no prior history of stroke.  Discharged home in stable condition with recommendations of home health PT/OT.  Stable from a neurological standpoint without recurring or new stroke/TIA symptoms She continues to work with physical therapy for lower extremity strengthening Continues on aspirin 81 mg daily and clopidogrel 75 mg daily without bleeding or bruising Continues on simvastatin with recent increase to 40 mg daily -declines use of Repatha at this time as this was discussed by PCP.  Denies myalgias with increased dose PCP recommended initiating medical regimen for DM management but patient declined Blood pressure elevated today's visit 146/82 reporting she has not taken blood pressure medications this morning due to leaving her house early for office visit.  She does monitor at home with typical levels 130-140/60-70 Follow-up visit with vascular surgery on 03/04/2019 and recommended repeat carotid ultrasound in 5 months time  Denies new or worsening stroke/TIA symptoms    ROS:   14 system review of systems performed and negative with exception of see HPI  PMH:  Past Medical History:  Diagnosis Date   DM type 2 (diabetes mellitus, type 2) ([place]) 02/16/2019   Hyperlipidemia    Hypertension    Left carotid stenosis 02/16/2019   TIA (transient ischemic attack) 02/16/2019    PSH:  Past Surgical History:  Procedure Laterality Date   ENDARTERECTOMY Left 02/15/2019  Procedure: ENDARTERECTOMY CAROTID;  Surgeon: [name], MD;  Location: [facility];  Service: Vascular;  Laterality: Left;   PATCH ANGIOPLASTY Left 02/15/2019   Procedure: Patch Angioplasty Using Hemoshield patch size 0.8cm x 7.6cm;  Surgeon:  [name], MD;  Location: [facility];  Service: Vascular;  Laterality: Left;    Social History:  Social History   Socioeconomic History   Marital status: Married    Spouse name: Not on file   Number of children: Not on file   Years of education: Not on file   Highest education level: Not on file  Occupational History   Not on file  Social Needs   Financial resource strain: Not on file   Food insecurity    Worry: Not on file    Inability: Not on file   Transportation needs    Medical: Not on file    Non-medical: Not on file  Tobacco Use   Smoking status: Never Smoker   Smokeless tobacco: Never Used  Substance and Sexual Activity   Alcohol use: Never    Frequency: Never   Drug use: Never   Sexual activity: Not on file  Lifestyle   Physical activity    Days per week: Not on file    Minutes per session: Not on file   Stress: Not on file  Relationships   Social connections    Talks on phone: Not on file    Gets together: Not on file    Attends religious service: Not on file    Active member of club or organization: Not on file    Attends meetings of clubs or organizations: Not on file    Relationship status: Not on file   Intimate partner violence    Fear of current or ex partner: Not on file    Emotionally abused: Not on file    Physically abused: Not on file    Forced sexual activity: Not on file  Other Topics Concern   Not on file  Social History Narrative   Not on file    Family History: History reviewed. No pertinent family history.  Medications:   Current Outpatient Medications on File Prior to Visit  Medication Sig Dispense Refill   aspirin EC 81 MG tablet Take 81 mg by mouth daily.     blood glucose meter kit and supplies KIT Dispense based on patient and insurance preference. Use up to four times daily as directed. (FOR ICD-9 250.00, 250.01). 1 each 0   Calcium Carbonate-Vitamin D (CALCIUM 600+D) 600-400 MG-UNIT tablet  Take 1 tablet by mouth daily with breakfast.      clopidogrel (PLAVIX) 75 MG tablet Take 75 mg by mouth daily at 8 pm.      Dextromethorphan-guaiFENesin (CORICIDIN HBP CONGESTION/COUGH) 10-200 MG CAPS Take 1 capsule by mouth every 8 (eight) hours as needed (for congestion).     escitalopram (LEXAPRO) 10 MG tablet Take 10 mg by mouth daily after breakfast.      Magnesium 250 MG TABS Take 500 mg by mouth daily after supper.     metoprolol succinate (TOPROL-XL) 25 MG 24 hr tablet Take 50 mg by mouth daily after supper.      Omega-3 Fatty Acids (FISH OIL) 1000 MG CAPS Take 1,000 mg by mouth daily with breakfast.     Potassium Gluconate 595 MG TBCR Take 595 mg by mouth daily with breakfast.     simvastatin (ZOCOR) 20 MG tablet Take 40 mg by mouth at bedtime.  telmisartan-hydrochlorothiazide (MICARDIS HCT) 80-25 MG tablet Take 1 tablet by mouth every morning.      TURMERIC PO Take 1,500-3,000 mg by mouth daily with breakfast.     No current facility-administered medications on file prior to visit.     Allergies:   Allergies  Allergen Reactions   Atorvastatin Other (See Comments)    Muscle pain   Lisinopril Cough        Sertraline Other (See Comments)    Caused headaches    Simvastatin Other (See Comments)    Muscle pain     Physical Exam  Vitals:   03/29/19 0754  BP: (!) 146/72  Pulse: 63  Temp: (!) 97.5 F (36.4 C)  TempSrc: Oral  Weight: 152 lb 3.2 oz (69 kg)  Height: 5' 3"  (1.6 m)   Body mass index is 26.96 kg/m. No exam data present  Depression screen [facility] 2/9 03/29/2019  Decreased Interest 0  Down, Depressed, Hopeless 0  PHQ - 2 Score 0     General: well developed, well nourished,  pleasant elderly Caucasian female, seated, in no evident distress Head: head normocephalic and atraumatic.   Neck: supple with no carotid or supraclavicular bruits Cardiovascular: regular rate and rhythm, no murmurs Musculoskeletal: no deformity Skin:  no  rash/petichiae Vascular:  Normal pulses all extremities   Neurologic Exam Mental Status: Awake and fully alert. Oriented to place and time. Recent and remote memory intact. Attention span, concentration and fund of knowledge appropriate. Mood and affect appropriate.  Cranial Nerves: Fundoscopic exam reveals sharp disc margins. Pupils equal, briskly reactive to light. Extraocular movements full without nystagmus. Visual fields full to confrontation. Hearing intact. Facial sensation intact. Face, tongue, palate moves normally and symmetrically.  Motor: Normal bulk and tone. Normal strength in all tested extremity muscles except mild bilateral hip flexor weakness Sensory.: intact to touch , pinprick , position and vibratory sensation.  Coordination: Rapid alternating movements normal in all extremities. Finger-to-nose and heel-to-shin performed accurately bilaterally. Gait and Station: Arises from chair without difficulty. Stance is normal. Gait demonstrates normal stride length and balance Reflexes: 1+ and symmetric. Toes downgoing.     NIHSS  0 Modified Rankin  0   Diagnostic Data (Labs, Imaging, Testing)  Ct Head Code Stroke Wo Contrast 02/12/2019 IMPRESSION:  1. No acute cortically based infarct or acute intracranial hemorrhage identified. ASPECTS 10.  2. Moderately advanced chronic cerebral white matter disease, most commonly due to chronic small vessel ischemia.   Ct Angio Head W Or Wo Contrast Ct Angio Neck W Or Wo Contrast 02/12/2019 IMPRESSION:  1. There are several age indeterminate Severe intracranial stenoses: - Basilar Artery proximal 3rd (near occlusion). - Right MCA M1. - Right MCA posterior M2.  2. Otherwise negative for large vessel occlusion, but there are additional moderate to severe arterial stenoses: - Right ICA supraclinoid segment. - bilateral vertebral artery V4 segments.  3. Bilateral carotid bifurcation atherosclerosis resulting in 50-60% stenosis of both ICA  origins.   MR Brain WO Contrast 02/13/2019 IMPRESSION: No acute finding by MRI. Moderate chronic appearing small vessel ischemic changes of the cerebral hemispheric white matter  Transthoracic Echocardiogram w/ bubble  02/13/2019 1. The left ventricle has normal systolic function with an ejection fraction of 60-65%. The cavity size was normal. There is moderately increased left ventricular wall thickness. Left ventricular diastolic Doppler parameters are consistent with impaired relaxation. Elevated mean left atrial pressure. 2. The right ventricle has normal systolic function. The cavity was normal. 3. The mitral valve  is grossly normal. There is moderate mitral annular calcification present. 4. Trivial pericardial effusion is present. 5. The aortic valve is tricuspid. Mild calcification of the aortic valve. Aortic valve regurgitation is trivial by color flow Doppler. No stenosis of the aortic valve. 6. Normal LV systolic function; moderate LVH; mild diastolic dysfunction; sclerotic aortic valve with trace AI; negative saline microcavitation study.  Bilateral Carotid Dopplers  02/13/2019 Right Carotid: Velocities in the right ICA are consistent with a 40-59% stenosis. Left Carotid: Velocities in the left ICA are consistent with a 60-79% stenosis. Vertebrals: Bilateral vertebral arteries demonstrate antegrade flow. Subclavians: Normal flow hemodynamics were seen in bilateral subclavian arteries.  EKG - SR rate 76 BPM. (See cardiology reading for complete details)    ASSESSMENT: Johannah Rozas is a 78 y.o. year old female presented with transient aphasia on 02/12/2019 with stroke work-up revealing left brain TIA in setting of left ICA stenosis s/p left ICA endarterectomy with Dacron patch angioplasty on 02/15/2019.  Vascular risk factors include HTN, HLD, new dx DM, carotid stenosis and CAD.  Recovered well from a stroke standpoint without residual deficits or reoccurring of  symptoms.    PLAN:  1. Left brain TIA: Continue aspirin 81 mg daily and clopidogrel 75 mg daily  and simvastatin 40 mg daily for secondary stroke prevention. Maintain strict control of hypertension with blood pressure goal below 130/90, diabetes with hemoglobin A1c goal below 6.5% and cholesterol with LDL cholesterol (bad cholesterol) goal below 70 mg/dL.  I also advised the patient to eat a healthy diet with plenty of whole grains, cereals, fruits and vegetables, exercise regularly with at least 30 minutes of continuous activity daily and maintain ideal body weight.  Ongoing participation with physical therapy as hip flexor weakness likely secondary to decreased physical activity 2. Left ICA stenosis s/p CEA with Dacron patch angioplasty: Follow-up with vascular surgery for repeat carotid ultrasound 6 months post procedure.  Advised DAPT duration will be determined by vascular surgery 3. HTN: Advised to continue current treatment regimen.  Today's BP 146/72.  Advised to continue to monitor at home along with continued follow-up with PCP for management 4. HLD: Advised to continue current treatment regimen along with continued follow-up with PCP for future prescribing and monitoring of lipid panel 5. DMII: Currently diet controlled per patient request.  PCP recommended initiating medication regimen but patient declined.  We will continue to follow with PCP for ongoing monitoring    Follow up in 4 months or call earlier if needed.  She may be establishing care with neurologist in [facility], [place] as this is closer to her home.  Advised her if this occurs, she can cancel 63-monthfollow-up visit.   Greater than 50% of time during this 45 minute visit was spent on counseling, explanation of diagnosis of left brain TIA, reviewing risk factor management of HTN, HLD and DM, planning of further management along with potential future management, and discussion with patient and family answering all  questions.    JFrann Rider AGNP-BC  [facility] [address] [id number] Phone [phone] [phone]: This document was prepared with digital dictation and possible smart phrase technology. Any transcriptional errors that result from this process are unintentional.

## 2019-03-29 ENCOUNTER — Encounter: Payer: Self-pay | Admitting: Adult Health

## 2019-03-29 ENCOUNTER — Other Ambulatory Visit: Payer: Self-pay

## 2019-03-29 ENCOUNTER — Ambulatory Visit (INDEPENDENT_AMBULATORY_CARE_PROVIDER_SITE_OTHER): Payer: Medicare Other | Admitting: Adult Health

## 2019-03-29 VITALS — BP 146/72 | HR 63 | Temp 97.5°F | Ht 63.0 in | Wt 152.2 lb

## 2019-03-29 DIAGNOSIS — G459 Transient cerebral ischemic attack, unspecified: Secondary | ICD-10-CM | POA: Diagnosis not present

## 2019-03-29 DIAGNOSIS — I6522 Occlusion and stenosis of left carotid artery: Secondary | ICD-10-CM

## 2019-03-29 DIAGNOSIS — I10 Essential (primary) hypertension: Secondary | ICD-10-CM | POA: Diagnosis not present

## 2019-03-29 DIAGNOSIS — E782 Mixed hyperlipidemia: Secondary | ICD-10-CM

## 2019-03-29 DIAGNOSIS — E119 Type 2 diabetes mellitus without complications: Secondary | ICD-10-CM

## 2019-03-29 NOTE — Patient Instructions (Signed)
Continue aspirin 81 mg daily and clopidogrel 75 mg daily  and simvastatin 40 mg for secondary stroke prevention  Continue to follow up with PCP regarding cholesterol, blood pressure and diabetes management   Follow-up with Dr. Donzetta Matters for repeat carotid ultrasound in 4 months -we will further advise you for ongoing need of aspirin and Plavix together  Follow-up with cardiology as scheduled  Continue to work with physical therapy for ongoing strengthening exercises  Continue to monitor blood pressure at home  Maintain strict control of hypertension with blood pressure goal below 130/90, diabetes with hemoglobin A1c goal below 6.5% and cholesterol with LDL cholesterol (bad cholesterol) goal below 70 mg/dL. I also advised the patient to eat a healthy diet with plenty of whole grains, cereals, fruits and vegetables, exercise regularly and maintain ideal body weight.  Followup in the future with me in 4 months or call earlier if needed       Thank you for coming to see [place] at [facility]. I hope we have been able to provide you high quality care today.  You may receive a patient satisfaction survey over the next few weeks. We would appreciate your feedback and comments so that we may continue to improve ourselves and the health of our patients.

## 2019-03-29 NOTE — Progress Notes (Signed)
I agree with the above plan 

## 2019-06-02 ENCOUNTER — Other Ambulatory Visit: Payer: Self-pay

## 2019-06-02 DIAGNOSIS — I6522 Occlusion and stenosis of left carotid artery: Secondary | ICD-10-CM

## 2019-06-03 ENCOUNTER — Telehealth (HOSPITAL_COMMUNITY): Payer: Self-pay | Admitting: *Deleted

## 2019-06-03 ENCOUNTER — Ambulatory Visit: Payer: Medicare Other | Admitting: Family

## 2019-06-03 ENCOUNTER — Encounter (HOSPITAL_COMMUNITY): Payer: Medicare Other

## 2019-06-03 NOTE — Telephone Encounter (Signed)

## 2019-06-06 ENCOUNTER — Other Ambulatory Visit: Payer: Self-pay

## 2019-06-06 ENCOUNTER — Encounter: Payer: Self-pay | Admitting: Family

## 2019-06-06 ENCOUNTER — Ambulatory Visit (HOSPITAL_COMMUNITY)
Admission: RE | Admit: 2019-06-06 | Discharge: 2019-06-06 | Disposition: A | Discharge status: Home / Self Care | Payer: Medicare Other | Source: Ambulatory Visit | Attending: Family | Admitting: Family

## 2019-06-06 ENCOUNTER — Ambulatory Visit (INDEPENDENT_AMBULATORY_CARE_PROVIDER_SITE_OTHER): Payer: Medicare Other | Admitting: Family

## 2019-06-06 VITALS — BP 179/72 | HR 76 | Temp 97.6°F | Resp 16 | Ht 63.5 in | Wt 149.0 lb

## 2019-06-06 DIAGNOSIS — I6523 Occlusion and stenosis of bilateral carotid arteries: Secondary | ICD-10-CM | POA: Diagnosis not present

## 2019-06-06 DIAGNOSIS — I739 Peripheral vascular disease, unspecified: Secondary | ICD-10-CM | POA: Diagnosis not present

## 2019-06-06 DIAGNOSIS — Z9889 Other specified postprocedural states: Secondary | ICD-10-CM

## 2019-06-06 DIAGNOSIS — R0989 Other specified symptoms and signs involving the circulatory and respiratory systems: Secondary | ICD-10-CM | POA: Diagnosis not present

## 2019-06-06 DIAGNOSIS — I6522 Occlusion and stenosis of left carotid artery: Secondary | ICD-10-CM | POA: Insufficient documentation

## 2019-06-06 NOTE — Progress Notes (Signed)
Chief Complaint: Follow up Extracranial Carotid Artery Stenosis   History of Present Illness  Margaret Butler is a 78 y.o. female who is s/p left internal carotid endarterectomy with Dacron patch angioplasty on 02-15-19 by Dr. Donzetta Matters for symptomatic left internal carotid artery stenosis.  Before the left CEA, she states she had some garbled speech, no lateralizing weakness, no amaurosis. She denies any subsequent neurological event.  She states that amlodipine caused her legs to be weak, this is improving with physical therapy. She states that after walking about 20 minutes her left calf feels tight, resolves with rest. 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   She denies chest pain or dyspnea, denies fever or chills.  She denies headache or dizziness.   She returns today at the request of her PCP, carotid bruit heard on the left per pt.    She had cardiac stenting in the Summer of 2019, denies any hx of MI. Her cardiologist is Dr. Bishop Limbo in [facility].   Diabetic: yes, 7.2 A1C on 02-13-19 Tobacco use: non-smoker  Pt meds include: Statin : yes ASA: yes, 81 mg daily Other anticoagulants/antiplatelets: Plavix   Past Medical History:  Diagnosis Date  . DM type 2 (diabetes mellitus, type 2) ([place]) 02/16/2019  . Hyperlipidemia   . Hypertension   . Left carotid stenosis 02/16/2019  . TIA (transient ischemic attack) 02/16/2019    Social History Social History   Tobacco Use  . Smoking status: Never Smoker  . Smokeless tobacco: Never Used  Substance Use Topics  . Alcohol use: Never    Frequency: Never  . Drug use: Never    Family History Family History  Problem Relation Age of Onset  . Heart attack Father     Surgical History Past Surgical History:  Procedure Laterality Date  . ENDARTERECTOMY Left 02/15/2019   Procedure: ENDARTERECTOMY CAROTID;  Surgeon: [name], MD;  Location: [facility];   Service: Vascular;  Laterality: Left;  . PATCH ANGIOPLASTY Left 02/15/2019   Procedure: Patch Angioplasty Using Hemoshield patch size 0.8cm x 7.6cm;  Surgeon: [name], MD;  Location: [place];  Service: Vascular;  Laterality: Left;    Allergies  Allergen Reactions  . Atorvastatin Other (See Comments)    Muscle pain  . Lisinopril Cough       . Sertraline Other (See Comments)    Caused headaches   . Simvastatin Other (See Comments)    Muscle pain    Current Outpatient Medications  Medication Sig Dispense Refill  . aspirin EC 81 MG tablet Take 81 mg by mouth daily.    . blood glucose meter kit and supplies KIT Dispense based on patient and insurance preference. Use up to four times daily as directed. (FOR ICD-9 250.00, 250.01). 1 each 0  . Calcium Carbonate-Vitamin D (CALCIUM 600+D) 600-400 MG-UNIT tablet Take 1 tablet by mouth daily with breakfast.     . clopidogrel (PLAVIX) 75 MG tablet Take 75 mg by mouth daily at 8 pm.     . escitalopram (LEXAPRO) 10 MG tablet Take 10 mg by mouth daily after breakfast.     . Magnesium 250 MG TABS Take 500 mg by mouth daily after supper.    . metoprolol succinate (TOPROL-XL) 25 MG 24 hr tablet Take 50 mg by mouth daily after supper.     . simvastatin (ZOCOR) 20 MG tablet Take 40 mg by mouth at bedtime.    Marland Kitchen  telmisartan-hydrochlorothiazide (MICARDIS HCT) 80-25 MG tablet Take 1 tablet by mouth every morning.     Marland Kitchen Dextromethorphan-guaiFENesin (CORICIDIN HBP CONGESTION/COUGH) 10-200 MG CAPS Take 1 capsule by mouth every 8 (eight) hours as needed (for congestion).    . Omega-3 Fatty Acids (FISH OIL) 1000 MG CAPS Take 1,000 mg by mouth daily with breakfast.    . Potassium Gluconate 595 MG TBCR Take 595 mg by mouth daily with breakfast.    . TURMERIC PO Take 1,500-3,000 mg by mouth daily with breakfast.     No current facility-administered medications for this visit.     Review of Systems : See HPI for pertinent positives and  negatives.  Physical Examination  Vitals:   06/06/19 1307 06/06/19 1329  BP: (!) 177/69 (!) 179/72  Pulse: 76 76  Resp: 16   Temp: 97.6 F (36.4 C)   TempSrc: Temporal   SpO2: 98%   Weight: 149 lb (67.6 kg)   Height: 5' 3.5" (1.613 m)    Body mass index is 25.98 kg/m.  General: WDWN female in NAD accompanied by her husband  GAIT: normal Eyes: Pupils are equal and round HENT: No gross abnormalities.  Pulmonary:  Respirations are non-labored, good air movement in all fields, no rales, rhonchi, or  wheezes. Cardiac: regular rhythm with occasional premature contraction, no detected murmur.  VASCULAR EXAM Carotid Bruits Right Left   Negative Positive     Abdominal aortic pulse is not palpable. Radial pulses are 2+ palpable and equal.                                                                                                                            LE Pulses Right Left       POPLITEAL  not palpable   not palpable       POSTERIOR TIBIAL  not palpable   not palpable        DORSALIS PEDIS      ANTERIOR TIBIAL not palpable  not palpable     Gastrointestinal: soft, nontender, BS WNL, no r/g, no palpable masses. Musculoskeletal: no muscle atrophy/wasting. M/S 4/5 throughout, extremities without ischemic changes Skin: No rashes, no ulcers, no cellulitis.   Neurologic:  A&O X 3; appropriate affect, sensation is normal; speech is normal, CN 2-12 intact, pain and light touch intact in extremities, motor exam as listed above. Psychiatric: Normal thought content, mood appropriate to clinical situation.    DATA Carotid Duplex (06-06-19): Right Carotid Findings: +----------+--------+--------+--------+------------------+--------+           PSV cm/sEDV cm/sStenosisPlaque DescriptionComments +----------+--------+--------+--------+------------------+--------+ CCA Prox  102     10                                          +----------+--------+--------+--------+------------------+--------+ CCA Mid   59      11                                         +----------+--------+--------+--------+------------------+--------+  CCA Distal60      12              heterogenous               +----------+--------+--------+--------+------------------+--------+ ICA Prox  193     52      40-59%  heterogenous               +----------+--------+--------+--------+------------------+--------+ ICA Mid   104     25                                         +----------+--------+--------+--------+------------------+--------+ ICA Distal111     31                                         +----------+--------+--------+--------+------------------+--------+ ECA       120     2                                          +----------+--------+--------+--------+------------------+--------+  +----------+--------+-------+----------------+-------------------+           PSV cm/sEDV cmsDescribe        Arm Pressure (mmHG) +----------+--------+-------+----------------+-------------------+ [id number]     10     Multiphasic, WNL                    +----------+--------+-------+----------------+-------------------+  +---------+--------+--+--------+-+---------+ VertebralPSV cm/s70EDV cm/s8Antegrade +---------+--------+--+--------+-+---------+     Left Carotid Findings: +----------+-------+--------+--------+--------------------------+--------------+           PSV    EDV cm/sStenosisPlaque Description        Comments                 cm/s                                                            +----------+-------+--------+--------+--------------------------+--------------+ CCA Prox  70     12                                                       +----------+-------+--------+--------+--------------------------+--------------+ CCA Mid   64     12                                                        +----------+-------+--------+--------+--------------------------+--------------+ CCA Distal88     18              heterogenous                             +----------+-------+--------+--------+--------------------------+--------------+ ICA Prox  512    159     80-99%  homogeneous and  bruit observed                                  heterogenous                             +----------+-------+--------+--------+--------------------------+--------------+ ICA Mid   147    30                                                       +----------+-------+--------+--------+--------------------------+--------------+ ICA Distal101    27                                                       +----------+-------+--------+--------+--------------------------+--------------+ ECA       176    1                                                        +----------+-------+--------+--------+--------------------------+--------------+  +----------+--------+--------+--------+-------------------+           PSV cm/sEDV cm/sDescribeArm Pressure (mmHG) +----------+--------+--------+--------+-------------------+ Subclavian192     8                                   +----------+--------+--------+--------+-------------------+  +---------+--------+--+--------+-+---------+ VertebralPSV cm/s42EDV cm/s9Antegrade +---------+--------+--+--------+-+---------+ Summary: Right Carotid: Velocities in the right ICA are consistent with a 40-59%                stenosis. Left Carotid: Velocities in the left ICA are consistent with a 80-99% stenosis               s/p endarterectomy. Vertebrals:  Bilateral vertebral arteries demonstrate antegrade flow. Subclavians: Normal flow hemodynamics were seen in bilateral subclavian arteries. Stable right ICA stenosis at 40-59%, increased stenosis in the left ICA at 80-99% today, compared  to the exam on 02-13-19.    Assessment: Margaret Butler is a 78 y.o. female who is s/p left internal carotid endarterectomy with Dacron patch angioplasty on 02-15-19 by Dr. Donzetta Matters for symptomatic left internal carotid artery stenosis.  Before the left CEA, she states she had some garbled speech, no lateralizing weakness, no amaurosis. She denies any subsequent neurological event.  Today's carotid duplex shows 40-59% right ICA stenosis and 80-99% left ICA stenosis. Stable stenosis in the right ICA, increased stenosis in the left ICA compared to the exam on 02-13-19.   Pt voiced desire to address left carotid ICA with conservative measures. Will schedule pt to see Dr. Donzetta Matters ASAP to discuss conservative vs surgical measures to address left ICA stenosis.   Will obtain ABI's on her return: left calf claudication after 20 minutes walking and non palpable pedal pulses with no signs of ischemia in her lower extremities.   Her atherosclerotic risk factors include uncontrolled DM, dyslipidemia, and hypertension.  She takes a daily 81 mg ASA, Plavix, and a statin.    Plan: ABI's  ASAP, and see Dr. Donzetta Matters ASAP to discuss asymptomatic left ICA stenosis at 80-99% stenosis.   I discussed in depth with the patient the nature of atherosclerosis, and emphasized the importance of maximal medical management including strict control of blood pressure, blood glucose, and lipid levels, obtaining regular exercise, and continued cessation of smoking.  The patient is aware that without maximal medical management the underlying atherosclerotic disease process will progress, limiting the benefit of any interventions. The patient was given information about stroke prevention and what symptoms should prompt the patient to seek immediate medical care. Thank you for allowing [place] to participate in this patient's care.  Clemon Chambers, RN, MSN, FNP-C [facility] Office: [phone]  Clinic  Physician: Trula Slade  06/06/19 1:32 PM

## 2019-06-06 NOTE — Patient Instructions (Signed)

## 2019-06-17 ENCOUNTER — Other Ambulatory Visit: Payer: Self-pay

## 2019-06-17 DIAGNOSIS — R0989 Other specified symptoms and signs involving the circulatory and respiratory systems: Secondary | ICD-10-CM

## 2019-06-24 ENCOUNTER — Ambulatory Visit (HOSPITAL_COMMUNITY)
Admission: RE | Admit: 2019-06-24 | Discharge: 2019-06-24 | Disposition: A | Discharge status: Home / Self Care | Payer: Medicare Other | Source: Ambulatory Visit | Attending: Vascular Surgery | Admitting: Vascular Surgery

## 2019-06-24 ENCOUNTER — Other Ambulatory Visit: Payer: Self-pay

## 2019-06-24 ENCOUNTER — Encounter: Payer: Self-pay | Admitting: Vascular Surgery

## 2019-06-24 ENCOUNTER — Ambulatory Visit (INDEPENDENT_AMBULATORY_CARE_PROVIDER_SITE_OTHER): Payer: Medicare Other | Admitting: Vascular Surgery

## 2019-06-24 VITALS — BP 140/74 | HR 76 | Temp 97.5°F | Resp 20 | Ht 63.5 in | Wt 155.0 lb

## 2019-06-24 DIAGNOSIS — R0989 Other specified symptoms and signs involving the circulatory and respiratory systems: Secondary | ICD-10-CM | POA: Diagnosis present

## 2019-06-24 DIAGNOSIS — I6522 Occlusion and stenosis of left carotid artery: Secondary | ICD-10-CM

## 2019-06-24 NOTE — Progress Notes (Signed)
Patient ID: Margaret Butler, female   DOB: [birth year], 78 y.o.   MRN: [id number]  Reason for Consult: Follow-up   Referred by Kristopher Glee., MD  Subjective:     HPI:  Margaret Butler is a 78 y.o. female underwent left carotid endarterectomy for high-grade symptomatic left carotid stenosis.  At the time she had slurred speech which did resolve was considered TIA.  She recently followed up was found to have high-grade recurrent stenosis.  She has no recurrent symptoms.  She does have some limitations with walking but does not walk very much per her husband.  She had ABIs checked today.  Does not have tissue loss or ulceration.  Overall she is doing well does not have any new complaints related to today's visit.  Past Medical History:  Diagnosis Date  . DM type 2 (diabetes mellitus, type 2) ([place]) 02/16/2019  . Hyperlipidemia   . Hypertension   . Left carotid stenosis 02/16/2019  . TIA (transient ischemic attack) 02/16/2019   Family History  Problem Relation Age of Onset  . Heart attack Father    Past Surgical History:  Procedure Laterality Date  . ENDARTERECTOMY Left 02/15/2019   Procedure: ENDARTERECTOMY CAROTID;  Surgeon: [name], MD;  Location: [facility];  Service: Vascular;  Laterality: Left;  . PATCH ANGIOPLASTY Left 02/15/2019   Procedure: Patch Angioplasty Using Hemoshield patch size 0.8cm x 7.6cm;  Surgeon: [name], MD;  Location: [facility];  Service: Vascular;  Laterality: Left;    Short Social History:  Social History   Tobacco Use  . Smoking status: Never Smoker  . Smokeless tobacco: Never Used  Substance Use Topics  . Alcohol use: Never    Frequency: Never    Allergies  Allergen Reactions  . Atorvastatin Other (See Comments)    Muscle pain  . Lisinopril Cough       . Sertraline Other (See Comments)    Caused headaches   . Simvastatin Other (See Comments)    Muscle pain    Current Outpatient Medications  Medication Sig Dispense  Refill  . aspirin EC 81 MG tablet Take 81 mg by mouth daily.    . blood glucose meter kit and supplies KIT Dispense based on patient and insurance preference. Use up to four times daily as directed. (FOR ICD-9 250.00, 250.01). 1 each 0  . Calcium Carbonate-Vitamin D (CALCIUM 600+D) 600-400 MG-UNIT tablet Take 1 tablet by mouth daily with breakfast.     . clopidogrel (PLAVIX) 75 MG tablet Take 75 mg by mouth daily at 8 pm.     . Dextromethorphan-guaiFENesin (CORICIDIN HBP CONGESTION/COUGH) 10-200 MG CAPS Take 1 capsule by mouth every 8 (eight) hours as needed (for congestion).    Marland Kitchen escitalopram (LEXAPRO) 10 MG tablet Take 10 mg by mouth daily after breakfast.     . Magnesium 250 MG TABS Take 500 mg by mouth daily after supper.    . metoprolol succinate (TOPROL-XL) 25 MG 24 hr tablet Take 50 mg by mouth daily after supper.     . Omega-3 Fatty Acids (FISH OIL) 1000 MG CAPS Take 1,000 mg by mouth daily with breakfast.    . Potassium Gluconate 595 MG TBCR Take 595 mg by mouth daily with breakfast.    . simvastatin (ZOCOR) 20 MG tablet Take 40 mg by mouth at bedtime.    Marland Kitchen telmisartan-hydrochlorothiazide (MICARDIS HCT) 80-25 MG tablet Take 1 tablet by mouth every morning.     . TURMERIC PO Take 1,500-3,000  mg by mouth daily with breakfast.     No current facility-administered medications for this visit.     Review of Systems  Constitutional:  Constitutional negative. HENT: HENT negative.  Eyes: Eyes negative.  Cardiovascular: Positive for claudication.  GI: Gastrointestinal negative.  Musculoskeletal: Positive for leg pain.  Skin: Skin negative.  Neurological: Neurological negative. Hematologic: Hematologic/lymphatic negative.  Psychiatric: Psychiatric negative.        Objective:  Objective   Vitals:   06/24/19 0857  BP: 140/74  Pulse: 76  Resp: 20  Temp: (!) 97.5 F (36.4 C)  SpO2: 97%  Weight: 155 lb (70.3 kg)  Height: 5' 3.5" (1.613 m)   Body mass index is 27.03 kg/m.   Physical Exam HENT:     Head: Normocephalic.     Nose: Nose normal.     Mouth/Throat:     Mouth: Mucous membranes are moist.  Eyes:     Pupils: Pupils are equal, round, and reactive to light.  Neck:     Vascular: No carotid bruit.  Cardiovascular:     Rate and Rhythm: Normal rate and regular rhythm.     Pulses:          Femoral pulses are 2+ on the right side and 2+ on the left side.      Popliteal pulses are 0 on the right side and 0 on the left side.  Pulmonary:     Effort: Pulmonary effort is normal.  Abdominal:     General: Abdomen is flat.     Palpations: Abdomen is soft.  Musculoskeletal: Normal range of motion.        General: No swelling.  Skin:    Capillary Refill: Capillary refill takes less than 2 seconds.  Neurological:     Mental Status: She is alert.  Psychiatric:        Mood and Affect: Mood normal.        Behavior: Behavior normal.        Thought Content: Thought content normal.        Judgment: Judgment normal.     Data: I independently interpreted her ABIs to be 0.85 right and monophasic with toe pressure 101 and left 0.7 and monophasic and toe pressure 94.  I also reviewed her carotid duplexes which demonstrates stable 40 to 59% stenosis on the right and on the left she has recurrent 80-99% stenosis at the site of the endarterectomy with peak systolic velocity greater than 500 the proximal ICA.       Assessment/Plan:    78 year old female status post left carotid endarterectomy with patch angioplasty who unfortunately has an early recurrence.  I discussed with her proceeding with CT angio to evaluate.  We will discuss options after this which will likely include medical management versus recurrent surgical intervention versus stenting from a transcarotid with transfemoral route.  She continues on aspirin and Plavix and Zocor.  We will get the CT scan and have her follow-up.  We discussed the signs symptoms of stroke which she does know well.      [name] MD [facility]

## 2019-06-27 ENCOUNTER — Other Ambulatory Visit: Payer: Self-pay

## 2019-06-27 DIAGNOSIS — I6522 Occlusion and stenosis of left carotid artery: Secondary | ICD-10-CM

## 2019-07-11 ENCOUNTER — Encounter (HOSPITAL_COMMUNITY): Payer: Self-pay

## 2019-07-11 ENCOUNTER — Ambulatory Visit (HOSPITAL_COMMUNITY)
Admission: RE | Admit: 2019-07-11 | Discharge: 2019-07-11 | Disposition: A | Discharge status: Home / Self Care | Payer: Medicare Other | Source: Ambulatory Visit | Attending: Surgery | Admitting: Surgery

## 2019-07-11 ENCOUNTER — Other Ambulatory Visit: Payer: Self-pay

## 2019-07-11 DIAGNOSIS — I6522 Occlusion and stenosis of left carotid artery: Secondary | ICD-10-CM | POA: Diagnosis present

## 2019-07-11 LAB — POCT I-STAT, CHEM 8
BUN: 13 mg/dL (ref 8–23)
Calcium, Ion: 1.2 mmol/L (ref 1.15–1.40)
Chloride: 98 mmol/L (ref 98–111)
Creatinine, Ser: 0.4 mg/dL — ABNORMAL LOW (ref 0.44–1.00)
Glucose, Bld: 115 mg/dL — ABNORMAL HIGH (ref 70–99)
HCT: 38 % (ref 36.0–46.0)
Hemoglobin: 12.9 g/dL (ref 12.0–15.0)
Potassium: 3.3 mmol/L — ABNORMAL LOW (ref 3.5–5.1)
Sodium: 140 mmol/L (ref 135–145)
TCO2: 26 mmol/L (ref 22–32)

## 2019-07-11 MED ORDER — IOHEXOL 350 MG/ML SOLN
50.0000 mL | Freq: Once | INTRAVENOUS | Status: AC | PRN
  Administered 2019-07-11: 50 mL via INTRAVENOUS

## 2019-07-15 ENCOUNTER — Other Ambulatory Visit: Payer: Self-pay | Admitting: *Deleted

## 2019-07-15 ENCOUNTER — Other Ambulatory Visit: Payer: Self-pay

## 2019-07-15 ENCOUNTER — Encounter: Payer: Self-pay | Admitting: Vascular Surgery

## 2019-07-15 ENCOUNTER — Encounter: Payer: Self-pay | Admitting: *Deleted

## 2019-07-15 ENCOUNTER — Ambulatory Visit (INDEPENDENT_AMBULATORY_CARE_PROVIDER_SITE_OTHER): Payer: Medicare Other | Admitting: Vascular Surgery

## 2019-07-15 VITALS — BP 138/80 | HR 74 | Temp 98.0°F | Resp 18 | Ht 63.5 in | Wt 155.5 lb

## 2019-07-15 DIAGNOSIS — I6522 Occlusion and stenosis of left carotid artery: Secondary | ICD-10-CM

## 2019-07-15 MED ORDER — CLOPIDOGREL BISULFATE 75 MG PO TABS: 75.0000 mg | ORAL_TABLET | Freq: Every day | ORAL | 2 refills | Status: DC

## 2019-07-15 NOTE — Progress Notes (Signed)
Patient ID: Margaret Butler, female   DOB: March 21, 1941, 78 y.o.   MRN: [id number]  Reason for Consult: Follow-up   Referred by Kristopher Glee., MD  Subjective:     HPI:  Margaret Butler is a 78 y.o. female underwent left carotid endarterectomy last summer for TIA with slurred speech.  Unfortunate was found to have high-grade recurrent stenosis in October.  She also has bilateral lower extremity pain no tissue loss or ulceration ABIs have been evaluated.  She follows up today with CT angio of her neck and head.  She is taking aspirin and a statin but has recently stopped taking Plavix.  Past Medical History:  Diagnosis Date  . DM type 2 (diabetes mellitus, type 2) ([place]) 02/16/2019  . Hyperlipidemia   . Hypertension   . Left carotid stenosis 02/16/2019  . TIA (transient ischemic attack) 02/16/2019   Family History  Problem Relation Age of Onset  . Heart attack Father    Past Surgical History:  Procedure Laterality Date  . ENDARTERECTOMY Left 02/15/2019   Procedure: ENDARTERECTOMY CAROTID;  Surgeon: [name], MD;  Location: [place];  Service: Vascular;  Laterality: Left;  . PATCH ANGIOPLASTY Left 02/15/2019   Procedure: Patch Angioplasty Using Hemoshield patch size 0.8cm x 7.6cm;  Surgeon: [name], MD;  Location: [facility];  Service: Vascular;  Laterality: Left;    Short Social History:  Social History   Tobacco Use  . Smoking status: Never Smoker  . Smokeless tobacco: Never Used  Substance Use Topics  . Alcohol use: Never    Frequency: Never    Allergies  Allergen Reactions  . Atorvastatin Other (See Comments)    Muscle pain  . Lisinopril Cough       . Sertraline Other (See Comments)    Caused headaches   . Simvastatin Other (See Comments)    Muscle pain    Current Outpatient Medications  Medication Sig Dispense Refill  . aspirin EC 81 MG tablet Take 81 mg by mouth daily.    . blood glucose meter kit and supplies KIT Dispense based on  patient and insurance preference. Use up to four times daily as directed. (FOR ICD-9 250.00, 250.01). 1 each 0  . Calcium Carbonate-Vitamin D (CALCIUM 600+D) 600-400 MG-UNIT tablet Take 1 tablet by mouth daily with breakfast.     . clopidogrel (PLAVIX) 75 MG tablet Take 75 mg by mouth daily at 8 pm.     . Dextromethorphan-guaiFENesin (CORICIDIN HBP CONGESTION/COUGH) 10-200 MG CAPS Take 1 capsule by mouth every 8 (eight) hours as needed (for congestion).    Marland Kitchen escitalopram (LEXAPRO) 10 MG tablet Take 10 mg by mouth daily after breakfast.     . Magnesium 250 MG TABS Take 500 mg by mouth daily after supper.    . metoprolol succinate (TOPROL-XL) 25 MG 24 hr tablet Take 50 mg by mouth daily after supper.     . Omega-3 Fatty Acids (FISH OIL) 1000 MG CAPS Take 1,000 mg by mouth daily with breakfast.    . Potassium Gluconate 595 MG TBCR Take 595 mg by mouth daily with breakfast.    . simvastatin (ZOCOR) 20 MG tablet Take 40 mg by mouth at bedtime.    Marland Kitchen telmisartan-hydrochlorothiazide (MICARDIS HCT) 80-25 MG tablet Take 1 tablet by mouth every morning.     . TURMERIC PO Take 1,500-3,000 mg by mouth daily with breakfast.     No current facility-administered medications for this visit.     Review of  Systems  Constitutional:  Constitutional negative. HENT: HENT negative.  Eyes: Eyes negative.  Respiratory: Respiratory negative.  Cardiovascular: Positive for claudication.  GI: Gastrointestinal negative.  Musculoskeletal: Positive for leg pain.  Neurological: Neurological negative. Hematologic: Hematologic/lymphatic negative.  Psychiatric: Psychiatric negative.        Objective:  Objective   Vitals:   07/15/19 1036  BP: 138/80  Pulse: 74  Resp: 18  Temp: 98 F (36.7 C)  SpO2: 99%  Weight: 155 lb 8 oz (70.5 kg)  Height: 5' 3.5" (1.613 m)   Body mass index is 27.11 kg/m.  Physical Exam HENT:     Head: Normocephalic.     Nose: Nose normal.  Eyes:     Pupils: Pupils are equal, round,  and reactive to light.  Cardiovascular:     Rate and Rhythm: Normal rate.  Pulmonary:     Effort: Pulmonary effort is normal.  Abdominal:     Palpations: Abdomen is soft.  Musculoskeletal: Normal range of motion.  Skin:    Capillary Refill: Capillary refill takes less than 2 seconds.  Neurological:     General: No focal deficit present.     Mental Status: She is alert.  Psychiatric:        Mood and Affect: Mood normal.        Behavior: Behavior normal.        Thought Content: Thought content normal.        Judgment: Judgment normal.     Data: I reviewed her CT angio of the head and neck which demonstrates interval left carotid endarterectomy with stenosis at 85%.  Stable right ICA stenosis 65%.     Assessment/Plan:     78 year old female status post left carotid endarterectomy for symptomatic disease.  Unfortunately has had high-grade recurrent stenosis measured by CT at 85% by NASCET criteria with a peak systolic velocity greater than 500 by duplex.  I discussed with her the options being watchful waiting with maximal medical therapy versus stenting.  We will proceed with stenting given the high-grade nature of the stenosis.  She will need to be restarted on Plavix we will send this to the pharmacy today.  She will continue aspirin, Plavix, statin.  I discussed the risk benefits alternatives as well as details of the procedure with patient and her husband and all questions were answered.  We will get this scheduled in the near future.     [name] MD [facility] [address]

## 2019-07-18 ENCOUNTER — Other Ambulatory Visit (HOSPITAL_COMMUNITY)
Admission: RE | Admit: 2019-07-18 | Discharge: 2019-07-18 | Disposition: A | Discharge status: Home / Self Care | Payer: Medicare Other | Source: Ambulatory Visit | Attending: Vascular Surgery | Admitting: Vascular Surgery

## 2019-07-18 LAB — SARS CORONAVIRUS 2 (TAT 6-24 HRS): SARS Coronavirus 2: NEGATIVE

## 2019-07-19 ENCOUNTER — Encounter (HOSPITAL_COMMUNITY): Payer: Self-pay | Admitting: *Deleted

## 2019-07-19 ENCOUNTER — Ambulatory Visit: Payer: Medicare Other | Admitting: Adult Health

## 2019-07-19 NOTE — Progress Notes (Signed)
Spoke with pt for pre-op call. Pt has hx of CAD. One stent placed in 2019. Denies any recent chest pain or sob. Cardiologist is Talbert Cage, NP. Last office visit was 04/07/19. Pt states she is a type 2 diabetic. Last A1C was 7.2 on 02/13/19. She states she checks her blood sugar occasionally but doesn't usually do it before breakfast. Instructed pt to check her blood sugar when she gets up in the AM. If blood sugar is 70 or below, treat with 1/2 cup of clear juice (apple or cranberry) and recheck blood sugar 15 minutes after drinking juice. Instructed pt to let nurse know if she had to drink the juice that morning. She voiced understanding.   Pt understands that she needs to take her Plavix and Zocor tonight and her Aspirin in the AM.  Pt had her Covid test done yesterday, it is negative. She states she's been in quarantine since the test was done and understands that she needs to stay in quarantine until she comes to the hospital tomorrow.   Pt informed of visitation policy and voiced understanding.

## 2019-07-20 ENCOUNTER — Encounter (HOSPITAL_COMMUNITY): Payer: Self-pay

## 2019-07-20 ENCOUNTER — Other Ambulatory Visit: Payer: Self-pay

## 2019-07-20 ENCOUNTER — Encounter (HOSPITAL_COMMUNITY)
Admission: RE | Disposition: A | Discharge status: Home / Self Care | Payer: Self-pay | Source: Home / Self Care | Attending: Vascular Surgery

## 2019-07-20 ENCOUNTER — Inpatient Hospital Stay (HOSPITAL_COMMUNITY): Payer: Medicare Other | Admitting: Certified Registered Nurse Anesthetist

## 2019-07-20 ENCOUNTER — Inpatient Hospital Stay (HOSPITAL_COMMUNITY)
Admission: RE | Admit: 2019-07-20 | Discharge: 2019-07-21 | DRG: 036 | Disposition: A | Discharge status: Home / Self Care | Payer: Medicare Other | Attending: Vascular Surgery | Admitting: Vascular Surgery

## 2019-07-20 ENCOUNTER — Inpatient Hospital Stay (HOSPITAL_COMMUNITY): Payer: Medicare Other

## 2019-07-20 DIAGNOSIS — Z79899 Other long term (current) drug therapy: Secondary | ICD-10-CM | POA: Diagnosis not present

## 2019-07-20 DIAGNOSIS — I6522 Occlusion and stenosis of left carotid artery: Secondary | ICD-10-CM | POA: Diagnosis not present

## 2019-07-20 DIAGNOSIS — I2510 Atherosclerotic heart disease of native coronary artery without angina pectoris: Secondary | ICD-10-CM | POA: Diagnosis present

## 2019-07-20 DIAGNOSIS — Z8673 Personal history of transient ischemic attack (TIA), and cerebral infarction without residual deficits: Secondary | ICD-10-CM | POA: Diagnosis not present

## 2019-07-20 DIAGNOSIS — Z888 Allergy status to other drugs, medicaments and biological substances status: Secondary | ICD-10-CM | POA: Diagnosis not present

## 2019-07-20 DIAGNOSIS — E119 Type 2 diabetes mellitus without complications: Secondary | ICD-10-CM | POA: Diagnosis present

## 2019-07-20 DIAGNOSIS — I6529 Occlusion and stenosis of unspecified carotid artery: Secondary | ICD-10-CM | POA: Diagnosis present

## 2019-07-20 DIAGNOSIS — E785 Hyperlipidemia, unspecified: Secondary | ICD-10-CM | POA: Diagnosis present

## 2019-07-20 DIAGNOSIS — I10 Essential (primary) hypertension: Secondary | ICD-10-CM | POA: Diagnosis present

## 2019-07-20 DIAGNOSIS — Z7982 Long term (current) use of aspirin: Secondary | ICD-10-CM | POA: Diagnosis not present

## 2019-07-20 DIAGNOSIS — Z955 Presence of coronary angioplasty implant and graft: Secondary | ICD-10-CM

## 2019-07-20 DIAGNOSIS — Z7902 Long term (current) use of antithrombotics/antiplatelets: Secondary | ICD-10-CM | POA: Diagnosis not present

## 2019-07-20 DIAGNOSIS — Z20828 Contact with and (suspected) exposure to other viral communicable diseases: Secondary | ICD-10-CM | POA: Diagnosis present

## 2019-07-20 HISTORY — DX: Anemia, unspecified: D64.9

## 2019-07-20 HISTORY — DX: Unspecified osteoarthritis, unspecified site: M19.90

## 2019-07-20 HISTORY — DX: Gastro-esophageal reflux disease without esophagitis: K21.9

## 2019-07-20 HISTORY — PX: TRANSCAROTID ARTERY REVASCULARIZATIONÂ: SHX6778

## 2019-07-20 HISTORY — DX: Depression, unspecified: F32.A

## 2019-07-20 HISTORY — DX: Atherosclerotic heart disease of native coronary artery without angina pectoris: I25.10

## 2019-07-20 HISTORY — DX: Constipation, unspecified: K59.00

## 2019-07-20 LAB — URINALYSIS, ROUTINE W REFLEX MICROSCOPIC
Bilirubin Urine: NEGATIVE
Glucose, UA: NEGATIVE mg/dL
Hgb urine dipstick: NEGATIVE
Ketones, ur: NEGATIVE mg/dL
Nitrite: NEGATIVE
Protein, ur: NEGATIVE mg/dL
Specific Gravity, Urine: 1.017 (ref 1.005–1.030)
pH: 6 (ref 5.0–8.0)

## 2019-07-20 LAB — SURGICAL PCR SCREEN
MRSA, PCR: NEGATIVE
Staphylococcus aureus: NEGATIVE

## 2019-07-20 LAB — TYPE AND SCREEN
ABO/RH(D): O POS
Antibody Screen: NEGATIVE

## 2019-07-20 LAB — CBC
HCT: 41.8 % (ref 36.0–46.0)
Hemoglobin: 13.7 g/dL (ref 12.0–15.0)
MCH: 27.6 pg (ref 26.0–34.0)
MCHC: 32.8 g/dL (ref 30.0–36.0)
MCV: 84.1 fL (ref 80.0–100.0)
Platelets: 246 10*3/uL (ref 150–400)
RBC: 4.97 MIL/uL (ref 3.87–5.11)
RDW: 14 % (ref 11.5–15.5)
WBC: 9 10*3/uL (ref 4.0–10.5)
nRBC: 0 % (ref 0.0–0.2)

## 2019-07-20 LAB — COMPREHENSIVE METABOLIC PANEL
ALT: 89 U/L — ABNORMAL HIGH (ref 0–44)
AST: 58 U/L — ABNORMAL HIGH (ref 15–41)
Albumin: 3.6 g/dL (ref 3.5–5.0)
Alkaline Phosphatase: 43 U/L (ref 38–126)
Anion gap: 11 (ref 5–15)
BUN: 14 mg/dL (ref 8–23)
CO2: 26 mmol/L (ref 22–32)
Calcium: 8.9 mg/dL (ref 8.9–10.3)
Chloride: 104 mmol/L (ref 98–111)
Creatinine, Ser: 0.52 mg/dL (ref 0.44–1.00)
GFR calc Af Amer: >60 mL/min (ref >60)
GFR calc non Af Amer: >60 mL/min (ref >60)
Glucose, Bld: 169 mg/dL — ABNORMAL HIGH (ref 70–99)
Potassium: 3.7 mmol/L (ref 3.5–5.1)
Sodium: 141 mmol/L (ref 135–145)
Total Bilirubin: 0.8 mg/dL (ref 0.3–1.2)
Total Protein: 6.3 g/dL — ABNORMAL LOW (ref 6.5–8.1)

## 2019-07-20 LAB — GLUCOSE, CAPILLARY
Glucose-Capillary: 170 mg/dL — ABNORMAL HIGH (ref 70–99)
Glucose-Capillary: 148 mg/dL — ABNORMAL HIGH (ref 70–99)
Glucose-Capillary: 228 mg/dL — ABNORMAL HIGH (ref 70–99)
Glucose-Capillary: 281 mg/dL — ABNORMAL HIGH (ref 70–99)

## 2019-07-20 LAB — ABO/RH: ABO/RH(D): O POS

## 2019-07-20 LAB — PROTIME-INR
INR: 0.9 (ref 0.8–1.2)
Prothrombin Time: 12.5 seconds (ref 11.4–15.2)

## 2019-07-20 LAB — APTT: aPTT: 28 seconds (ref 24–36)

## 2019-07-20 SURGERY — TRANSCAROTID ARTERY REVASCULARIZATION (TCAR)
Anesthesia: General | Site: Neck | Laterality: Left

## 2019-07-20 MED ORDER — IODIXANOL 320 MG/ML IV SOLN
INTRAVENOUS | Status: DC | PRN
  Administered 2019-07-20: 50 mL

## 2019-07-20 MED ORDER — CHLORHEXIDINE GLUCONATE CLOTH 2 % EX PADS: 6.0000 | MEDICATED_PAD | Freq: Once | CUTANEOUS | Status: DC

## 2019-07-20 MED ORDER — POTASSIUM CHLORIDE CRYS ER 20 MEQ PO TBCR: 20.0000 meq | EXTENDED_RELEASE_TABLET | Freq: Every day | ORAL | Status: DC | PRN

## 2019-07-20 MED ORDER — ACETAMINOPHEN 325 MG RE SUPP: 325.0000 mg | RECTAL | Status: DC | PRN

## 2019-07-20 MED ORDER — ONDANSETRON HCL 4 MG/2ML IJ SOLN: 4.0000 mg | Freq: Four times a day (QID) | INTRAMUSCULAR | Status: DC | PRN

## 2019-07-20 MED ORDER — SIMVASTATIN 20 MG PO TABS
40.0000 mg | ORAL_TABLET | Freq: Every day | ORAL | Status: DC
  Administered 2019-07-20: 40 mg via ORAL
  Filled 2019-07-20: qty 2

## 2019-07-20 MED ORDER — GLYCOPYRROLATE PF 0.2 MG/ML IJ SOSY
PREFILLED_SYRINGE | INTRAMUSCULAR | Status: DC | PRN
  Administered 2019-07-20: .2 mg via INTRAVENOUS

## 2019-07-20 MED ORDER — HYDROCHLOROTHIAZIDE 25 MG PO TABS
25.0000 mg | ORAL_TABLET | Freq: Every day | ORAL | Status: DC
  Administered 2019-07-21: 25 mg via ORAL
  Filled 2019-07-20: qty 1

## 2019-07-20 MED ORDER — LIDOCAINE HCL (PF) 1 % IJ SOLN
INTRAMUSCULAR | Status: AC
  Filled 2019-07-20: qty 30

## 2019-07-20 MED ORDER — LACTATED RINGERS IV SOLN
INTRAVENOUS | Status: DC | PRN
  Administered 2019-07-20: 09:00:00 via INTRAVENOUS

## 2019-07-20 MED ORDER — SODIUM CHLORIDE 0.9 % IV SOLN
INTRAVENOUS | Status: DC | PRN
  Administered 2019-07-20: 500 mL

## 2019-07-20 MED ORDER — HEMOSTATIC AGENTS (NO CHARGE) OPTIME
TOPICAL | Status: DC | PRN
  Administered 2019-07-20: 1 via TOPICAL

## 2019-07-20 MED ORDER — PROPOFOL 10 MG/ML IV BOLUS
INTRAVENOUS | Status: DC | PRN
  Administered 2019-07-20: 160 mg via INTRAVENOUS

## 2019-07-20 MED ORDER — METOPROLOL TARTRATE 5 MG/5ML IV SOLN: 2.0000 mg | INTRAVENOUS | Status: DC | PRN

## 2019-07-20 MED ORDER — ESMOLOL HCL 100 MG/10ML IV SOLN
INTRAVENOUS | Status: DC | PRN
  Administered 2019-07-20: 30 mg via INTRAVENOUS

## 2019-07-20 MED ORDER — SUGAMMADEX SODIUM 200 MG/2ML IV SOLN
INTRAVENOUS | Status: DC | PRN
  Administered 2019-07-20: 250 mg via INTRAVENOUS

## 2019-07-20 MED ORDER — DEXAMETHASONE SODIUM PHOSPHATE 10 MG/ML IJ SOLN
INTRAMUSCULAR | Status: AC
  Filled 2019-07-20: qty 1

## 2019-07-20 MED ORDER — PHENOL 1.4 % MT LIQD: 1.0000 | OROMUCOSAL | Status: DC | PRN

## 2019-07-20 MED ORDER — ONDANSETRON HCL 4 MG/2ML IJ SOLN
INTRAMUSCULAR | Status: AC
  Filled 2019-07-20: qty 2

## 2019-07-20 MED ORDER — OXYCODONE HCL 5 MG PO TABS: 5.0000 mg | ORAL_TABLET | Freq: Once | ORAL | Status: DC | PRN

## 2019-07-20 MED ORDER — SODIUM CHLORIDE 0.9 % IV SOLN: 500.0000 mL | Freq: Once | INTRAVENOUS | Status: DC | PRN

## 2019-07-20 MED ORDER — FENTANYL CITRATE (PF) 250 MCG/5ML IJ SOLN
INTRAMUSCULAR | Status: AC
  Filled 2019-07-20: qty 5

## 2019-07-20 MED ORDER — CEFAZOLIN SODIUM-DEXTROSE 2-4 GM/100ML-% IV SOLN
2.0000 g | Freq: Three times a day (TID) | INTRAVENOUS | Status: AC
  Administered 2019-07-20 – 2019-07-21 (×2): 2 g via INTRAVENOUS
  Filled 2019-07-20 (×2): qty 100

## 2019-07-20 MED ORDER — OXYCODONE HCL 5 MG/5ML PO SOLN: 5.0000 mg | Freq: Once | ORAL | Status: DC | PRN

## 2019-07-20 MED ORDER — LIDOCAINE 2% (20 MG/ML) 5 ML SYRINGE
INTRAMUSCULAR | Status: AC
  Filled 2019-07-20: qty 5

## 2019-07-20 MED ORDER — LABETALOL HCL 5 MG/ML IV SOLN
INTRAVENOUS | Status: AC
  Filled 2019-07-20: qty 4

## 2019-07-20 MED ORDER — TELMISARTAN-HCTZ 80-25 MG PO TABS: 1.0000 | ORAL_TABLET | Freq: Every morning | ORAL | Status: DC

## 2019-07-20 MED ORDER — ROCURONIUM BROMIDE 10 MG/ML (PF) SYRINGE
PREFILLED_SYRINGE | INTRAVENOUS | Status: DC | PRN
  Administered 2019-07-20: 20 mg via INTRAVENOUS
  Administered 2019-07-20: 60 mg via INTRAVENOUS

## 2019-07-20 MED ORDER — LABETALOL HCL 5 MG/ML IV SOLN
10.0000 mg | INTRAVENOUS | Status: DC | PRN
  Administered 2019-07-20: 10 mg via INTRAVENOUS

## 2019-07-20 MED ORDER — HYDRALAZINE HCL 25 MG PO TABS
25.0000 mg | ORAL_TABLET | Freq: Three times a day (TID) | ORAL | Status: DC
  Administered 2019-07-20 – 2019-07-21 (×3): 25 mg via ORAL
  Filled 2019-07-20 (×3): qty 1

## 2019-07-20 MED ORDER — LACTATED RINGERS IV SOLN
INTRAVENOUS | Status: DC | PRN
  Administered 2019-07-20 (×2): via INTRAVENOUS

## 2019-07-20 MED ORDER — DEXAMETHASONE SODIUM PHOSPHATE 10 MG/ML IJ SOLN
INTRAMUSCULAR | Status: DC | PRN
  Administered 2019-07-20: 10 mg via INTRAVENOUS

## 2019-07-20 MED ORDER — PHENYLEPHRINE HCL-NACL 10-0.9 MG/250ML-% IV SOLN
INTRAVENOUS | Status: DC | PRN
  Administered 2019-07-20: 25 ug/min via INTRAVENOUS

## 2019-07-20 MED ORDER — HEPARIN SODIUM (PORCINE) 1000 UNIT/ML IJ SOLN
INTRAMUSCULAR | Status: DC | PRN
  Administered 2019-07-20: 8000 [IU] via INTRAVENOUS

## 2019-07-20 MED ORDER — FENTANYL CITRATE (PF) 250 MCG/5ML IJ SOLN
INTRAMUSCULAR | Status: DC | PRN
  Administered 2019-07-20 (×4): 50 ug via INTRAVENOUS

## 2019-07-20 MED ORDER — SODIUM CHLORIDE 0.9 % IV SOLN
INTRAVENOUS | Status: AC
  Filled 2019-07-20: qty 1.2

## 2019-07-20 MED ORDER — MORPHINE SULFATE (PF) 2 MG/ML IV SOLN: 2.0000 mg | INTRAVENOUS | Status: DC | PRN

## 2019-07-20 MED ORDER — ACETAMINOPHEN 325 MG PO TABS
325.0000 mg | ORAL_TABLET | ORAL | Status: DC | PRN
  Administered 2019-07-21: 650 mg via ORAL
  Filled 2019-07-20: qty 2

## 2019-07-20 MED ORDER — CEFAZOLIN SODIUM-DEXTROSE 2-4 GM/100ML-% IV SOLN
2.0000 g | INTRAVENOUS | Status: AC
  Administered 2019-07-20: 2 g via INTRAVENOUS
  Filled 2019-07-20: qty 100

## 2019-07-20 MED ORDER — DOCUSATE SODIUM 100 MG PO CAPS
100.0000 mg | ORAL_CAPSULE | Freq: Every day | ORAL | Status: DC
  Administered 2019-07-21: 100 mg via ORAL
  Filled 2019-07-20: qty 1

## 2019-07-20 MED ORDER — PROTAMINE SULFATE 10 MG/ML IV SOLN
INTRAVENOUS | Status: DC | PRN
  Administered 2019-07-20: 10 mg via INTRAVENOUS
  Administered 2019-07-20: 40 mg via INTRAVENOUS

## 2019-07-20 MED ORDER — CLOPIDOGREL BISULFATE 75 MG PO TABS
75.0000 mg | ORAL_TABLET | Freq: Every day | ORAL | Status: DC
  Administered 2019-07-20: 75 mg via ORAL
  Filled 2019-07-20: qty 1

## 2019-07-20 MED ORDER — ASPIRIN EC 81 MG PO TBEC
81.0000 mg | DELAYED_RELEASE_TABLET | Freq: Every day | ORAL | Status: DC
  Administered 2019-07-21: 81 mg via ORAL
  Filled 2019-07-20: qty 1

## 2019-07-20 MED ORDER — FENTANYL CITRATE (PF) 100 MCG/2ML IJ SOLN: 25.0000 ug | INTRAMUSCULAR | Status: DC | PRN

## 2019-07-20 MED ORDER — SODIUM CHLORIDE 0.9 % IV SOLN: INTRAVENOUS | Status: DC

## 2019-07-20 MED ORDER — HYDRALAZINE HCL 20 MG/ML IJ SOLN: 5.0000 mg | INTRAMUSCULAR | Status: DC | PRN

## 2019-07-20 MED ORDER — IRBESARTAN 300 MG PO TABS
300.0000 mg | ORAL_TABLET | Freq: Every day | ORAL | Status: DC
  Administered 2019-07-21: 300 mg via ORAL
  Filled 2019-07-20: qty 1

## 2019-07-20 MED ORDER — MUPIROCIN 2 % EX OINT
1.0000 "application " | TOPICAL_OINTMENT | Freq: Once | CUTANEOUS | Status: DC
  Filled 2019-07-20: qty 22

## 2019-07-20 MED ORDER — PANTOPRAZOLE SODIUM 40 MG PO TBEC
40.0000 mg | DELAYED_RELEASE_TABLET | Freq: Every day | ORAL | Status: DC
  Administered 2019-07-21: 40 mg via ORAL
  Filled 2019-07-20: qty 1

## 2019-07-20 MED ORDER — EPHEDRINE SULFATE 50 MG/ML IJ SOLN
INTRAMUSCULAR | Status: DC | PRN
  Administered 2019-07-20 (×2): 5 mg via INTRAVENOUS

## 2019-07-20 MED ORDER — 0.9 % SODIUM CHLORIDE (POUR BTL) OPTIME
TOPICAL | Status: DC | PRN
  Administered 2019-07-20: 2000 mL

## 2019-07-20 MED ORDER — LIDOCAINE 2% (20 MG/ML) 5 ML SYRINGE
INTRAMUSCULAR | Status: DC | PRN
  Administered 2019-07-20: 40 mg via INTRAVENOUS

## 2019-07-20 MED ORDER — ALUM & MAG HYDROXIDE-SIMETH 200-200-20 MG/5ML PO SUSP: 15.0000 mL | ORAL | Status: DC | PRN

## 2019-07-20 MED ORDER — MAGNESIUM SULFATE 2 GM/50ML IV SOLN: 2.0000 g | Freq: Every day | INTRAVENOUS | Status: DC | PRN

## 2019-07-20 MED ORDER — SODIUM CHLORIDE 0.9 % IV SOLN
INTRAVENOUS | Status: DC
  Administered 2019-07-20 – 2019-07-21 (×2): via INTRAVENOUS

## 2019-07-20 MED ORDER — OXYCODONE-ACETAMINOPHEN 5-325 MG PO TABS: 1.0000 | ORAL_TABLET | ORAL | Status: DC | PRN

## 2019-07-20 MED ORDER — GUAIFENESIN-DM 100-10 MG/5ML PO SYRP: 15.0000 mL | ORAL_SOLUTION | ORAL | Status: DC | PRN

## 2019-07-20 SURGICAL SUPPLY — 74 items
BAG BANDED W/RUBBER/TAPE 36X54 (MISCELLANEOUS) ×3 IMPLANT
BALLN STERLING RX 5X30X80 (BALLOONS) ×3
BALLOON STERLING RX 5X30X80 (BALLOONS) ×1 IMPLANT
CANISTER SUCT 3000ML PPV (MISCELLANEOUS) ×3 IMPLANT
CATH ROBINSON RED A/P 18FR (CATHETERS) IMPLANT
CLIP VESOCCLUDE MED 6/CT (CLIP) ×3 IMPLANT
CLIP VESOCCLUDE SM WIDE 6/CT (CLIP) ×3 IMPLANT
COVER DOME SNAP 22 D (MISCELLANEOUS) ×3 IMPLANT
COVER PROBE W GEL 5X96 (DRAPES) ×3 IMPLANT
COVER WAND RF STERILE (DRAPES) IMPLANT
DERMABOND ADVANCED (GAUZE/BANDAGES/DRESSINGS) ×4
DERMABOND ADVANCED .7 DNX12 (GAUZE/BANDAGES/DRESSINGS) ×2 IMPLANT
DRAIN CHANNEL 15F RND FF W/TCR (WOUND CARE) IMPLANT
DRAPE INCISE IOBAN 66X45 STRL (DRAPES) IMPLANT
DRSG TEGADERM 2-3/8X2-3/4 SM (GAUZE/BANDAGES/DRESSINGS) ×3 IMPLANT
ELECT REM PT RETURN 9FT ADLT (ELECTROSURGICAL) ×3
ELECTRODE REM PT RTRN 9FT ADLT (ELECTROSURGICAL) ×1 IMPLANT
EVACUATOR SILICONE 100CC (DRAIN) IMPLANT
GAUZE SPONGE 2X2 8PLY STRL LF (GAUZE/BANDAGES/DRESSINGS) ×1 IMPLANT
GLOVE BIO SURGEON STRL SZ7.5 (GLOVE) ×3 IMPLANT
GLOVE BIOGEL PI IND STRL 6.5 (GLOVE) ×2 IMPLANT
GLOVE BIOGEL PI INDICATOR 6.5 (GLOVE) ×4
GOWN STRL REUS W/ TWL LRG LVL3 (GOWN DISPOSABLE) ×1 IMPLANT
GOWN STRL REUS W/ TWL XL LVL3 (GOWN DISPOSABLE) ×2 IMPLANT
GOWN STRL REUS W/TWL LRG LVL3 (GOWN DISPOSABLE) ×2
GOWN STRL REUS W/TWL XL LVL3 (GOWN DISPOSABLE) ×4
HEMOSTAT SNOW SURGICEL 2X4 (HEMOSTASIS) ×3 IMPLANT
INSERT FOGARTY SM (MISCELLANEOUS) IMPLANT
INTRODUCER KIT GALT 7CM (INTRODUCER) ×2
IV ADAPTER SYR DOUBLE MALE LL (MISCELLANEOUS) IMPLANT
KIT BASIN OR (CUSTOM PROCEDURE TRAY) ×3 IMPLANT
KIT ENCORE 26 ADVANTAGE (KITS) ×3 IMPLANT
KIT INTRODUCER GALT 7 (INTRODUCER) ×1 IMPLANT
KIT TURNOVER KIT B (KITS) ×3 IMPLANT
NEEDLE HYPO 25GX1X1/2 BEV (NEEDLE) IMPLANT
NEEDLE PERC 18GX7CM (NEEDLE) ×3 IMPLANT
NEEDLE SPNL 20GX3.5 QUINCKE YW (NEEDLE) IMPLANT
PACK CAROTID (CUSTOM PROCEDURE TRAY) ×3 IMPLANT
PACK UNIVERSAL I (CUSTOM PROCEDURE TRAY) ×3 IMPLANT
PAD ARMBOARD 7.5X6 YLW CONV (MISCELLANEOUS) ×6 IMPLANT
POSITIONER HEAD DONUT 9IN (MISCELLANEOUS) ×3 IMPLANT
PROTECTION STATION PRESSURIZED (MISCELLANEOUS) ×3
SET MICROPUNCTURE 5F STIFF (MISCELLANEOUS) ×3 IMPLANT
SHEATH AVANTI 11CM 5FR (SHEATH) IMPLANT
SHUNT CAROTID BYPASS 10 (VASCULAR PRODUCTS) IMPLANT
SHUNT CAROTID BYPASS 12FRX15.5 (VASCULAR PRODUCTS) IMPLANT
SPONGE GAUZE 2X2 STER 10/PKG (GAUZE/BANDAGES/DRESSINGS) ×2
STATION PROTECTION PRESSURIZED (MISCELLANEOUS) ×1 IMPLANT
STENT TRANSCAROTID SYS 10X30 (Permanent Stent) ×3 IMPLANT
STENT TRANSCAROTID SYSTEM 8X40 (Permanent Stent) ×3 IMPLANT
STOPCOCK 4 WAY LG BORE MALE ST (IV SETS) ×3 IMPLANT
SUT ETHILON 3 0 PS 1 (SUTURE) IMPLANT
SUT MNCRL AB 4-0 PS2 18 (SUTURE) ×3 IMPLANT
SUT PROLENE 5 0 C 1 24 (SUTURE) ×3 IMPLANT
SUT PROLENE 6 0 BV (SUTURE) IMPLANT
SUT PROLENE 7 0 BV 1 (SUTURE) IMPLANT
SUT SILK 2 0 PERMA HAND 18 BK (SUTURE) ×3 IMPLANT
SUT SILK 2 0 SH CR/8 (SUTURE) ×3 IMPLANT
SUT SILK 3 0 (SUTURE)
SUT SILK 3-0 18XBRD TIE 12 (SUTURE) IMPLANT
SUT VIC AB 3-0 SH 27 (SUTURE) ×4
SUT VIC AB 3-0 SH 27X BRD (SUTURE) ×2 IMPLANT
SYR 10ML LL (SYRINGE) ×9 IMPLANT
SYR 20ML LL LF (SYRINGE) ×3 IMPLANT
SYR 5ML LL (SYRINGE) ×3 IMPLANT
SYR CONTROL 10ML LL (SYRINGE) IMPLANT
SYSTEM TRANSCAROTID NEUROPRTCT (MISCELLANEOUS) ×1 IMPLANT
TOWEL GREEN STERILE (TOWEL DISPOSABLE) ×3 IMPLANT
TRANSCAROTID NEUROPROTECT SYS (MISCELLANEOUS) ×3
TUBING ART PRESS 48 MALE/FEM (TUBING) IMPLANT
TUBING EXTENTION W/L.L. (IV SETS) IMPLANT
WATER STERILE IRR 1000ML POUR (IV SOLUTION) ×3 IMPLANT
WIRE AMPLATZ SS-J .035X180CM (WIRE) IMPLANT
WIRE BENTSON .035X145CM (WIRE) ×3 IMPLANT

## 2019-07-20 NOTE — Anesthesia Preprocedure Evaluation (Signed)
Anesthesia Evaluation  Patient identified by MRN, date of birth, ID band Patient awake    Reviewed: Allergy & Precautions, H&P , NPO status , Patient's Chart, lab work & pertinent test results  Airway Mallampati: II   Neck ROM: full    Dental   Pulmonary neg pulmonary ROS,    breath sounds clear to auscultation       Cardiovascular hypertension, + CAD and + Peripheral Vascular Disease   Rhythm:regular Rate:Normal     Neuro/Psych PSYCHIATRIC DISORDERS Depression TIA   GI/Hepatic GERD  ,  Endo/Other  diabetes, Type 2  Renal/GU      Musculoskeletal  (+) Arthritis ,   Abdominal   Peds  Hematology   Anesthesia Other Findings   Reproductive/Obstetrics                             Anesthesia Physical Anesthesia Plan  ASA: III  Anesthesia Plan: General   Post-op Pain Management:    Induction: Intravenous  PONV Risk Score and Plan: 3 and Ondansetron, Dexamethasone, Midazolam and Treatment may vary due to age or medical condition  Airway Management Planned: Oral ETT  Additional Equipment: Arterial line  Intra-op Plan:   Post-operative Plan: Extubation in OR  Informed Consent: I have reviewed the patients History and Physical, chart, labs and discussed the procedure including the risks, benefits and alternatives for the proposed anesthesia with the patient or authorized representative who has indicated his/her understanding and acceptance.       Plan Discussed with: CRNA, Anesthesiologist and Surgeon  Anesthesia Plan Comments:         Anesthesia Quick Evaluation

## 2019-07-20 NOTE — Discharge Instructions (Signed)
   [facility] [place]  Discharge Instructions   Carotid Endarterectomy (CEA)  Please refer to the following instructions for your post-procedure care. Your surgeon or physician assistant will discuss any changes with you.  Activity  You are encouraged to walk as much as you can. You can slowly return to normal activities but must avoid strenuous activity and heavy lifting until your doctor tell you it's OK. Avoid activities such as vacuuming or swinging a golf club. You can drive after one week if you are comfortable and you are no longer taking prescription pain medications. It is normal to feel tired for serval weeks after your surgery. It is also normal to have difficulty with sleep habits, eating, and bowel movements after surgery. These will go away with time.  Bathing/Showering  You may shower after you come home. Do not soak in a bathtub, hot tub, or swim until the incision heals completely.  Incision Care  Shower every day. Clean your incision with mild soap and water. Pat the area dry with a clean towel. You do not need a bandage unless otherwise instructed. Do not apply any ointments or creams to your incision. You may have skin glue on your incision. Do not peel it off. It will come off on its own in about one week. Your incision may feel thickened and raised for several weeks after your surgery. This is normal and the skin will soften over time. For Men Only: It's OK to shave around the incision but do not shave the incision itself for 2 weeks. It is common to have numbness under your chin that could last for several months.  Diet  Resume your normal diet. There are no special food restrictions following this procedure. A low fat/low cholesterol diet is recommended for all patients with vascular disease. In order to heal from your surgery, it is CRITICAL to get adequate nutrition. Your body requires vitamins, minerals, and protein. Vegetables are the best  source of vitamins and minerals. Vegetables also provide the perfect balance of protein. Processed food has little nutritional value, so try to avoid this.        Medications  Resume taking all of your medications unless your doctor or physician assistant tells you not to. If your incision is causing pain, you may take over-the- counter pain relievers such as acetaminophen (Tylenol). If you were prescribed a stronger pain medication, please be aware these medications can cause nausea and constipation. Prevent nausea by taking the medication with a snack or meal. Avoid constipation by drinking plenty of fluids and eating foods with a high amount of fiber, such as fruits, vegetables, and grains. Do not take Tylenol if you are taking prescription pain medications.  Follow Up  Our office will schedule a follow up appointment 2-3 weeks following discharge.  Please call us immediately for any of the following conditions  Increased pain, redness, drainage (pus) from your incision site. Fever of 101 degrees or higher. If you should develop stroke (slurred speech, difficulty swallowing, weakness on one side of your body, loss of vision) you should call 911 and go to the nearest emergency room.  Reduce your risk of vascular disease:  Stop smoking. If you would like help call [facility] at 1-800-QUIT-NOW (1-800-784-8669) or  at 336-586-4000. Manage your cholesterol Maintain a desired weight Control your diabetes Keep your blood pressure down  If you have any questions, please call the office at [phone].   

## 2019-07-20 NOTE — Op Note (Signed)
° ° °  Patient name: Margaret Butler MRN: [id number] DOB: 28-Jul-1941 Sex: female  07/20/2019 Pre-operative Diagnosis: Left carotid restenosis Post-operative diagnosis:  Same Surgeon:  Erlene Quan C. Donzetta Matters, MD Assistant: Arlee Muslim, PA Procedure Performed:  Left transcarotid artery stent with 8 x 4 distal and 10 x 3 proximal ENROUTE stent with flow reversal  Indications: 78 year old female is status post left carotid endarterectomy performed for symptomatic disease with left-sided TIA.  She now has carotid endarterectomy site restenosis less than 1 year.  She is indicated for stenting.  Findings: There is a very tight proximal 90% stenosis of the takeoff of the ICA.  Following stenting initially we did not get the stent back into the common carotid artery.  This was extended proximally with 10 mm stent back.  Completion demonstrated no residual stenosis.  At completion patient was neurologically intact.   Procedure:  The patient was identified in the holding area and taken to the operating room where she is placed by operative table and general anesthesia induced.  She is sterilely prepped and draped in the left neck bilateral groins in usual fashion antibiotics were minister and timeout was called.  The right common femoral vein was cannulated with 18-gauge needle a Bentson wire was placed the wire tract was dilated and an 8 [place] pleural virtual sheath was placed.  In the neck ultrasound was used to identify the common carotid artery.  We made a vertical incision between the 2 heads of sternocleidomastoid dissected down.  IJ was reflected laterally.  Patient was fully heparinized ACT returned greater than 290.  We placed Vesseloops and umbilical tape around the proximal common carotid artery.  A 5-0 Prolene pursestring stitch was then placed.  The common carotid arteries cannulate with micropuncture needle followed by the marked wire and the sheath 3 cm.  Angiography was performed.  IJ Amplatz was placed to  the carotid bifurcation and the flow reversal sheath was placed.  This was sutured to the skin.  Flow reversal was connected to the right common femoral vein and flow was confirmed.  Additional angiography was performed identified our lesion.  TCAR timeout was performed and the common carotid artery was clamped with a vessel loop.  The lesion was crossed with a 1 4 wire predilated with 5 x 3 balloon.  An 8 x 4 stent was in place.  Unfortunately in the proximal aspect of the stent I did not appear to open.  It was postdilated with a 5 balloon.  Completion demonstrated that this was just at the carotid bifurcation.  We then extended this with a 10 x 3 cm stent proximally.  This was not postdilated.  Imaging was performed in 2 views there was no residual stenosis.  Satisfied with this with the wire was removed.  Carotid was unclamped and flow reversal was disconnected.  Sheath was removed and the pursestring was cinched hemostasis obtained.  Patient given 50 mg of protamine.  Right groin sheath was removed pressure held for 10 minutes until hemostasis obtained.  We irrigated the neck incision closed in layers with Vicryl and Monocryl.  Dermabond was placed at the level of the skin.  She was awakened from anesthesia having tolerated procedure well without immediate complication and noted to be neurologically intact on transfer to the recovery room.  All counts were correct at completion.  Contrast: 15 cc.   EBL: 50 cc   Cabrini Ruggieri C. Donzetta Matters, MD [facility] [place] Office: [phone] Pager: [phone]

## 2019-07-20 NOTE — Anesthesia Procedure Notes (Signed)
Procedure Name: Intubation Date/Time: 07/20/2019 8:44 AM Performed by: Bryson Corona, CRNA Pre-anesthesia Checklist: Patient identified, Emergency Drugs available, Suction available and Patient being monitored Patient Re-evaluated:Patient Re-evaluated prior to induction Oxygen Delivery Method: Circle System Utilized Preoxygenation: Pre-oxygenation with 100% oxygen Induction Type: IV induction Ventilation: Mask ventilation without difficulty Laryngoscope Size: Mac and 3 Grade View: Grade I Tube type: Oral Tube size: 7.0 mm Number of attempts: 1 Airway Equipment and Method: Stylet Placement Confirmation: ETT inserted through vocal cords under direct vision,  positive ETCO2 and breath sounds checked- equal and bilateral Secured at: 22 cm Tube secured with: Tape Dental Injury: Teeth and Oropharynx as per pre-operative assessment

## 2019-07-20 NOTE — H&P (Signed)
   History and Physical Update  The patient was interviewed and re-examined.  The patient's previous History and Physical has been reviewed and is unchanged from recent office visit. Plan for left sided tcar today in OR.   Luciana Cammarata C. Donzetta Matters, MD [facility] [place] Office: [phone] Pager: [phone]   07/20/2019, 7:56 AM

## 2019-07-20 NOTE — Transfer of Care (Signed)
Immediate Anesthesia Transfer of Care Note  Patient: Avital Dancy  Procedure(s) Performed: LEFT TRANSCAROTID ARTERY REVASCULARIZATION  with 72mm x 25mm and 60mm x 30 mm stent placement (Left Neck)  Patient Location: PACU  Anesthesia Type:General  Level of Consciousness: awake and patient cooperative  Airway & Oxygen Therapy: Patient Spontanous Breathing and Patient connected to face mask oxygen  Post-op Assessment: Report given to RN, Post -op Vital signs reviewed and stable and Patient moving all extremities X 4  Post vital signs: Reviewed and stable  Last Vitals:  Vitals Value Taken Time  BP 182/80 07/20/19 1015  Temp    Pulse 88 07/20/19 1018  Resp 13 07/20/19 1018  SpO2 100 % 07/20/19 1018  Vitals shown include unvalidated device data.  Last Pain:  Vitals:   07/20/19 0708  PainSc: 0-No pain         Complications: No apparent anesthesia complications

## 2019-07-20 NOTE — Progress Notes (Signed)
Pt received from PACU. Oriented to unit and room. CHG wipes applied. VSS. Will continue to monitor.  Arletta Bale, RN

## 2019-07-20 NOTE — Anesthesia Postprocedure Evaluation (Signed)
Anesthesia Post Note  Patient: Margaret Butler  Procedure(s) Performed: LEFT TRANSCAROTID ARTERY REVASCULARIZATION  with 73mm x 39mm and 81mm x 30 mm stent placement (Left Neck)     Patient location during evaluation: PACU Anesthesia Type: General Level of consciousness: awake and alert Pain management: pain level controlled Vital Signs Assessment: post-procedure vital signs reviewed and stable Respiratory status: spontaneous breathing, nonlabored ventilation, respiratory function stable and patient connected to nasal cannula oxygen Cardiovascular status: blood pressure returned to baseline and stable Postop Assessment: no apparent nausea or vomiting Anesthetic complications: no    Last Vitals:  Vitals:   07/20/19 1430 07/20/19 1500  BP: (!) 159/65 (!) 160/65  Pulse: 82 87  Resp: 18 18  Temp:  36.4 C  SpO2: 94% 92%    Last Pain:  Vitals:   07/20/19 1500  PainSc: 0-No pain    LLE Motor Response: Purposeful movement (07/20/19 1500)   RLE Motor Response: Purposeful movement (07/20/19 1500)        Alazar Cherian S

## 2019-07-21 ENCOUNTER — Encounter: Payer: Self-pay | Admitting: *Deleted

## 2019-07-21 LAB — BASIC METABOLIC PANEL
Anion gap: 9 (ref 5–15)
BUN: 13 mg/dL (ref 8–23)
CO2: 24 mmol/L (ref 22–32)
Calcium: 8.9 mg/dL (ref 8.9–10.3)
Chloride: 106 mmol/L (ref 98–111)
Creatinine, Ser: 0.62 mg/dL (ref 0.44–1.00)
GFR calc Af Amer: >60 mL/min (ref >60)
GFR calc non Af Amer: >60 mL/min (ref >60)
Glucose, Bld: 185 mg/dL — ABNORMAL HIGH (ref 70–99)
Potassium: 3.7 mmol/L (ref 3.5–5.1)
Sodium: 139 mmol/L (ref 135–145)

## 2019-07-21 LAB — CBC
HCT: 36.1 % (ref 36.0–46.0)
Hemoglobin: 11.7 g/dL — ABNORMAL LOW (ref 12.0–15.0)
MCH: 27.5 pg (ref 26.0–34.0)
MCHC: 32.4 g/dL (ref 30.0–36.0)
MCV: 84.7 fL (ref 80.0–100.0)
Platelets: 251 10*3/uL (ref 150–400)
RBC: 4.26 MIL/uL (ref 3.87–5.11)
RDW: 14.2 % (ref 11.5–15.5)
WBC: 15.1 10*3/uL — ABNORMAL HIGH (ref 4.0–10.5)
nRBC: 0 % (ref 0.0–0.2)

## 2019-07-21 LAB — GLUCOSE, CAPILLARY
Glucose-Capillary: 163 mg/dL — ABNORMAL HIGH (ref 70–99)
Glucose-Capillary: 248 mg/dL — ABNORMAL HIGH (ref 70–99)

## 2019-07-21 LAB — HEMOGLOBIN A1C
Hgb A1c MFr Bld: 7.9 % — ABNORMAL HIGH (ref 4.8–5.6)
Mean Plasma Glucose: 180.03 mg/dL

## 2019-07-21 LAB — POCT ACTIVATED CLOTTING TIME: Activated Clotting Time: 296 seconds

## 2019-07-21 MED ORDER — INSULIN ASPART 100 UNIT/ML ~~LOC~~ SOLN
0.0000 [IU] | Freq: Every day | SUBCUTANEOUS | Status: DC
  Administered 2019-07-21: 5 [IU] via SUBCUTANEOUS

## 2019-07-21 MED ORDER — INSULIN ASPART 100 UNIT/ML ~~LOC~~ SOLN
0.0000 [IU] | Freq: Three times a day (TID) | SUBCUTANEOUS | Status: DC
  Administered 2019-07-21: 3 [IU] via SUBCUTANEOUS

## 2019-07-21 NOTE — Progress Notes (Signed)
Discharge instructions given to patient. IV removed, clean and intact. Wound care and medications reviewed. All questions answered. Patient escorted home by husband.   Arletta Bale

## 2019-07-21 NOTE — Progress Notes (Addendum)
  Progress Note    07/21/2019 7:25 AM 1 Day Post-Op  Subjective:  Denies stroke like symptoms   Vitals:   07/21/19 0305 07/21/19 0626  BP: (!) 149/56 (!) 171/63  Pulse: 86 80  Resp: 17 18  Temp: 98.1 F (36.7 C) 98.5 F (36.9 C)  SpO2: 93% 95%   Physical Exam: Lungs:  Non labored Incisions:  L neck incision c/d/i Extremities:  Moving all extremities well; R groin cath site without hematoma Neurologic: CN grossly intact  CBC    Component Value Date/Time   WBC 15.1 (H) 07/21/2019 0500   RBC 4.26 07/21/2019 0500   HGB 11.7 (L) 07/21/2019 0500   HCT 36.1 07/21/2019 0500   PLT 251 07/21/2019 0500   MCV 84.7 07/21/2019 0500   MCH 27.5 07/21/2019 0500   MCHC 32.4 07/21/2019 0500   RDW 14.2 07/21/2019 0500   LYMPHSABS 1.0 02/16/2019 0331   MONOABS 0.8 02/16/2019 0331   EOSABS 0.0 02/16/2019 0331   BASOSABS 0.0 02/16/2019 0331    BMET    Component Value Date/Time   NA 139 07/21/2019 0500   K 3.7 07/21/2019 0500   CL 106 07/21/2019 0500   CO2 24 07/21/2019 0500   GLUCOSE 185 (H) 07/21/2019 0500   BUN 13 07/21/2019 0500   CREATININE 0.62 07/21/2019 0500   CALCIUM 8.9 07/21/2019 0500   GFRNONAA >60 07/21/2019 0500   GFRAA >60 07/21/2019 0500    INR    Component Value Date/Time   INR 0.9 07/20/2019 0727     Intake/Output Summary (Last 24 hours) at 07/21/2019 0725 Last data filed at 07/21/2019 0552 Gross per 24 hour  Intake 1924.1 ml  Output 1150 ml  Net 774.1 ml     Assessment/Plan:  78 y.o. female is s/p L CTAR 1 Day Post-Op   Neuro exam remains at baseline [facility] for discharge home today Follow up in 4 weeks with carotid duplex   Dagoberto Ligas, PA-C [facility] [phone] 07/21/2019 7:25 AM   I have independently interviewed and examined patient and agree with PA assessment and plan above.   Brandon C. Donzetta Matters, MD [facility] [place] Office: [phone] Pager: [phone]

## 2019-07-22 NOTE — Discharge Summary (Signed)
Discharge Summary     Margaret Butler 08-Mar-1941 78 y.o. female  [id number]  Admission Date: 07/20/2019  Discharge Date: 07/21/19  Physician: Dr. Donzetta Matters  Admission Diagnosis: recurrent left carotid stenosis  Discharge Day services:    see progress note 07/21/19 Physical Exam: Vitals:   07/21/19 0626 07/21/19 0842  BP: (!) 171/63 (!) 141/50  Pulse: 80 76  Resp: 18 20  Temp: 98.5 F (36.9 C) (!) 97.4 F (36.3 C)  SpO2: 95% 99%    Hospital Course:  The patient was admitted to the hospital and taken to the operating room on 07/20/2019 and underwent L TCAR.  The pt tolerated the procedure well and was transported to the PACU in good condition.   By POD 1, the pt neuro status remained at baseline  The remainder of the hospital course consisted of increasing mobilization and pain control.  Recent Labs    07/20/19 0727 07/21/19 0500  NA 141 139  K 3.7 3.7  CL 104 106  CO2 26 24  GLUCOSE 169* 185*  BUN 14 13  CALCIUM 8.9 8.9   Recent Labs    07/20/19 0727 07/21/19 0500  WBC 9.0 15.1*  HGB 13.7 11.7*  HCT 41.8 36.1  PLT 246 251   Recent Labs    07/20/19 0727  INR 0.9       Discharge Diagnosis:  recurrent left carotid stenosis  Secondary Diagnosis: Patient Active Problem List   Diagnosis Date Noted  . Carotid artery stenosis 07/20/2019  . TIA (transient ischemic attack) 02/16/2019  . Left carotid stenosis 02/16/2019  . Benign essential HTN 02/16/2019  . DM type 2 (diabetes mellitus, type 2) ([place]) 02/16/2019  . Expressive aphasia 02/12/2019  . Hypertension   . Hypertensive urgency   . Elevated transaminase level   . Hyperlipidemia    Past Medical History:  Diagnosis Date  . Anemia    Low iron in the past  . Arthritis   . Constipation   . Coronary artery disease    stent in 2019  . Depression    "in the past"  . DM type 2 (diabetes mellitus, type 2) ([place]) 02/16/2019  . GERD (gastroesophageal reflux disease)    "years ago"  .  Hyperlipidemia   . Hypertension   . Left carotid stenosis 02/16/2019  . TIA (transient ischemic attack) 02/16/2019    Allergies as of 07/21/2019      Reactions   Atorvastatin Other (See Comments)   Muscle pain   Lisinopril Cough      Sertraline Other (See Comments)   Caused headaches      Medication List    TAKE these medications   aspirin EC 81 MG tablet Take 81 mg by mouth daily.   blood glucose meter kit and supplies Kit Dispense based on patient and insurance preference. Use up to four times daily as directed. (FOR ICD-9 250.00, 250.01).   cetirizine 10 MG tablet Commonly known as: ZYRTEC Take 10 mg by mouth at bedtime as needed for allergies. Notes to patient: Take as you were at home.   clopidogrel 75 MG tablet Commonly known as: PLAVIX Take 1 tablet (75 mg total) by mouth daily at 8 pm.   hydrALAZINE 25 MG tablet Commonly known as: APRESOLINE Take 25 mg by mouth 3 (three) times daily.   Magnesium 250 MG Tabs Take 500 mg by mouth at bedtime. Notes to patient: Take as you were at home.   metFORMIN 500 MG tablet Commonly known as: GLUCOPHAGE Take  500 mg by mouth 2 (two) times daily. Notes to patient: Take as you were at home.   oxymetazoline 0.05 % nasal spray Commonly known as: AFRIN Place 1 spray into both nostrils 2 (two) times daily as needed for congestion. Notes to patient: Take as you were at home.   simvastatin 40 MG tablet Commonly known as: ZOCOR Take 40 mg by mouth at bedtime. Notes to patient: Take as you were at home.   telmisartan-hydrochlorothiazide 80-25 MG tablet Commonly known as: MICARDIS HCT Take 1 tablet by mouth every morning. Notes to patient: Take as you were at home.   TURMERIC PO Take 1,500 mg by mouth daily. Notes to patient: Take as you were at home.   Vitamin D 50 MCG (2000 UT) tablet Take 2,000 Units by mouth daily.        Discharge Instructions:   [facility] Discharge  Instructions Carotid Endarterectomy (CEA)  Please refer to the following instructions for your post-procedure care. Your surgeon or physician assistant will discuss any changes with you.  Activity  You are encouraged to walk as much as you can. You can slowly return to normal activities but must avoid strenuous activity and heavy lifting until your doctor tell you it's OK. Avoid activities such as vacuuming or swinging a golf club. You can drive after one week if you are comfortable and you are no longer taking prescription pain medications. It is normal to feel tired for serval weeks after your surgery. It is also normal to have difficulty with sleep habits, eating, and bowel movements after surgery. These will go away with time.  Bathing/Showering  You may shower after you come home. Do not soak in a bathtub, hot tub, or swim until the incision heals completely.  Incision Care  Shower every day. Clean your incision with mild soap and water. Pat the area dry with a clean towel. You do not need a bandage unless otherwise instructed. Do not apply any ointments or creams to your incision. You may have skin glue on your incision. Do not peel it off. It will come off on its own in about one week. Your incision may feel thickened and raised for several weeks after your surgery. This is normal and the skin will soften over time. For Men Only: It's OK to shave around the incision but do not shave the incision itself for 2 weeks. It is common to have numbness under your chin that could last for several months.  Diet  Resume your normal diet. There are no special food restrictions following this procedure. A low fat/low cholesterol diet is recommended for all patients with vascular disease. In order to heal from your surgery, it is CRITICAL to get adequate nutrition. Your body requires vitamins, minerals, and protein. Vegetables are the best source of vitamins and minerals. Vegetables also provide the  perfect balance of protein. Processed food has little nutritional value, so try to avoid this.  Medications  Resume taking all of your medications unless your doctor or physician assistant tells you not to.  If your incision is causing pain, you may take over-the- counter pain relievers such as acetaminophen (Tylenol). If you were prescribed a stronger pain medication, please be aware these medications can cause nausea and constipation.  Prevent nausea by taking the medication with a snack or meal. Avoid constipation by drinking plenty of fluids and eating foods with a high amount of fiber, such as fruits, vegetables, and grains. Do not  take Tylenol if you are taking prescription pain medications.  Follow Up  Our office will schedule a follow up appointment 2-3 weeks following discharge.  Please call us immediately for any of the following conditions  Increased pain, redness, drainage (pus) from your incision site. Fever of 101 degrees or higher. If you should develop stroke (slurred speech, difficulty swallowing, weakness on one side of your body, loss of vision) you should call 911 and go to the nearest emergency room.  Reduce your risk of vascular disease:  Stop smoking. If you would like help call [facility] at 1-800-QUIT-NOW 973-735-9439) or [place] at [phone]. Manage your cholesterol Maintain a desired weight Control your diabetes Keep your blood pressure down  If you have any questions, please call the office at [phone].  Prescriptions given: Patient refused pain medication at discharge  Disposition: home  Patient's condition: is Good  Follow up: 1. Dr. Donzetta Matters in 4 weeks with carotid duplex.   Dagoberto Ligas, PA-C [facility] [phone]   --- For [facility] Registry use ---   Modified Rankin score at D/C (0-6): 0  IV medication needed for:  1. Hypertension: No 2. Hypotension: No  Post-op Complications: No  1. Post-op CVA or TIA:  No  If yes: Event classification (right eye, left eye, right cortical, left cortical, verterobasilar, other):   If yes: Timing of event (intra-op, <6 hrs post-op, >=6 hrs post-op, unknown):   2. CN injury: No  If yes: CN  injuried   3. Myocardial infarction: No  If yes: Dx by (EKG or clinical, Troponin):   4.  CHF: No  5.  Dysrhythmia (new): No  6. Wound infection: No  7. Reperfusion symptoms: No  8. Return to OR: No  If yes: return to OR for (bleeding, neurologic, other CEA incision, other):   Discharge medications: Statin use:  Yes ASA use:  Yes   Beta blocker use:  No ACE-Inhibitor use:  No  ARB use:  Yes CCB use: No P2Y12 Antagonist use: Yes, [ ]  Plavix, [ ]  Plasugrel, [ ]  Ticlopinine, [ ]  Ticagrelor, [ ]  Other, [ ]  No for medical reason, [ ]  Non-compliant, [ ]  Not-indicated Anti-coagulant use:  No, [ ]  Warfarin, [ ]  Rivaroxaban, [ ]  Dabigatran,

## 2019-08-19 ENCOUNTER — Encounter: Payer: Medicare Other | Admitting: Vascular Surgery

## 2019-08-19 ENCOUNTER — Encounter (HOSPITAL_COMMUNITY): Payer: Medicare Other

## 2019-08-22 ENCOUNTER — Encounter (HOSPITAL_COMMUNITY): Payer: Medicare Other

## 2019-08-22 ENCOUNTER — Ambulatory Visit: Payer: Medicare Other | Admitting: Family

## 2019-09-14 ENCOUNTER — Emergency Department (HOSPITAL_COMMUNITY): Payer: Medicare PPO

## 2019-09-14 ENCOUNTER — Other Ambulatory Visit: Payer: Self-pay

## 2019-09-14 ENCOUNTER — Inpatient Hospital Stay (HOSPITAL_COMMUNITY)
Admission: EM | Admit: 2019-09-14 | Discharge: 2019-09-16 | DRG: 640 | Disposition: A | Discharge status: Home Health | Payer: Medicare PPO | Attending: Internal Medicine | Admitting: Internal Medicine

## 2019-09-14 DIAGNOSIS — I2510 Atherosclerotic heart disease of native coronary artery without angina pectoris: Secondary | ICD-10-CM | POA: Diagnosis present

## 2019-09-14 DIAGNOSIS — Z888 Allergy status to other drugs, medicaments and biological substances status: Secondary | ICD-10-CM

## 2019-09-14 DIAGNOSIS — Z7982 Long term (current) use of aspirin: Secondary | ICD-10-CM

## 2019-09-14 DIAGNOSIS — E871 Hypo-osmolality and hyponatremia: Secondary | ICD-10-CM | POA: Diagnosis not present

## 2019-09-14 DIAGNOSIS — I160 Hypertensive urgency: Secondary | ICD-10-CM | POA: Diagnosis present

## 2019-09-14 DIAGNOSIS — I6523 Occlusion and stenosis of bilateral carotid arteries: Secondary | ICD-10-CM | POA: Diagnosis present

## 2019-09-14 DIAGNOSIS — E876 Hypokalemia: Secondary | ICD-10-CM | POA: Diagnosis present

## 2019-09-14 DIAGNOSIS — Z95828 Presence of other vascular implants and grafts: Secondary | ICD-10-CM

## 2019-09-14 DIAGNOSIS — Z8616 Personal history of COVID-19: Secondary | ICD-10-CM

## 2019-09-14 DIAGNOSIS — Z955 Presence of coronary angioplasty implant and graft: Secondary | ICD-10-CM

## 2019-09-14 DIAGNOSIS — G9341 Metabolic encephalopathy: Secondary | ICD-10-CM | POA: Diagnosis present

## 2019-09-14 DIAGNOSIS — E785 Hyperlipidemia, unspecified: Secondary | ICD-10-CM | POA: Diagnosis present

## 2019-09-14 DIAGNOSIS — R4182 Altered mental status, unspecified: Secondary | ICD-10-CM | POA: Diagnosis not present

## 2019-09-14 DIAGNOSIS — I10 Essential (primary) hypertension: Secondary | ICD-10-CM | POA: Diagnosis present

## 2019-09-14 DIAGNOSIS — R4701 Aphasia: Secondary | ICD-10-CM | POA: Diagnosis present

## 2019-09-14 DIAGNOSIS — Z7984 Long term (current) use of oral hypoglycemic drugs: Secondary | ICD-10-CM

## 2019-09-14 DIAGNOSIS — Z90710 Acquired absence of both cervix and uterus: Secondary | ICD-10-CM

## 2019-09-14 DIAGNOSIS — Z7902 Long term (current) use of antithrombotics/antiplatelets: Secondary | ICD-10-CM

## 2019-09-14 DIAGNOSIS — Z8673 Personal history of transient ischemic attack (TIA), and cerebral infarction without residual deficits: Secondary | ICD-10-CM

## 2019-09-14 DIAGNOSIS — Z79899 Other long term (current) drug therapy: Secondary | ICD-10-CM

## 2019-09-14 DIAGNOSIS — Z8249 Family history of ischemic heart disease and other diseases of the circulatory system: Secondary | ICD-10-CM

## 2019-09-14 DIAGNOSIS — E119 Type 2 diabetes mellitus without complications: Secondary | ICD-10-CM

## 2019-09-14 DIAGNOSIS — G9340 Encephalopathy, unspecified: Secondary | ICD-10-CM

## 2019-09-14 DIAGNOSIS — I651 Occlusion and stenosis of basilar artery: Secondary | ICD-10-CM | POA: Diagnosis present

## 2019-09-14 DIAGNOSIS — I6601 Occlusion and stenosis of right middle cerebral artery: Secondary | ICD-10-CM | POA: Diagnosis present

## 2019-09-14 DIAGNOSIS — Z96651 Presence of right artificial knee joint: Secondary | ICD-10-CM | POA: Diagnosis present

## 2019-09-14 HISTORY — DX: Cerebral infarction, unspecified: I63.9

## 2019-09-14 LAB — I-STAT CHEM 8, ED
BUN: 10 mg/dL (ref 8–23)
Calcium, Ion: 1.05 mmol/L — ABNORMAL LOW (ref 1.15–1.40)
Chloride: 80 mmol/L — ABNORMAL LOW (ref 98–111)
Creatinine, Ser: 0.4 mg/dL — ABNORMAL LOW (ref 0.44–1.00)
Glucose, Bld: 160 mg/dL — ABNORMAL HIGH (ref 70–99)
HCT: 40 % (ref 36.0–46.0)
Hemoglobin: 13.6 g/dL (ref 12.0–15.0)
Potassium: 2.9 mmol/L — ABNORMAL LOW (ref 3.5–5.1)
Sodium: 118 mmol/L — CL (ref 135–145)
TCO2: 26 mmol/L (ref 22–32)

## 2019-09-14 LAB — COMPREHENSIVE METABOLIC PANEL
ALT: 120 U/L — ABNORMAL HIGH (ref 0–44)
AST: 81 U/L — ABNORMAL HIGH (ref 15–41)
Albumin: 3.9 g/dL (ref 3.5–5.0)
Alkaline Phosphatase: 50 U/L (ref 38–126)
Anion gap: 17 — ABNORMAL HIGH (ref 5–15)
BUN: 10 mg/dL (ref 8–23)
CO2: 24 mmol/L (ref 22–32)
Calcium: 9.4 mg/dL (ref 8.9–10.3)
Chloride: 79 mmol/L — ABNORMAL LOW (ref 98–111)
Creatinine, Ser: 0.49 mg/dL (ref 0.44–1.00)
GFR calc Af Amer: >60 mL/min (ref >60)
GFR calc non Af Amer: >60 mL/min (ref >60)
Glucose, Bld: 157 mg/dL — ABNORMAL HIGH (ref 70–99)
Potassium: 2.9 mmol/L — ABNORMAL LOW (ref 3.5–5.1)
Sodium: 120 mmol/L — ABNORMAL LOW (ref 135–145)
Total Bilirubin: 1.2 mg/dL (ref 0.3–1.2)
Total Protein: 7.2 g/dL (ref 6.5–8.1)

## 2019-09-14 LAB — URINALYSIS, ROUTINE W REFLEX MICROSCOPIC
Bacteria, UA: NONE SEEN
Bilirubin Urine: NEGATIVE
Glucose, UA: 50 mg/dL — AB
Ketones, ur: 20 mg/dL — AB
Nitrite: NEGATIVE
Protein, ur: NEGATIVE mg/dL
Specific Gravity, Urine: 1.019 (ref 1.005–1.030)
pH: 8 (ref 5.0–8.0)

## 2019-09-14 LAB — DIFFERENTIAL
Abs Immature Granulocytes: 0.08 10*3/uL — ABNORMAL HIGH (ref 0.00–0.07)
Basophils Absolute: 0.1 10*3/uL (ref 0.0–0.1)
Basophils Relative: 1 %
Eosinophils Absolute: 0.1 10*3/uL (ref 0.0–0.5)
Eosinophils Relative: 1 %
Immature Granulocytes: 1 %
Lymphocytes Relative: 15 %
Lymphs Abs: 2 10*3/uL (ref 0.7–4.0)
Monocytes Absolute: 1.2 10*3/uL — ABNORMAL HIGH (ref 0.1–1.0)
Monocytes Relative: 9 %
Neutro Abs: 9.9 10*3/uL — ABNORMAL HIGH (ref 1.7–7.7)
Neutrophils Relative %: 73 %

## 2019-09-14 LAB — CBC
HCT: 37.1 % (ref 36.0–46.0)
Hemoglobin: 13.2 g/dL (ref 12.0–15.0)
MCH: 28.3 pg (ref 26.0–34.0)
MCHC: 35.6 g/dL (ref 30.0–36.0)
MCV: 79.6 fL — ABNORMAL LOW (ref 80.0–100.0)
Platelets: 280 10*3/uL (ref 150–400)
RBC: 4.66 MIL/uL (ref 3.87–5.11)
RDW: 13.9 % (ref 11.5–15.5)
WBC: 13.3 10*3/uL — ABNORMAL HIGH (ref 4.0–10.5)
nRBC: 0 % (ref 0.0–0.2)

## 2019-09-14 LAB — APTT: aPTT: 29 seconds (ref 24–36)

## 2019-09-14 LAB — PROTIME-INR
INR: 1 (ref 0.8–1.2)
Prothrombin Time: 12.6 seconds (ref 11.4–15.2)

## 2019-09-14 LAB — ETHANOL: Alcohol, Ethyl (B): <10 mg/dL (ref <10)

## 2019-09-14 MED ORDER — LABETALOL HCL 5 MG/ML IV SOLN
10.0000 mg | Freq: Once | INTRAVENOUS | Status: AC
  Administered 2019-09-14: 10 mg via INTRAVENOUS
  Filled 2019-09-14: qty 4

## 2019-09-14 MED ORDER — POTASSIUM CHLORIDE 10 MEQ/100ML IV SOLN
10.0000 meq | INTRAVENOUS | Status: AC
  Administered 2019-09-14 – 2019-09-15 (×3): 10 meq via INTRAVENOUS
  Filled 2019-09-14 (×3): qty 100

## 2019-09-14 MED ORDER — SIMVASTATIN 20 MG PO TABS: 40.0000 mg | ORAL_TABLET | Freq: Every day | ORAL | Status: DC

## 2019-09-14 MED ORDER — INSULIN ASPART 100 UNIT/ML ~~LOC~~ SOLN: 0.0000 [IU] | SUBCUTANEOUS | Status: DC

## 2019-09-14 MED ORDER — SODIUM CHLORIDE 0.9 % IV SOLN: Freq: Once | INTRAVENOUS | Status: AC

## 2019-09-14 MED ORDER — IOHEXOL 350 MG/ML SOLN
100.0000 mL | Freq: Once | INTRAVENOUS | Status: AC | PRN
  Administered 2019-09-14: 100 mL via INTRAVENOUS

## 2019-09-14 MED ORDER — ASPIRIN EC 81 MG PO TBEC
81.0000 mg | DELAYED_RELEASE_TABLET | Freq: Every day | ORAL | Status: DC
  Administered 2019-09-15 – 2019-09-16 (×2): 81 mg via ORAL
  Filled 2019-09-14 (×2): qty 1

## 2019-09-14 MED ORDER — CLOPIDOGREL BISULFATE 75 MG PO TABS
75.0000 mg | ORAL_TABLET | Freq: Every day | ORAL | Status: DC
  Administered 2019-09-15 (×2): 75 mg via ORAL
  Filled 2019-09-14 (×2): qty 1

## 2019-09-14 MED ORDER — LABETALOL HCL 5 MG/ML IV SOLN: 10.0000 mg | INTRAVENOUS | Status: DC | PRN

## 2019-09-14 MED ORDER — SODIUM CHLORIDE 0.9 % IV BOLUS
500.0000 mL | Freq: Once | INTRAVENOUS | Status: AC
  Administered 2019-09-14: 500 mL via INTRAVENOUS

## 2019-09-14 MED ORDER — HYDRALAZINE HCL 25 MG PO TABS
25.0000 mg | ORAL_TABLET | Freq: Three times a day (TID) | ORAL | Status: DC
  Administered 2019-09-15 – 2019-09-16 (×4): 25 mg via ORAL
  Filled 2019-09-14 (×4): qty 1

## 2019-09-14 MED ORDER — CLEVIDIPINE BUTYRATE 0.5 MG/ML IV EMUL
INTRAVENOUS | Status: AC
  Filled 2019-09-14: qty 50

## 2019-09-14 NOTE — ED Provider Notes (Signed)
[facility] EMERGENCY DEPARTMENT Provider Note   CSN: [id number] Arrival date & time: 09/14/19  2152  An emergency department physician performed an initial assessment on this suspected stroke patient at 2154.  History Chief Complaint  Patient presents with  . Code Stroke    LEVEL 5 CAVEAT 2/2 ACUITY OF CONDITION  Vonette Grosso is a 79 y.o. female.   79 y/o female with hx of HTN, HLD, DM, CAD, L carotid stenosis s/p stenting (on ASA/Plavix), TIA presents to the ED as a "code stroke" secondary to aphasia. Most of the history obtained from the Neurologist who assessed the patient on arrival. Patient apparently was not feeling well with lack of appetite and nausea over the past few days. 2 days ago woke from a nap with some confusion which resolved spontaneously. Today, went to bed at 1930 and woke up at 2000 with slurred speech and confusion. Presented to the ED with concern for aphasia. Noted to be hypertensive as well.       Past Medical History:  Diagnosis Date  . Anemia    Low iron in the past  . Arthritis   . Constipation   . Coronary artery disease    stent in 2019  . Depression    "in the past"  . DM type 2 (diabetes mellitus, type 2) ([place]) 02/16/2019  . GERD (gastroesophageal reflux disease)    "years ago"  . Hyperlipidemia   . Hypertension   . Left carotid stenosis 02/16/2019  . TIA (transient ischemic attack) 02/16/2019    Patient Active Problem List   Diagnosis Date Noted  . Hypokalemia 09/15/2019  . Acute metabolic encephalopathy 06/04/8526  . Hyponatremia 09/14/2019  . Carotid artery stenosis 07/20/2019  . TIA (transient ischemic attack) 02/16/2019  . Left carotid stenosis 02/16/2019  . Benign essential HTN 02/16/2019  . DM type 2 (diabetes mellitus, type 2) ([place]) 02/16/2019  . Expressive aphasia 02/12/2019  . Hypertension   . Hypertensive urgency   . Elevated transaminase level   . Hyperlipidemia     Past Surgical History:   Procedure Laterality Date  . ABDOMINAL HYSTERECTOMY    . CORONARY ANGIOPLASTY    . ENDARTERECTOMY Left 02/15/2019   Procedure: ENDARTERECTOMY CAROTID;  Surgeon: [name], MD;  Location: [facility];  Service: Vascular;  Laterality: Left;  . JOINT REPLACEMENT Right    knee  . PATCH ANGIOPLASTY Left 02/15/2019   Procedure: Patch Angioplasty Using Hemoshield patch size 0.8cm x 7.6cm;  Surgeon: [name], MD;  Location: [facility];  Service: Vascular;  Laterality: Left;  . TRANSCAROTID ARTERY REVASCULARIZATION Left 07/20/2019   Procedure: LEFT TRANSCAROTID ARTERY REVASCULARIZATION  with 25m x 463mand 101m 30 mm stent placement;  Surgeon: CaiWaynetta SandyD;  Location: [facility];  Service: Vascular;  Laterality: Left;     OB History   No obstetric history on file.     Family History  Problem Relation Age of Onset  . Heart attack Father     Social History   Tobacco Use  . Smoking status: Never Smoker  . Smokeless tobacco: Never Used  Substance Use Topics  . Alcohol use: Never  . Drug use: Never    Home Medications Prior to Admission medications   Medication Sig Start Date End Date Taking? Authorizing Provider  aspirin EC 81 MG tablet Take 81 mg by mouth daily.    [provider]  blood glucose meter kit and supplies KIT Dispense based on patient  and insurance preference. Use up to four times daily as directed. (FOR ICD-9 250.00, 250.01). 02/16/19   Samuella Cota, MD  cetirizine (ZYRTEC) 10 MG tablet Take 10 mg by mouth at bedtime as needed for allergies.    [provider]  Cholecalciferol (VITAMIN D) 50 MCG (2000 UT) tablet Take 2,000 Units by mouth daily.    [provider]  clopidogrel (PLAVIX) 75 MG tablet Take 1 tablet (75 mg total) by mouth daily at 8 pm. 07/15/19   [name], MD  hydrALAZINE (APRESOLINE) 25 MG tablet Take 25 mg by mouth 3 (three) times daily.    [provider]  Magnesium  250 MG TABS Take 500 mg by mouth at bedtime.     [provider]  metFORMIN (GLUCOPHAGE) 500 MG tablet Take 500 mg by mouth 2 (two) times daily.    [provider]  oxymetazoline (AFRIN) 0.05 % nasal spray Place 1 spray into both nostrils 2 (two) times daily as needed for congestion.    [provider]  simvastatin (ZOCOR) 40 MG tablet Take 40 mg by mouth at bedtime.    [provider]  telmisartan-hydrochlorothiazide (MICARDIS HCT) 80-25 MG tablet Take 1 tablet by mouth every morning.  11/13/18   [provider]  TURMERIC PO Take 1,500 mg by mouth daily.     [provider]    Allergies    Atorvastatin, Lisinopril, and Sertraline  Review of Systems   Review of Systems  Unable to perform ROS: Acuity of condition    Physical Exam Updated Vital Signs BP (!) 147/89   Pulse 80   Temp 98.3 F (36.8 C) (Oral)   Resp (!) 21   Ht 5' 3"  (1.6 m)   Wt 66.6 kg   SpO2 98%   BMI 26.01 kg/m   Physical Exam Vitals and nursing note reviewed.  Constitutional:      General: She is not in acute distress.    Appearance: She is well-developed. She is not diaphoretic.     Comments: Nontoxic appearing, pleasant.  HENT:     Head: Normocephalic and atraumatic.     Mouth/Throat:     Comments: Symmetric rise of the uvula with phonation. Eyes:     General: No scleral icterus.    Extraocular Movements: Extraocular movements intact.     Conjunctiva/sclera: Conjunctivae normal.     Pupils: Pupils are equal, round, and reactive to light.  Cardiovascular:     Rate and Rhythm: Normal rate and regular rhythm.     Pulses: Normal pulses.  Pulmonary:     Effort: Pulmonary effort is normal. No respiratory distress.     Comments: Respirations even and unlabored Musculoskeletal:        General: Normal range of motion.     Cervical back: Normal range of motion.  Skin:    General: Skin is warm and dry.     Coloration: Skin is not pale.     Findings: No  erythema or rash.  Neurological:     Mental Status: She is alert and oriented to person, place, and time.     Comments: Patient alert. States she is at "[facility]" hospital and the year is "1921". Is able to tell me that the President is Biden. No cranial nerve deficits appreciated; symmetric eyebrow raise, no facial drooping, tongue midline. Patient has equal grip strength bilaterally with 5/5 strength against resistance in all major muscle groups bilaterally. Sensation to light touch intact. Ambulation not  assessed.  Psychiatric:        Behavior: Behavior normal.     ED Results / Procedures / Treatments   Labs (all labs ordered are listed, but only abnormal results are displayed) Labs Reviewed  CBC - Abnormal; Notable for the following components:      Result Value   WBC 13.3 (*)    MCV 79.6 (*)    All other components within normal limits  DIFFERENTIAL - Abnormal; Notable for the following components:   Neutro Abs 9.9 (*)    Monocytes Absolute 1.2 (*)    Abs Immature Granulocytes 0.08 (*)    All other components within normal limits  COMPREHENSIVE METABOLIC PANEL - Abnormal; Notable for the following components:   Sodium 120 (*)    Potassium 2.9 (*)    Chloride 79 (*)    Glucose, Bld 157 (*)    AST 81 (*)    ALT 120 (*)    Anion gap 17 (*)    All other components within normal limits  URINALYSIS, ROUTINE W REFLEX MICROSCOPIC - Abnormal; Notable for the following components:   Color, Urine STRAW (*)    Glucose, UA 50 (*)    Hgb urine dipstick SMALL (*)    Ketones, ur 20 (*)    Leukocytes,Ua SMALL (*)    All other components within normal limits  OSMOLALITY, URINE - Abnormal; Notable for the following components:   Osmolality, Ur 291 (*)    All other components within normal limits  I-STAT CHEM 8, ED - Abnormal; Notable for the following components:   Sodium 118 (*)    Potassium 2.9 (*)    Chloride 80 (*)    Creatinine, Ser 0.40 (*)    Glucose, Bld 160 (*)     Calcium, Ion 1.05 (*)    All other components within normal limits  CBG MONITORING, ED - Abnormal; Notable for the following components:   Glucose-Capillary 160 (*)    All other components within normal limits  SARS CORONAVIRUS 2 (TAT 6-24 HRS)  ETHANOL  PROTIME-INR  APTT  RAPID URINE DRUG SCREEN, HOSP PERFORMED  SODIUM, URINE, RANDOM  BASIC METABOLIC PANEL  BASIC METABOLIC PANEL  BASIC METABOLIC PANEL  BASIC METABOLIC PANEL  TSH    EKG EKG Interpretation  Date/Time:  Wednesday September 14 2019 23:20:42 EST Ventricular Rate:  80 PR Interval:    QRS Duration: 110 QT Interval:  395 QTC Calculation: 456 R Axis:   86 Text Interpretation: Sinus rhythm Probable left atrial enlargement Borderline right axis deviation LVH with IVCD and secondary repol abnrm ST depr, consider ischemia, inferior leads Confirmed by Virgel Manifold [phone]) on 09/14/2019 11:31:42 PM   Radiology CT Code Stroke CTA Head W/WO contrast  Result Date: 09/14/2019 CLINICAL DATA:  Code stroke.  Pure aphasia. EXAM: CT HEAD WITHOUT CONTRAST CT ANGIOGRAPHY OF THE HEAD AND NECK TECHNIQUE: Contiguous axial images were obtained from the base of the skull through the vertex without intravenous contrast. Multidetector CT imaging of the head and neck was performed using the standard protocol during bolus administration of intravenous contrast. Multiplanar CT image reconstructions and MIPs were obtained to evaluate the vascular anatomy. Carotid stenosis measurements (when applicable) are obtained utilizing NASCET criteria, using the distal internal carotid diameter as the denominator. COMPARISON:  07/11/2019 FINDINGS: CT HEAD FINDINGS Brain: There is no mass, hemorrhage or extra-axial collection. There is hypoattenuation of the periventricular white matter, most commonly indicating chronic ischemic microangiopathy. Vascular: No abnormal hyperdensity of the major intracranial  arteries or dural venous sinuses. No intracranial  atherosclerosis. Skull: The visualized skull base, calvarium and extracranial soft tissues are normal. Sinuses/Orbits: No fluid levels or advanced mucosal thickening of the visualized paranasal sinuses. No mastoid or middle ear effusion. The orbits are normal. ASPECTS ([place] Stroke Program Early CT Score) - Ganglionic level infarction (caudate, lentiform nuclei, internal capsule, insula, M1-M3 cortex): 7 - Supraganglionic infarction (M4-M6 cortex): 3 Total score (0-10 with 10 being normal): 10 CTA NECK FINDINGS SKELETON: There is no bony spinal canal stenosis. No lytic or blastic lesion. OTHER NECK: Normal pharynx, larynx and major salivary glands. No cervical lymphadenopathy. Unremarkable thyroid gland. UPPER CHEST: No pneumothorax or pleural effusion. No nodules or masses. AORTIC ARCH: There is mild calcific atherosclerosis of the aortic arch. There is no aneurysm, dissection or hemodynamically significant stenosis of the visualized portion of the aorta. Conventional 3 vessel aortic branching pattern. The visualized proximal subclavian arteries are widely patent. RIGHT CAROTID SYSTEM: No dissection, occlusion or aneurysm. There is mixed density atherosclerosis extending into the proximal ICA, resulting in 54% stenosis. LEFT CAROTID SYSTEM: Interval left carotid endarterectomy, widely patent. VERTEBRAL ARTERIES: Left dominant configuration. Both origins are clearly patent. Moderate multifocal atherosclerosis of the left V3 segment, unchanged. CTA HEAD FINDINGS POSTERIOR CIRCULATION: --Vertebral arteries: Atherosclerotic calcification of both V4 segments without high-grade stenosis. --Posterior inferior cerebellar arteries (PICA): Patent origins from the vertebral arteries. --Anterior inferior cerebellar arteries (AICA): Patent origins from the basilar artery. --Basilar artery: Short segment occlusion or high-grade stenosis of the basilar artery is unchanged compared to 07/11/2019. --Superior cerebellar arteries:  Normal. --Posterior cerebral arteries: Normal. The left PCA is partially supplied by a posterior communicating artery (p-comm). Right P-comm is diminutive or absent. ANTERIOR CIRCULATION: --Intracranial internal carotid arteries: Unchanged bilateral atherosclerotic calcification of both internal carotid arteries at the skull base. Moderate stenosis is unchanged. --Anterior cerebral arteries (ACA): Normal. Hypoplastic right A1 segment, normal variant. --Middle cerebral arteries (MCA): Severe stenosis of the proximal right M1 segment is unchanged. There is mild narrowing of the distal left M1 segment. Distal branches are patent. VENOUS SINUSES: As permitted by contrast timing, patent. ANATOMIC VARIANTS: None Review of the MIP images confirms the above findings. IMPRESSION: 1. No emergent large vessel occlusion. 2. 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 3. 54% stenosis of the proximal right internal carotid artery, unchanged. 4. Status post left carotid endarterectomy/stent, widely patent. 5. Unchanged severe mid basilar stenosis. These results were called by telephone at the time of interpretation on 09/14/2019 at 10:33 pm to provider MCNEILL [facility] , who verbally acknowledged these results. Electronically Signed   By: Ulyses Jarred M.D.   On: 09/14/2019 22:47   CT Code Stroke CTA Neck W/WO contrast  Result Date: 09/14/2019 CLINICAL DATA:  Code stroke.  Pure aphasia. EXAM: CT HEAD WITHOUT CONTRAST CT ANGIOGRAPHY OF THE HEAD AND NECK TECHNIQUE: Contiguous axial images were obtained from the base of the skull through the vertex without intravenous contrast. Multidetector CT imaging of the head and neck was performed using the standard protocol during bolus administration of intravenous contrast. Multiplanar CT image reconstructions and MIPs were obtained to evaluate the vascular anatomy. Carotid stenosis measurements (when applicable) are obtained utilizing NASCET criteria, using the distal  internal carotid diameter as the denominator. COMPARISON:  07/11/2019 FINDINGS: CT HEAD FINDINGS Brain: There is no mass, hemorrhage or extra-axial collection. There is hypoattenuation of the periventricular white matter, most commonly indicating chronic ischemic microangiopathy. Vascular: No abnormal hyperdensity of the major intracranial  arteries or dural venous sinuses. No intracranial atherosclerosis. Skull: The visualized skull base, calvarium and extracranial soft tissues are normal. Sinuses/Orbits: No fluid levels or advanced mucosal thickening of the visualized paranasal sinuses. No mastoid or middle ear effusion. The orbits are normal. ASPECTS ([place] Stroke Program Early CT Score) - Ganglionic level infarction (caudate, lentiform nuclei, internal capsule, insula, M1-M3 cortex): 7 - Supraganglionic infarction (M4-M6 cortex): 3 Total score (0-10 with 10 being normal): 10 CTA NECK FINDINGS SKELETON: There is no bony spinal canal stenosis. No lytic or blastic lesion. OTHER NECK: Normal pharynx, larynx and major salivary glands. No cervical lymphadenopathy. Unremarkable thyroid gland. UPPER CHEST: No pneumothorax or pleural effusion. No nodules or masses. AORTIC ARCH: There is mild calcific atherosclerosis of the aortic arch. There is no aneurysm, dissection or hemodynamically significant stenosis of the visualized portion of the aorta. Conventional 3 vessel aortic branching pattern. The visualized proximal subclavian arteries are widely patent. RIGHT CAROTID SYSTEM: No dissection, occlusion or aneurysm. There is mixed density atherosclerosis extending into the proximal ICA, resulting in 54% stenosis. LEFT CAROTID SYSTEM: Interval left carotid endarterectomy, widely patent. VERTEBRAL ARTERIES: Left dominant configuration. Both origins are clearly patent. Moderate multifocal atherosclerosis of the left V3 segment, unchanged. CTA HEAD FINDINGS POSTERIOR CIRCULATION: --Vertebral arteries: Atherosclerotic  calcification of both V4 segments without high-grade stenosis. --Posterior inferior cerebellar arteries (PICA): Patent origins from the vertebral arteries. --Anterior inferior cerebellar arteries (AICA): Patent origins from the basilar artery. --Basilar artery: Short segment occlusion or high-grade stenosis of the basilar artery is unchanged compared to 07/11/2019. --Superior cerebellar arteries: Normal. --Posterior cerebral arteries: Normal. The left PCA is partially supplied by a posterior communicating artery (p-comm). Right P-comm is diminutive or absent. ANTERIOR CIRCULATION: --Intracranial internal carotid arteries: Unchanged bilateral atherosclerotic calcification of both internal carotid arteries at the skull base. Moderate stenosis is unchanged. --Anterior cerebral arteries (ACA): Normal. Hypoplastic right A1 segment, normal variant. --Middle cerebral arteries (MCA): Severe stenosis of the proximal right M1 segment is unchanged. There is mild narrowing of the distal left M1 segment. Distal branches are patent. VENOUS SINUSES: As permitted by contrast timing, patent. ANATOMIC VARIANTS: None Review of the MIP images confirms the above findings. IMPRESSION: 1. No emergent large vessel occlusion. 2. 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 3. 54% stenosis of the proximal right internal carotid artery, unchanged. 4. Status post left carotid endarterectomy/stent, widely patent. 5. Unchanged severe mid basilar stenosis. These results were called by telephone at the time of interpretation on 09/14/2019 at 10:33 pm to provider MCNEILL [facility] , who verbally acknowledged these results. Electronically Signed   By: Ulyses Jarred M.D.   On: 09/14/2019 22:47   MR BRAIN WO CONTRAST  Result Date: 09/14/2019 CLINICAL DATA:  Stroke follow-up EXAM: MRI HEAD WITHOUT CONTRAST TECHNIQUE: Multiplanar, multiecho pulse sequences of the brain and surrounding structures were obtained without intravenous  contrast. COMPARISON:  02/13/2019 brain MRI FINDINGS: BRAIN: No acute infarct, acute hemorrhage or extra-axial collection. Early confluent hyperintense T2-weighted signal of the periventricular and deep white matter, most commonly due to chronic ischemic microangiopathy. Normal volume of brain parenchyma and CSF spaces. Midline structures are normal. VASCULAR: Major flow voids are preserved. Susceptibility-sensitive sequences show no chronic microhemorrhage or superficial siderosis. SKULL AND UPPER CERVICAL SPINE: Normal calvarium and skull base. Visualized upper cervical spine and soft tissues are normal. SINUSES/ORBITS: No fluid levels or advanced mucosal thickening. No mastoid or middle ear effusion. Normal orbits. Other: None. IMPRESSION: 1. No acute intracranial abnormality. 2. Findings  of chronic small vessel ischemia. Electronically Signed   By: Ulyses Jarred M.D.   On: 09/14/2019 23:25   DG CHEST PORT 1 VIEW  Result Date: 09/15/2019 CLINICAL DATA:  History of COVID-19 positivity, recent confusion and aphasia EXAM: PORTABLE CHEST 1 VIEW COMPARISON:  09/07/2018 FINDINGS: Cardiac shadow is enlarged. Aortic calcifications are seen. No focal infiltrate is noted. No acute bony abnormality is seen. IMPRESSION: No active disease. Electronically Signed   By: Inez Catalina M.D.   On: 09/15/2019 00:51   CT HEAD CODE STROKE WO CONTRAST  Result Date: 09/14/2019 CLINICAL DATA:  Code stroke.  Pure aphasia. EXAM: CT HEAD WITHOUT CONTRAST CT ANGIOGRAPHY OF THE HEAD AND NECK TECHNIQUE: Contiguous axial images were obtained from the base of the skull through the vertex without intravenous contrast. Multidetector CT imaging of the head and neck was performed using the standard protocol during bolus administration of intravenous contrast. Multiplanar CT image reconstructions and MIPs were obtained to evaluate the vascular anatomy. Carotid stenosis measurements (when applicable) are obtained utilizing NASCET criteria, using  the distal internal carotid diameter as the denominator. COMPARISON:  07/11/2019 FINDINGS: CT HEAD FINDINGS Brain: There is no mass, hemorrhage or extra-axial collection. There is hypoattenuation of the periventricular white matter, most commonly indicating chronic ischemic microangiopathy. Vascular: No abnormal hyperdensity of the major intracranial arteries or dural venous sinuses. No intracranial atherosclerosis. Skull: The visualized skull base, calvarium and extracranial soft tissues are normal. Sinuses/Orbits: No fluid levels or advanced mucosal thickening of the visualized paranasal sinuses. No mastoid or middle ear effusion. The orbits are normal. ASPECTS ([place] Stroke Program Early CT Score) - Ganglionic level infarction (caudate, lentiform nuclei, internal capsule, insula, M1-M3 cortex): 7 - Supraganglionic infarction (M4-M6 cortex): 3 Total score (0-10 with 10 being normal): 10 CTA NECK FINDINGS SKELETON: There is no bony spinal canal stenosis. No lytic or blastic lesion. OTHER NECK: Normal pharynx, larynx and major salivary glands. No cervical lymphadenopathy. Unremarkable thyroid gland. UPPER CHEST: No pneumothorax or pleural effusion. No nodules or masses. AORTIC ARCH: There is mild calcific atherosclerosis of the aortic arch. There is no aneurysm, dissection or hemodynamically significant stenosis of the visualized portion of the aorta. Conventional 3 vessel aortic branching pattern. The visualized proximal subclavian arteries are widely patent. RIGHT CAROTID SYSTEM: No dissection, occlusion or aneurysm. There is mixed density atherosclerosis extending into the proximal ICA, resulting in 54% stenosis. LEFT CAROTID SYSTEM: Interval left carotid endarterectomy, widely patent. VERTEBRAL ARTERIES: Left dominant configuration. Both origins are clearly patent. Moderate multifocal atherosclerosis of the left V3 segment, unchanged. CTA HEAD FINDINGS POSTERIOR CIRCULATION: --Vertebral arteries:  Atherosclerotic calcification of both V4 segments without high-grade stenosis. --Posterior inferior cerebellar arteries (PICA): Patent origins from the vertebral arteries. --Anterior inferior cerebellar arteries (AICA): Patent origins from the basilar artery. --Basilar artery: Short segment occlusion or high-grade stenosis of the basilar artery is unchanged compared to 07/11/2019. --Superior cerebellar arteries: Normal. --Posterior cerebral arteries: Normal. The left PCA is partially supplied by a posterior communicating artery (p-comm). Right P-comm is diminutive or absent. ANTERIOR CIRCULATION: --Intracranial internal carotid arteries: Unchanged bilateral atherosclerotic calcification of both internal carotid arteries at the skull base. Moderate stenosis is unchanged. --Anterior cerebral arteries (ACA): Normal. Hypoplastic right A1 segment, normal variant. --Middle cerebral arteries (MCA): Severe stenosis of the proximal right M1 segment is unchanged. There is mild narrowing of the distal left M1 segment. Distal branches are patent. VENOUS SINUSES: As permitted by contrast timing, patent. ANATOMIC VARIANTS: None Review of the MIP images confirms the  above findings. IMPRESSION: 1. No emergent large vessel occlusion. 2. 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 3. 54% stenosis of the proximal right internal carotid artery, unchanged. 4. Status post left carotid endarterectomy/stent, widely patent. 5. Unchanged severe mid basilar stenosis. These results were called by telephone at the time of interpretation on 09/14/2019 at 10:33 pm to provider MCNEILL [facility] , who verbally acknowledged these results. Electronically Signed   By: Ulyses Jarred M.D.   On: 09/14/2019 22:47    Procedures .Critical Care Performed by: Antonietta Breach, PA-C Authorized by: Antonietta Breach, PA-C   Critical care provider statement:    Critical care time (minutes):  45   Critical care was necessary to treat or prevent  imminent or life-threatening deterioration of the following conditions:  CNS failure or compromise and metabolic crisis (encephalopathy, hyponatremia)   Critical care was time spent personally by me on the following activities:  Discussions with consultants, evaluation of patient's response to treatment, examination of patient, ordering and performing treatments and interventions, ordering and review of laboratory studies, ordering and review of radiographic studies, pulse oximetry, re-evaluation of patient's condition, obtaining history from patient or surrogate and review of old charts   (including critical care time)  Medications Ordered in ED Medications  clevidipine (CLEVIPREX) 0.5 MG/ML infusion (  Not Given 09/15/19 0027)  potassium chloride 10 mEq in 100 mL IVPB (10 mEq Intravenous New Bag/Given 09/15/19 0146)  hydrALAZINE (APRESOLINE) tablet 25 mg (has no administration in time range)  simvastatin (ZOCOR) tablet 40 mg (has no administration in time range)  clopidogrel (PLAVIX) tablet 75 mg (75 mg Oral Given 09/15/19 0147)  aspirin EC tablet 81 mg (has no administration in time range)  acetaminophen (TYLENOL) tablet 650 mg (has no administration in time range)    Or  acetaminophen (TYLENOL) suppository 650 mg (has no administration in time range)  ondansetron (ZOFRAN) tablet 4 mg (has no administration in time range)    Or  ondansetron (ZOFRAN) injection 4 mg (has no administration in time range)  enoxaparin (LOVENOX) injection 40 mg (has no administration in time range)  labetalol (NORMODYNE) injection 10-20 mg (has no administration in time range)  insulin aspart (novoLOG) injection 0-15 Units (has no administration in time range)  insulin aspart (novoLOG) injection 0-5 Units (0 Units Subcutaneous Not Given 09/15/19 0054)  0.9 %  sodium chloride infusion ( Intravenous New Bag/Given 09/14/19 2331)  iohexol (OMNIPAQUE) 350 MG/ML injection 100 mL (100 mLs Intravenous Contrast Given 09/14/19 2233)   iohexol (OMNIPAQUE) 350 MG/ML injection 100 mL (100 mLs Intravenous Contrast Given 09/14/19 2241)  labetalol (NORMODYNE) injection 10 mg (10 mg Intravenous Given 09/14/19 2345)  sodium chloride 0.9 % bolus 500 mL (500 mLs Intravenous New Bag/Given 09/14/19 2335)    ED Course  I have reviewed the triage vital signs and the nursing notes.  Pertinent labs & imaging results that were available during my care of the patient were reviewed by me and considered in my medical decision making (see chart for details).  Clinical Course as of Sep 14 148  Wed Sep 14, 2019  2233 Case discussed with MD Leonel Ramsay, Neurology. If MRI positive for CVA, will TPA. Otherwise, will medically admit for correction of hyponatremia.    [ZO]  1096 CT and CTA reassuring. MRI read discussed between MD [facility] and radiologist; negative. Per husband, patient with decreased PO intake over the past 3-4 days. Ate one egg for breakfast and a peanut butter cracker for lunch. Vomited  x 1 after lunchtime. Noted to have a low sodium level. Also quite hypertensive. Question hyponatremia vs hypertensive encephalopathy. Will require admission.   [IC]  1798 Patient assessed at bedside. She is alert, pleasant. Answers some questions appropriately, but when she attempts to complete full sentences speech is more of a "word salad" at times. She confirms poor appetite recently. States she took her Micardis this AM and only took her hydralazine twice.   [KH]    Clinical Course User Index [KH] Antonietta Breach, PA-C    MDM Rules/Calculators/A&P                       79 year old female to be admitted for further evaluation of altered mental status and acute encephalopathy.  Question hypertensive encephalopathy and/or effects of hyponatremia with sodium of 120.  Patient presented acutely to the ED as a code stroke.  Initially assessed at the bridge by Dr. Leonel Ramsay of Neurology.  CT and MRI evaluations today have been reassuring.  Will admit  to hospitalist service for ongoing management of hypertension and electrolyte derangements.  Vitals:   09/15/19 0015 09/15/19 0030 09/15/19 0045 09/15/19 0130  BP: (!) 181/74 (!) 177/64 (!) 172/64 (!) 147/89  Pulse: 80 78 80 80  Resp: (!) 21 18 (!) 22 (!) 21  Temp:      TempSrc:      SpO2: 100% 98% 99% 98%  Weight:      Height:        Final Clinical Impression(s) / ED Diagnoses Final diagnoses:  Acute encephalopathy  Hyponatremia  Hypokalemia    Rx / DC Orders ED Discharge Orders    None       Antonietta Breach, PA-C 09/15/19 0153    Virgel Manifold, MD 09/16/19 1904

## 2019-09-14 NOTE — ED Triage Notes (Signed)
Pt arrives via EMS as a code stroke. Last known well was 8PM this evening. Family reported slurred speech. Pt had a fall last week. Pt has history of TIA and carotid stent.

## 2019-09-14 NOTE — Code Documentation (Addendum)
Responded to Code Stroke called at 2128 for slurred speech, [id number]. Pt arrived at 2152, NIH-4 for BLE weakness, slurred speech, and aphasia. CT head negative. CTA-no LVO. Pt taken to MRI STAT at 2230 which was negative for stroke. Na-120. Plan to admit to medical team.

## 2019-09-14 NOTE — ED Notes (Signed)
Pt's husband wants an update when possible [phone] [id number] [facility].

## 2019-09-15 ENCOUNTER — Encounter (HOSPITAL_COMMUNITY): Payer: Self-pay | Admitting: Internal Medicine

## 2019-09-15 ENCOUNTER — Observation Stay (HOSPITAL_COMMUNITY): Payer: Medicare PPO

## 2019-09-15 ENCOUNTER — Other Ambulatory Visit: Payer: Self-pay

## 2019-09-15 DIAGNOSIS — Z8673 Personal history of transient ischemic attack (TIA), and cerebral infarction without residual deficits: Secondary | ICD-10-CM | POA: Diagnosis not present

## 2019-09-15 DIAGNOSIS — G9341 Metabolic encephalopathy: Secondary | ICD-10-CM

## 2019-09-15 DIAGNOSIS — I6523 Occlusion and stenosis of bilateral carotid arteries: Secondary | ICD-10-CM | POA: Diagnosis present

## 2019-09-15 DIAGNOSIS — E785 Hyperlipidemia, unspecified: Secondary | ICD-10-CM | POA: Diagnosis present

## 2019-09-15 DIAGNOSIS — E119 Type 2 diabetes mellitus without complications: Secondary | ICD-10-CM | POA: Diagnosis present

## 2019-09-15 DIAGNOSIS — Z90710 Acquired absence of both cervix and uterus: Secondary | ICD-10-CM | POA: Diagnosis not present

## 2019-09-15 DIAGNOSIS — I651 Occlusion and stenosis of basilar artery: Secondary | ICD-10-CM | POA: Diagnosis present

## 2019-09-15 DIAGNOSIS — R4701 Aphasia: Secondary | ICD-10-CM | POA: Diagnosis present

## 2019-09-15 DIAGNOSIS — Z95828 Presence of other vascular implants and grafts: Secondary | ICD-10-CM | POA: Diagnosis not present

## 2019-09-15 DIAGNOSIS — Z79899 Other long term (current) drug therapy: Secondary | ICD-10-CM | POA: Diagnosis not present

## 2019-09-15 DIAGNOSIS — I160 Hypertensive urgency: Secondary | ICD-10-CM | POA: Diagnosis present

## 2019-09-15 DIAGNOSIS — Z7902 Long term (current) use of antithrombotics/antiplatelets: Secondary | ICD-10-CM | POA: Diagnosis not present

## 2019-09-15 DIAGNOSIS — Z8616 Personal history of COVID-19: Secondary | ICD-10-CM | POA: Diagnosis not present

## 2019-09-15 DIAGNOSIS — Z7982 Long term (current) use of aspirin: Secondary | ICD-10-CM | POA: Diagnosis not present

## 2019-09-15 DIAGNOSIS — Z8249 Family history of ischemic heart disease and other diseases of the circulatory system: Secondary | ICD-10-CM | POA: Diagnosis not present

## 2019-09-15 DIAGNOSIS — Z96651 Presence of right artificial knee joint: Secondary | ICD-10-CM | POA: Diagnosis present

## 2019-09-15 DIAGNOSIS — Z888 Allergy status to other drugs, medicaments and biological substances status: Secondary | ICD-10-CM | POA: Diagnosis not present

## 2019-09-15 DIAGNOSIS — I6601 Occlusion and stenosis of right middle cerebral artery: Secondary | ICD-10-CM | POA: Diagnosis present

## 2019-09-15 DIAGNOSIS — E871 Hypo-osmolality and hyponatremia: Principal | ICD-10-CM

## 2019-09-15 DIAGNOSIS — R4182 Altered mental status, unspecified: Secondary | ICD-10-CM | POA: Diagnosis present

## 2019-09-15 DIAGNOSIS — E876 Hypokalemia: Secondary | ICD-10-CM | POA: Diagnosis present

## 2019-09-15 DIAGNOSIS — I10 Essential (primary) hypertension: Secondary | ICD-10-CM | POA: Diagnosis present

## 2019-09-15 DIAGNOSIS — Z955 Presence of coronary angioplasty implant and graft: Secondary | ICD-10-CM | POA: Diagnosis not present

## 2019-09-15 DIAGNOSIS — Z7984 Long term (current) use of oral hypoglycemic drugs: Secondary | ICD-10-CM | POA: Diagnosis not present

## 2019-09-15 DIAGNOSIS — I2510 Atherosclerotic heart disease of native coronary artery without angina pectoris: Secondary | ICD-10-CM | POA: Diagnosis present

## 2019-09-15 LAB — RAPID URINE DRUG SCREEN, HOSP PERFORMED
Amphetamines: NOT DETECTED
Barbiturates: NOT DETECTED
Benzodiazepines: NOT DETECTED
Cocaine: NOT DETECTED
Opiates: NOT DETECTED
Tetrahydrocannabinol: NOT DETECTED

## 2019-09-15 LAB — GLUCOSE, CAPILLARY
Glucose-Capillary: 132 mg/dL — ABNORMAL HIGH (ref 70–99)
Glucose-Capillary: 126 mg/dL — ABNORMAL HIGH (ref 70–99)
Glucose-Capillary: 134 mg/dL — ABNORMAL HIGH (ref 70–99)
Glucose-Capillary: 129 mg/dL — ABNORMAL HIGH (ref 70–99)

## 2019-09-15 LAB — BASIC METABOLIC PANEL
Anion gap: 14 (ref 5–15)
Anion gap: 15 (ref 5–15)
Anion gap: 14 (ref 5–15)
Anion gap: 12 (ref 5–15)
Anion gap: 10 (ref 5–15)
BUN: 8 mg/dL (ref 8–23)
BUN: 9 mg/dL (ref 8–23)
BUN: 9 mg/dL (ref 8–23)
BUN: 12 mg/dL (ref 8–23)
BUN: 12 mg/dL (ref 8–23)
CO2: 27 mmol/L (ref 22–32)
CO2: 25 mmol/L (ref 22–32)
CO2: 23 mmol/L (ref 22–32)
CO2: 24 mmol/L (ref 22–32)
CO2: 24 mmol/L (ref 22–32)
Calcium: 9 mg/dL (ref 8.9–10.3)
Calcium: 8.8 mg/dL — ABNORMAL LOW (ref 8.9–10.3)
Calcium: 8.8 mg/dL — ABNORMAL LOW (ref 8.9–10.3)
Calcium: 9.5 mg/dL (ref 8.9–10.3)
Calcium: 9.5 mg/dL (ref 8.9–10.3)
Chloride: 80 mmol/L — ABNORMAL LOW (ref 98–111)
Chloride: 83 mmol/L — ABNORMAL LOW (ref 98–111)
Chloride: 85 mmol/L — ABNORMAL LOW (ref 98–111)
Chloride: 87 mmol/L — ABNORMAL LOW (ref 98–111)
Chloride: 92 mmol/L — ABNORMAL LOW (ref 98–111)
Creatinine, Ser: 0.39 mg/dL — ABNORMAL LOW (ref 0.44–1.00)
Creatinine, Ser: 0.42 mg/dL — ABNORMAL LOW (ref 0.44–1.00)
Creatinine, Ser: 0.45 mg/dL (ref 0.44–1.00)
Creatinine, Ser: 0.52 mg/dL (ref 0.44–1.00)
Creatinine, Ser: 0.5 mg/dL (ref 0.44–1.00)
GFR calc Af Amer: >60 mL/min (ref >60)
GFR calc Af Amer: >60 mL/min (ref >60)
GFR calc Af Amer: >60 mL/min (ref >60)
GFR calc Af Amer: >60 mL/min (ref >60)
GFR calc Af Amer: >60 mL/min (ref >60)
GFR calc non Af Amer: >60 mL/min (ref >60)
GFR calc non Af Amer: >60 mL/min (ref >60)
GFR calc non Af Amer: >60 mL/min (ref >60)
GFR calc non Af Amer: >60 mL/min (ref >60)
GFR calc non Af Amer: >60 mL/min (ref >60)
Glucose, Bld: 145 mg/dL — ABNORMAL HIGH (ref 70–99)
Glucose, Bld: 134 mg/dL — ABNORMAL HIGH (ref 70–99)
Glucose, Bld: 128 mg/dL — ABNORMAL HIGH (ref 70–99)
Glucose, Bld: 138 mg/dL — ABNORMAL HIGH (ref 70–99)
Glucose, Bld: 125 mg/dL — ABNORMAL HIGH (ref 70–99)
Potassium: 3.5 mmol/L (ref 3.5–5.1)
Potassium: 3 mmol/L — ABNORMAL LOW (ref 3.5–5.1)
Potassium: 3.4 mmol/L — ABNORMAL LOW (ref 3.5–5.1)
Potassium: 4.5 mmol/L (ref 3.5–5.1)
Potassium: 4.3 mmol/L (ref 3.5–5.1)
Sodium: 121 mmol/L — ABNORMAL LOW (ref 135–145)
Sodium: 123 mmol/L — ABNORMAL LOW (ref 135–145)
Sodium: 122 mmol/L — ABNORMAL LOW (ref 135–145)
Sodium: 123 mmol/L — ABNORMAL LOW (ref 135–145)
Sodium: 126 mmol/L — ABNORMAL LOW (ref 135–145)

## 2019-09-15 LAB — CBG MONITORING, ED: Glucose-Capillary: 160 mg/dL — ABNORMAL HIGH (ref 70–99)

## 2019-09-15 LAB — SARS CORONAVIRUS 2 (TAT 6-24 HRS): SARS Coronavirus 2: POSITIVE — AB

## 2019-09-15 LAB — CK: Total CK: 1454 U/L — ABNORMAL HIGH (ref 38–234)

## 2019-09-15 LAB — SODIUM, URINE, RANDOM: Sodium, Ur: 100 mmol/L

## 2019-09-15 LAB — TSH: TSH: 2.126 u[IU]/mL (ref 0.350–4.500)

## 2019-09-15 LAB — OSMOLALITY, URINE: Osmolality, Ur: 291 mOsm/kg — ABNORMAL LOW (ref 300–900)

## 2019-09-15 MED ORDER — POTASSIUM CHLORIDE 10 MEQ/100ML IV SOLN
10.0000 meq | INTRAVENOUS | Status: AC
  Administered 2019-09-15 (×2): 10 meq via INTRAVENOUS
  Filled 2019-09-15 (×2): qty 100

## 2019-09-15 MED ORDER — POTASSIUM CHLORIDE CRYS ER 20 MEQ PO TBCR
40.0000 meq | EXTENDED_RELEASE_TABLET | Freq: Once | ORAL | Status: AC
  Administered 2019-09-15: 40 meq via ORAL
  Filled 2019-09-15: qty 2

## 2019-09-15 MED ORDER — ENOXAPARIN SODIUM 40 MG/0.4ML ~~LOC~~ SOLN
40.0000 mg | SUBCUTANEOUS | Status: DC
  Administered 2019-09-15 – 2019-09-16 (×2): 40 mg via SUBCUTANEOUS
  Filled 2019-09-15 (×2): qty 0.4

## 2019-09-15 MED ORDER — LABETALOL HCL 5 MG/ML IV SOLN: 10.0000 mg | INTRAVENOUS | Status: DC | PRN

## 2019-09-15 MED ORDER — SODIUM CHLORIDE 0.9 % IV SOLN: INTRAVENOUS | Status: DC

## 2019-09-15 MED ORDER — ONDANSETRON HCL 4 MG/2ML IJ SOLN: 4.0000 mg | Freq: Four times a day (QID) | INTRAMUSCULAR | Status: DC | PRN

## 2019-09-15 MED ORDER — ACETAMINOPHEN 650 MG RE SUPP: 650.0000 mg | Freq: Four times a day (QID) | RECTAL | Status: DC | PRN

## 2019-09-15 MED ORDER — ENSURE ENLIVE PO LIQD
237.0000 mL | Freq: Two times a day (BID) | ORAL | Status: DC
  Administered 2019-09-15 – 2019-09-16 (×3): 237 mL via ORAL

## 2019-09-15 MED ORDER — INSULIN ASPART 100 UNIT/ML ~~LOC~~ SOLN: 0.0000 [IU] | Freq: Every day | SUBCUTANEOUS | Status: DC

## 2019-09-15 MED ORDER — ACETAMINOPHEN 325 MG PO TABS
650.0000 mg | ORAL_TABLET | Freq: Four times a day (QID) | ORAL | Status: DC | PRN
  Administered 2019-09-15: 650 mg via ORAL
  Filled 2019-09-15: qty 2

## 2019-09-15 MED ORDER — ONDANSETRON HCL 4 MG PO TABS
4.0000 mg | ORAL_TABLET | Freq: Four times a day (QID) | ORAL | Status: DC | PRN
  Administered 2019-09-15: 4 mg via ORAL
  Filled 2019-09-15: qty 1

## 2019-09-15 MED ORDER — INSULIN ASPART 100 UNIT/ML ~~LOC~~ SOLN
0.0000 [IU] | Freq: Three times a day (TID) | SUBCUTANEOUS | Status: DC
  Administered 2019-09-15 – 2019-09-16 (×4): 2 [IU] via SUBCUTANEOUS
  Administered 2019-09-16: 1 [IU] via SUBCUTANEOUS

## 2019-09-15 NOTE — H&P (Signed)
History and Physical    Margaret Butler ZSW:[id number] DOB: Apr 26, 1941 DOA: 09/14/2019  PCP: Kristopher Glee., MD  Patient coming from: Home  I have personally briefly reviewed patient's old medical records in [place]  Chief Complaint: AMS  HPI: Margaret Butler is a 79 y.o. female with medical history significant of HTN, DM2, TIA carotid artery stenosis s/p L CEA in July 2020, restenosis s/p stent in Dec 2020.  H/o HTN urgency vs TIA believed to be causing aphasia and stroke like symptoms in July last year as well.  Patient presents to the ED as a code stroke secondary to aphasia.  Patient apparently not feeling well with nausea and lack of appetite over past few days.  Had confusion on waking up x2 days ago, resolved spontaneously.  Today went to bed at 1930 and woke up at 2000 with slurred speech and confusion. Presented to the ED with concern for aphasia. Noted to be hypertensive as well.   ED Course: Severe HTN with SBPs in the 200s, Hyponatremia with sodium 120.  Pt given 500cc NS bolus and another portion of liter NS.  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  MRI brain neg for acute stroke.  Pt given labetalol, BP now 322G systolic.  Patient with significantly improved aphasia and object naming at this time.   Review of Systems: As per HPI, otherwise all review of systems negative.  Past Medical History:  Diagnosis Date  . Anemia    Low iron in the past  . Arthritis   . Constipation   . Coronary artery disease    stent in 2019  . Depression    "in the past"  . DM type 2 (diabetes mellitus, type 2) ([place]) 02/16/2019  . GERD (gastroesophageal reflux disease)    "years ago"  . Hyperlipidemia   . Hypertension   . Left carotid stenosis 02/16/2019  . TIA (transient ischemic attack) 02/16/2019    Past Surgical History:  Procedure Laterality Date  . ABDOMINAL  HYSTERECTOMY    . CORONARY ANGIOPLASTY    . ENDARTERECTOMY Left 02/15/2019   Procedure: ENDARTERECTOMY CAROTID;  Surgeon: [name], MD;  Location: [facility];  Service: Vascular;  Laterality: Left;  . JOINT REPLACEMENT Right    knee  . PATCH ANGIOPLASTY Left 02/15/2019   Procedure: Patch Angioplasty Using Hemoshield patch size 0.8cm x 7.6cm;  Surgeon: [name], MD;  Location: [facility];  Service: Vascular;  Laterality: Left;  . TRANSCAROTID ARTERY REVASCULARIZATION Left 07/20/2019   Procedure: LEFT TRANSCAROTID ARTERY REVASCULARIZATION  with 75m x 445mand 1057m 30 mm stent placement;  Surgeon: CaiWaynetta SandyD;  [facility];  Laterality: Left;     reports that she has never smoked. She has never used smokeless tobacco. She reports that she does not drink alcohol or use drugs.  Allergies  Allergen Reactions  . Atorvastatin Other (See Comments)    Muscle pain  . Lisinopril Cough       . Sertraline Other (See Comments)    Caused headaches     Family History  Problem Relation Age of Onset  . Heart attack Father      Prior to Admission medications   Medication Sig Start Date End Date Taking? Authorizing Provider  aspirin EC 81 MG tablet Take 81 mg by mouth daily.    [provider]  blood glucose meter kit and supplies KIT Dispense based on patient and insurance preference. Use up to four times daily as directed. (FOR ICD-9 250.00, 250.01). 02/16/19   Samuella Cota, MD  cetirizine (ZYRTEC) 10 MG tablet Take 10 mg by mouth at bedtime as needed for allergies.    [provider]  Cholecalciferol (VITAMIN D) 50 MCG (2000 UT) tablet Take 2,000 Units by mouth daily.    [provider]  clopidogrel (PLAVIX) 75 MG tablet Take 1 tablet (75 mg total) by mouth daily at 8 pm. 07/15/19   [name], MD  hydrALAZINE (APRESOLINE) 25 MG tablet Take 25 mg by mouth 3 (three) times daily.     [provider]  Magnesium 250 MG TABS Take 500 mg by mouth at bedtime.     [provider]  metFORMIN (GLUCOPHAGE) 500 MG tablet Take 500 mg by mouth 2 (two) times daily.    [provider]  oxymetazoline (AFRIN) 0.05 % nasal spray Place 1 spray into both nostrils 2 (two) times daily as needed for congestion.    [provider]  simvastatin (ZOCOR) 40 MG tablet Take 40 mg by mouth at bedtime.    [provider]  telmisartan-hydrochlorothiazide (MICARDIS HCT) 80-25 MG tablet Take 1 tablet by mouth every morning.  11/13/18   [provider]  TURMERIC PO Take 1,500 mg by mouth daily.     [provider]    Physical Exam: Vitals:   09/14/19 2321 09/14/19 2330 09/14/19 2345 09/15/19 0000  BP:  (!) 200/76 (!) 192/72 (!) 174/78  Pulse:  79 82 80  Resp:  (!) 24 (!) 25 16  Temp: 98.3 F (36.8 C)     TempSrc: Oral     SpO2:  99% 99% 98%  Weight:      Height:        Constitutional: NAD, calm, comfortable Eyes: PERRL, lids and conjunctivae normal ENMT: Mucous membranes are moist. Posterior pharynx clear of any exudate or lesions.Normal dentition.  Neck: normal, supple, no masses, no thyromegaly Respiratory: clear to auscultation bilaterally, no wheezing, no crackles. Normal respiratory effort. No accessory muscle use.  Cardiovascular: Regular rate and rhythm, no murmurs / rubs / gallops. No extremity edema. 2+ pedal pulses. No carotid bruits.  Abdomen: no tenderness, no masses palpated. No hepatosplenomegaly. Bowel sounds positive.  Musculoskeletal: no clubbing / cyanosis. No joint deformity upper and lower extremities. Good ROM, no contractures. Normal muscle tone.  Skin: no rashes, lesions, ulcers. No induration Neurologic: CN 2-12 grossly intact. Sensation intact, DTR normal. Strength 5/5 in all 4.  Psychiatric: Normal judgment and insight. Alert and oriented x 3. Normal mood.    Labs on Admission: I have personally reviewed  following labs and imaging studies  CBC: Recent Labs  Lab 09/14/19 2155 09/14/19 2158  WBC 13.3*  --   NEUTROABS 9.9*  --   HGB 13.2 13.6  HCT 37.1 40.0  MCV 79.6*  --   PLT 280  --    Basic Metabolic Panel: Recent Labs  Lab 09/14/19 2155 09/14/19 2158  NA 120* 118*  K 2.9* 2.9*  CL 79* 80*  CO2 24  --   GLUCOSE 157* 160*  BUN 10 10  CREATININE 0.49 0.40*  CALCIUM 9.4  --    GFR: Estimated Creatinine Clearance: 54 mL/min (A) (by C-G formula based on SCr of 0.4 mg/dL (L)). Liver Function Tests: Recent Labs  Lab 09/14/19 2155  AST 81*  ALT 120*  ALKPHOS 50  BILITOT 1.2  PROT 7.2  ALBUMIN 3.9   No results for input(s): LIPASE, AMYLASE in the last 168 hours. No results for input(s): AMMONIA in the last 168 hours. Coagulation Profile: Recent Labs  Lab 09/14/19 2155  INR 1.0   Cardiac Enzymes: No results for input(s): CKTOTAL, CKMB, CKMBINDEX, TROPONINI in the last 168 hours. BNP (last 3 results) No results for input(s): PROBNP in the last 8760 hours. HbA1C: No results for input(s): HGBA1C in the last 72 hours. CBG: Recent Labs  Lab 09/15/19 0002  GLUCAP 160*   Lipid Profile: No results for input(s): CHOL, HDL, LDLCALC, TRIG, CHOLHDL, LDLDIRECT in the last 72 hours. Thyroid Function Tests: No results for input(s): TSH, T4TOTAL, FREET4, T3FREE, THYROIDAB in the last 72 hours. Anemia Panel: No results for input(s): VITAMINB12, FOLATE, FERRITIN, TIBC, IRON, RETICCTPCT in the last 72 hours. Urine analysis:    Component Value Date/Time   COLORURINE STRAW (A) 09/14/2019 2155   APPEARANCEUR CLEAR 09/14/2019 2155   LABSPEC 1.019 09/14/2019 2155   PHURINE 8.0 09/14/2019 2155   GLUCOSEU 50 (A) 09/14/2019 2155   HGBUR SMALL (A) 09/14/2019 2155   BILIRUBINUR NEGATIVE 09/14/2019 2155   KETONESUR 20 (A) 09/14/2019 2155   PROTEINUR NEGATIVE 09/14/2019 2155   NITRITE NEGATIVE 09/14/2019 2155   LEUKOCYTESUR SMALL (A) 09/14/2019 2155    Radiological Exams on  Admission: CT Code Stroke CTA Head W/WO contrast  Result Date: 09/14/2019 CLINICAL DATA:  Code stroke.  Pure aphasia. EXAM: CT HEAD WITHOUT CONTRAST CT ANGIOGRAPHY OF THE HEAD AND NECK TECHNIQUE: Contiguous axial images were obtained from the base of the skull through the vertex without intravenous contrast. Multidetector CT imaging of the head and neck was performed using the standard protocol during bolus administration of intravenous contrast. Multiplanar CT image reconstructions and MIPs were obtained to evaluate the vascular anatomy. Carotid stenosis measurements (when applicable) are obtained utilizing NASCET criteria, using the distal internal carotid diameter as the denominator. COMPARISON:  07/11/2019 FINDINGS: CT HEAD FINDINGS Brain: There is no mass, hemorrhage or extra-axial collection. There is hypoattenuation of the periventricular white matter, most commonly indicating chronic ischemic microangiopathy. Vascular: No abnormal hyperdensity of the major intracranial arteries or dural venous sinuses. No intracranial atherosclerosis. Skull: The visualized skull base, calvarium and extracranial soft tissues are normal. Sinuses/Orbits: No fluid levels or advanced mucosal thickening of the visualized paranasal sinuses. No mastoid or middle ear effusion. The orbits are normal. ASPECTS ([place] Stroke Program Early CT Score) - Ganglionic level infarction (caudate, lentiform nuclei, internal capsule, insula, M1-M3 cortex): 7 - Supraganglionic infarction (M4-M6 cortex): 3 Total score (0-10 with 10 being normal): 10 CTA NECK FINDINGS SKELETON: There is no bony spinal canal stenosis. No lytic or blastic lesion. OTHER NECK: Normal pharynx, larynx and major salivary glands. No cervical lymphadenopathy. Unremarkable thyroid gland. UPPER CHEST: No pneumothorax or pleural effusion. No nodules or masses. AORTIC ARCH: There is mild calcific atherosclerosis of the aortic arch. There is no aneurysm, dissection or  hemodynamically significant stenosis of the visualized portion of the aorta. Conventional 3 vessel aortic branching pattern. The visualized proximal subclavian arteries are widely patent. RIGHT CAROTID SYSTEM: No dissection, occlusion or aneurysm. There is mixed density atherosclerosis extending into the proximal ICA, resulting in 54% stenosis. LEFT CAROTID SYSTEM: Interval left carotid endarterectomy, widely patent. VERTEBRAL ARTERIES: Left dominant configuration. Both origins are clearly patent. Moderate multifocal atherosclerosis of the left V3 segment, unchanged. CTA HEAD FINDINGS POSTERIOR CIRCULATION: --Vertebral arteries: Atherosclerotic calcification of both V4 segments  without high-grade stenosis. --Posterior inferior cerebellar arteries (PICA): Patent origins from the vertebral arteries. --Anterior inferior cerebellar arteries (AICA): Patent origins from the basilar artery. --Basilar artery: Short segment occlusion or high-grade stenosis of the basilar artery is unchanged compared to 07/11/2019. --Superior cerebellar arteries: Normal. --Posterior cerebral arteries: Normal. The left PCA is partially supplied by a posterior communicating artery (p-comm). Right P-comm is diminutive or absent. ANTERIOR CIRCULATION: --Intracranial internal carotid arteries: Unchanged bilateral atherosclerotic calcification of both internal carotid arteries at the skull base. Moderate stenosis is unchanged. --Anterior cerebral arteries (ACA): Normal. Hypoplastic right A1 segment, normal variant. --Middle cerebral arteries (MCA): Severe stenosis of the proximal right M1 segment is unchanged. There is mild narrowing of the distal left M1 segment. Distal branches are patent. VENOUS SINUSES: As permitted by contrast timing, patent. ANATOMIC VARIANTS: None Review of the MIP images confirms the above findings. IMPRESSION: 1. No emergent large vessel occlusion. 2. 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 3. 54%  stenosis of the proximal right internal carotid artery, unchanged. 4. Status post left carotid endarterectomy/stent, widely patent. 5. Unchanged severe mid basilar stenosis. These results were called by telephone at the time of interpretation on 09/14/2019 at 10:33 pm to provider MCNEILL [facility] , who verbally acknowledged these results. Electronically Signed   By: Ulyses Jarred M.D.   On: 09/14/2019 22:47   CT Code Stroke CTA Neck W/WO contrast  Result Date: 09/14/2019 CLINICAL DATA:  Code stroke.  Pure aphasia. EXAM: CT HEAD WITHOUT CONTRAST CT ANGIOGRAPHY OF THE HEAD AND NECK TECHNIQUE: Contiguous axial images were obtained from the base of the skull through the vertex without intravenous contrast. Multidetector CT imaging of the head and neck was performed using the standard protocol during bolus administration of intravenous contrast. Multiplanar CT image reconstructions and MIPs were obtained to evaluate the vascular anatomy. Carotid stenosis measurements (when applicable) are obtained utilizing NASCET criteria, using the distal internal carotid diameter as the denominator. COMPARISON:  07/11/2019 FINDINGS: CT HEAD FINDINGS Brain: There is no mass, hemorrhage or extra-axial collection. There is hypoattenuation of the periventricular white matter, most commonly indicating chronic ischemic microangiopathy. Vascular: No abnormal hyperdensity of the major intracranial arteries or dural venous sinuses. No intracranial atherosclerosis. Skull: The visualized skull base, calvarium and extracranial soft tissues are normal. Sinuses/Orbits: No fluid levels or advanced mucosal thickening of the visualized paranasal sinuses. No mastoid or middle ear effusion. The orbits are normal. ASPECTS ([place] Stroke Program Early CT Score) - Ganglionic level infarction (caudate, lentiform nuclei, internal capsule, insula, M1-M3 cortex): 7 - Supraganglionic infarction (M4-M6 cortex): 3 Total score (0-10 with 10 being normal): 10  CTA NECK FINDINGS SKELETON: There is no bony spinal canal stenosis. No lytic or blastic lesion. OTHER NECK: Normal pharynx, larynx and major salivary glands. No cervical lymphadenopathy. Unremarkable thyroid gland. UPPER CHEST: No pneumothorax or pleural effusion. No nodules or masses. AORTIC ARCH: There is mild calcific atherosclerosis of the aortic arch. There is no aneurysm, dissection or hemodynamically significant stenosis of the visualized portion of the aorta. Conventional 3 vessel aortic branching pattern. The visualized proximal subclavian arteries are widely patent. RIGHT CAROTID SYSTEM: No dissection, occlusion or aneurysm. There is mixed density atherosclerosis extending into the proximal ICA, resulting in 54% stenosis. LEFT CAROTID SYSTEM: Interval left carotid endarterectomy, widely patent. VERTEBRAL ARTERIES: Left dominant configuration. Both origins are clearly patent. Moderate multifocal atherosclerosis of the left V3 segment, unchanged. CTA HEAD FINDINGS POSTERIOR CIRCULATION: --Vertebral arteries: Atherosclerotic calcification of both V4 segments without  high-grade stenosis. --Posterior inferior cerebellar arteries (PICA): Patent origins from the vertebral arteries. --Anterior inferior cerebellar arteries (AICA): Patent origins from the basilar artery. --Basilar artery: Short segment occlusion or high-grade stenosis of the basilar artery is unchanged compared to 07/11/2019. --Superior cerebellar arteries: Normal. --Posterior cerebral arteries: Normal. The left PCA is partially supplied by a posterior communicating artery (p-comm). Right P-comm is diminutive or absent. ANTERIOR CIRCULATION: --Intracranial internal carotid arteries: Unchanged bilateral atherosclerotic calcification of both internal carotid arteries at the skull base. Moderate stenosis is unchanged. --Anterior cerebral arteries (ACA): Normal. Hypoplastic right A1 segment, normal variant. --Middle cerebral arteries (MCA): Severe  stenosis of the proximal right M1 segment is unchanged. There is mild narrowing of the distal left M1 segment. Distal branches are patent. VENOUS SINUSES: As permitted by contrast timing, patent. ANATOMIC VARIANTS: None Review of the MIP images confirms the above findings. IMPRESSION: 1. No emergent large vessel occlusion. 2. 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 3. 54% stenosis of the proximal right internal carotid artery, unchanged. 4. Status post left carotid endarterectomy/stent, widely patent. 5. Unchanged severe mid basilar stenosis. These results were called by telephone at the time of interpretation on 09/14/2019 at 10:33 pm to provider MCNEILL [facility] , who verbally acknowledged these results. Electronically Signed   By: Ulyses Jarred M.D.   On: 09/14/2019 22:47   MR BRAIN WO CONTRAST  Result Date: 09/14/2019 CLINICAL DATA:  Stroke follow-up EXAM: MRI HEAD WITHOUT CONTRAST TECHNIQUE: Multiplanar, multiecho pulse sequences of the brain and surrounding structures were obtained without intravenous contrast. COMPARISON:  02/13/2019 brain MRI FINDINGS: BRAIN: No acute infarct, acute hemorrhage or extra-axial collection. Early confluent hyperintense T2-weighted signal of the periventricular and deep white matter, most commonly due to chronic ischemic microangiopathy. Normal volume of brain parenchyma and CSF spaces. Midline structures are normal. VASCULAR: Major flow voids are preserved. Susceptibility-sensitive sequences show no chronic microhemorrhage or superficial siderosis. SKULL AND UPPER CERVICAL SPINE: Normal calvarium and skull base. Visualized upper cervical spine and soft tissues are normal. SINUSES/ORBITS: No fluid levels or advanced mucosal thickening. No mastoid or middle ear effusion. Normal orbits. Other: None. IMPRESSION: 1. No acute intracranial abnormality. 2. Findings of chronic small vessel ischemia. Electronically Signed   By: Ulyses Jarred M.D.   On: 09/14/2019  23:25   CT HEAD CODE STROKE WO CONTRAST  Result Date: 09/14/2019 CLINICAL DATA:  Code stroke.  Pure aphasia. EXAM: CT HEAD WITHOUT CONTRAST CT ANGIOGRAPHY OF THE HEAD AND NECK TECHNIQUE: Contiguous axial images were obtained from the base of the skull through the vertex without intravenous contrast. Multidetector CT imaging of the head and neck was performed using the standard protocol during bolus administration of intravenous contrast. Multiplanar CT image reconstructions and MIPs were obtained to evaluate the vascular anatomy. Carotid stenosis measurements (when applicable) are obtained utilizing NASCET criteria, using the distal internal carotid diameter as the denominator. COMPARISON:  07/11/2019 FINDINGS: CT HEAD FINDINGS Brain: There is no mass, hemorrhage or extra-axial collection. There is hypoattenuation of the periventricular white matter, most commonly indicating chronic ischemic microangiopathy. Vascular: No abnormal hyperdensity of the major intracranial arteries or dural venous sinuses. No intracranial atherosclerosis. Skull: The visualized skull base, calvarium and extracranial soft tissues are normal. Sinuses/Orbits: No fluid levels or advanced mucosal thickening of the visualized paranasal sinuses. No mastoid or middle ear effusion. The orbits are normal. ASPECTS [facility] Stroke Program Early CT Score) - Ganglionic level infarction (caudate, lentiform nuclei, internal capsule, insula, M1-M3 cortex): 7 - Supraganglionic  infarction (M4-M6 cortex): 3 Total score (0-10 with 10 being normal): 10 CTA NECK FINDINGS SKELETON: There is no bony spinal canal stenosis. No lytic or blastic lesion. OTHER NECK: Normal pharynx, larynx and major salivary glands. No cervical lymphadenopathy. Unremarkable thyroid gland. UPPER CHEST: No pneumothorax or pleural effusion. No nodules or masses. AORTIC ARCH: There is mild calcific atherosclerosis of the aortic arch. There is no aneurysm, dissection or hemodynamically  significant stenosis of the visualized portion of the aorta. Conventional 3 vessel aortic branching pattern. The visualized proximal subclavian arteries are widely patent. RIGHT CAROTID SYSTEM: No dissection, occlusion or aneurysm. There is mixed density atherosclerosis extending into the proximal ICA, resulting in 54% stenosis. LEFT CAROTID SYSTEM: Interval left carotid endarterectomy, widely patent. VERTEBRAL ARTERIES: Left dominant configuration. Both origins are clearly patent. Moderate multifocal atherosclerosis of the left V3 segment, unchanged. CTA HEAD FINDINGS POSTERIOR CIRCULATION: --Vertebral arteries: Atherosclerotic calcification of both V4 segments without high-grade stenosis. --Posterior inferior cerebellar arteries (PICA): Patent origins from the vertebral arteries. --Anterior inferior cerebellar arteries (AICA): Patent origins from the basilar artery. --Basilar artery: Short segment occlusion or high-grade stenosis of the basilar artery is unchanged compared to 07/11/2019. --Superior cerebellar arteries: Normal. --Posterior cerebral arteries: Normal. The left PCA is partially supplied by a posterior communicating artery (p-comm). Right P-comm is diminutive or absent. ANTERIOR CIRCULATION: --Intracranial internal carotid arteries: Unchanged bilateral atherosclerotic calcification of both internal carotid arteries at the skull base. Moderate stenosis is unchanged. --Anterior cerebral arteries (ACA): Normal. Hypoplastic right A1 segment, normal variant. --Middle cerebral arteries (MCA): Severe stenosis of the proximal right M1 segment is unchanged. There is mild narrowing of the distal left M1 segment. Distal branches are patent. VENOUS SINUSES: As permitted by contrast timing, patent. ANATOMIC VARIANTS: None Review of the MIP images confirms the above findings. IMPRESSION: 1. No emergent large vessel occlusion. 2. 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 3. 54% stenosis of the  proximal right internal carotid artery, unchanged. 4. Status post left carotid endarterectomy/stent, widely patent. 5. Unchanged severe mid basilar stenosis. These results were called by telephone at the time of interpretation on 09/14/2019 at 10:33 pm to provider MCNEILL [facility] , who verbally acknowledged these results. Electronically Signed   By: Ulyses Jarred M.D.   On: 09/14/2019 22:47    EKG: Independently reviewed.  Assessment/Plan Principal Problem:   Acute metabolic encephalopathy Active Problems:   Expressive aphasia   Hypertensive urgency   DM type 2 (diabetes mellitus, type 2) (HCC)   Hyponatremia   Hypokalemia    1. Acute metabolic encephalopathy - 1. Due to HTN urgency and/or hyponatremia 2. See neuro consult note 3. Treating these as below. 2. HTN urgency - 1. Labetalol IV PRN, goal SBP <180 and DBP < 110 2. Continue PO hydralazine 3. Holding telmisartan-HCTZ due to hyponatremia 3. Hyponatremia - 1. Check urine sodium 2. BMP Q4H 3. Need to see repeat BMP to see how she responded to IVF in ED, as well as urine sodium before deciding on more IV NS or not (need to r/o SIADH) 4. Check CXR 5. Neuro checks 4. Hypokalemia - replace 5. DM2 - 1. Hold metformin 2. Mod scale SSI AC/HS 6. H/o TIA - 1. Cont ASA / Plavix  DVT prophylaxis: Lovenox Code Status: Full Family Communication: No family in room Disposition Plan: Home after admit Consults called: None Admission status: Place in 44    Izzac Rockett, [place] Hospitalists  How to contact the [facility] Attending or Consulting  provider [place] or covering provider during after hours [place], for this patient?  1. Check the care team in [facility] and look for a) attending/consulting [facility] provider listed and b) the [facility] team listed 2. Log into [email or web address]  Amion Physician Scheduling and messaging for groups and whole hospitals  On call and physician scheduling software for group practices, residents, hospitalists and  other medical providers for call, clinic, rotation and shift schedules. On[employer] is a hospital-wide system for scheduling doctors and paging doctors on call. EasyPlot is for scientific plotting and data analysis.  [email or web address]  and use [place]'s universal password to access. If you do not have the password, please contact the hospital operator.  3. Locate the [facility] provider you are looking for under [facility] and page to a number that you can be directly reached. 4. If you still have difficulty reaching the provider, please page the [facility] (Director on Call) for the Hospitalists listed on amion for assistance.  09/15/2019, 12:18 AM

## 2019-09-15 NOTE — Progress Notes (Signed)
Patient 79 years old white female admitted ,through [facility] E d with code stroke which was later cancelled. Dx hyponatremia and HTN.Marland Kitchen  Alert and oriented x 3 follows command appropriately. [place] Plan of care review with pt. pt demonstratest understanding. Call bell within reach.Cardiac monitor in use. RN will continue to monitor. Pt positive for COVID-19 last month with no symptoms at present.SAR  CoVid-19  test  Pending.

## 2019-09-15 NOTE — ED Notes (Signed)
Pt incontinent of Urine. Pt cleaned, linens and gown changed.

## 2019-09-15 NOTE — Plan of Care (Signed)
79 yo female with a history of type 2 diabetes, hypertension, TIA carotid artery stenosis status post left carotid endarterectomy in July 2020 restenosed status post stent in December 2020.  She lives with her husband at home.  She is now admitted with generalized weakness and lethargy. She is found to have low sodium likely secondary to decreased nutritional intake and HCTZ. Discussed with husband in detail.  Patient had Covid last month got her first dose of vaccine due for the second 1 in couple of weeks. She was not taking her simvastatin 40 mg full dose due to muscle weakness she kept taking only half of it since her LDL is 117 we have switched her to full dose and discussed with the husband that she must be taking the full dose simvastatin. Sodium is still low 122 we will continue normal saline. She is awake and alert and answers all my questions appropriately. Husband and patient thinks she is better than yesterday her speech is better than yesterday though she has generalized weakness. Admitted with hyponatremia likely secondary to poor nutritional intake in the setting of HCTZ versus SIADH. She has an appointment with Dr. Gilmer Mor tomorrow I have sent a chat message to him.

## 2019-09-15 NOTE — Progress Notes (Addendum)
High urine sodium noted: Thiazide diuretic induced vs SIADH.  Repeat BMP pending now and again at 0600.  Got small IVF bolus in ED.  Clinically / neurologically patient improved significantly since presentation, therefore will wait and see how she responds to small IVF bolus and holding HCTZ.

## 2019-09-15 NOTE — Consult Note (Signed)
Hospital Consult    Reason for Consult:  Admission for lethargy/weakness Referring Physician:  Dr. Rodena Piety MRN #:  [id number]  History of Present Illness: This is a 79 y.o. female with history of left carotid endarterectomy and subsequent left carotid stent for recurrent disease.  Patient is now admitted with lethargy and weakness underwent stroke work-up which was negative.  She was scheduled to see me tomorrow in the office but began having difficulty with speaking and confusion that happened last night.  On my exam she is not having any issues today is generally without complaints.  Past Medical History:  Diagnosis Date  . Anemia    Low iron in the past  . Arthritis   . Constipation   . Coronary artery disease    stent in 2019  . Depression    "in the past"  . DM type 2 (diabetes mellitus, type 2) ([place]) 02/16/2019  . GERD (gastroesophageal reflux disease)    "years ago"  . Hyperlipidemia   . Hypertension   . Left carotid stenosis 02/16/2019  . Stroke ([place])   . TIA (transient ischemic attack) 02/16/2019    Past Surgical History:  Procedure Laterality Date  . ABDOMINAL HYSTERECTOMY    . CORONARY ANGIOPLASTY    . ENDARTERECTOMY Left 02/15/2019   Procedure: ENDARTERECTOMY CAROTID;  Surgeon: [name], MD;  Location: [place];  Service: Vascular;  Laterality: Left;  . JOINT REPLACEMENT Right    knee  . PATCH ANGIOPLASTY Left 02/15/2019   Procedure: Patch Angioplasty Using Hemoshield patch size 0.8cm x 7.6cm;  Surgeon: [name], MD;  Location: [place];  Service: Vascular;  Laterality: Left;  . TRANSCAROTID ARTERY REVASCULARIZATION Left 07/20/2019   Procedure: LEFT TRANSCAROTID ARTERY REVASCULARIZATION  with 69m x 425mand 1083m 30 mm stent placement;  Surgeon: CaiWaynetta SandyD;  [facility];  Laterality: Left;    Allergies  Allergen Reactions  . 5-Alpha Reductase Inhibitors   . Atorvastatin Other (See Comments)     Muscle pain  . Lisinopril Cough       . Sertraline Other (See Comments)    Caused headaches     Prior to Admission medications   Medication Sig Start Date End Date Taking? Authorizing Provider  aspirin EC 81 MG tablet Take 81 mg by mouth daily.    [provider]  blood glucose meter kit and supplies KIT Dispense based on patient and insurance preference. Use up to four times daily as directed. (FOR ICD-9 250.00, 250.01). 02/16/19   GooSamuella CotaD  cetirizine (ZYRTEC) 10 MG tablet Take 10 mg by mouth at bedtime as needed for allergies.    [provider]  Cholecalciferol (VITAMIN D) 50 MCG (2000 UT) tablet Take 2,000 Units by mouth daily.    [provider]  clopidogrel (PLAVIX) 75 MG tablet Take 1 tablet (75 mg total) by mouth daily at 8 pm. 07/15/19   CaiWaynetta SandyD  hydrALAZINE (APRESOLINE) 25 MG tablet Take 25 mg by mouth 3 (three) times daily.    [provider]  Magnesium 250 MG TABS Take 500 mg by mouth at bedtime.     [provider]  metFORMIN (GLUCOPHAGE) 500 MG tablet Take 500 mg by mouth 2 (two) times daily.    [provider]  oxymetazoline (AFRIN) 0.05 % nasal spray Place 1 spray into both nostrils 2 (two) times daily as needed for congestion.    [provider]  simvastatin (ZOCOR) 40 MG tablet Take 40 mg by mouth at bedtime.    [provider]  telmisartan-hydrochlorothiazide (MICARDIS HCT) 80-25 MG tablet Take 1 tablet by mouth every morning.  11/13/18   [provider]  TURMERIC PO Take 1,500 mg by mouth daily.     [provider]    Social History   Socioeconomic History  . Marital status: Married    Spouse name: Not on file  . Number of children: Not on file  . Years of education: Not on file  . Highest education level: Not on file  Occupational History  . Not on file  Tobacco Use  . Smoking status: Never Smoker  . Smokeless tobacco: Never Used   Substance and Sexual Activity  . Alcohol use: Never  . Drug use: Never  . Sexual activity: Not on file  Other Topics Concern  . Not on file  Social History Narrative  . Not on file   Social Determinants of Health   Financial Resource Strain:   . Difficulty of Paying Living Expenses: Not on file  Food Insecurity:   . Worried About Charity fundraiser in the Last Year: Not on file  . Ran Out of Food in the Last Year: Not on file  Transportation Needs:   . Lack of Transportation (Medical): Not on file  . Lack of Transportation (Non-Medical): Not on file  Physical Activity:   . Days of Exercise per Week: Not on file  . Minutes of Exercise per Session: Not on file  Stress:   . Feeling of Stress : Not on file  Social Connections:   . Frequency of Communication with Friends and Family: Not on file  . Frequency of Social Gatherings with Friends and Family: Not on file  . Attends Religious Services: Not on file  . Active Member of Clubs or Organizations: Not on file  . Attends Archivist Meetings: Not on file  . Marital Status: Not on file  Intimate Partner Violence:   . Fear of Current or Ex-Partner: Not on file  . Emotionally Abused: Not on file  . Physically Abused: Not on file  . Sexually Abused: Not on file     Family History  Problem Relation Age of Onset  . Heart attack Father     ROS:  Cardiovascular: []  chest pain/pressure []  palpitations []  SOB lying flat []  DOE []  pain in legs while walking []  pain in legs at rest []  pain in legs at night []  non-healing ulcers []  hx of DVT []  swelling in legs  Pulmonary: []  productive cough []  asthma/wheezing []  home O2  Neurologic: [x]  weakness in []  arms []  legs []  numbness in []  arms []  legs []  hx of CVA []  mini stroke [x] difficulty speaking or slurred speech []  temporary loss of vision in one eye []  dizziness  Hematologic: []  hx of cancer []  bleeding problems []  problems with blood clotting  easily  Endocrine:   []  diabetes []  thyroid disease  GI []  vomiting blood []  blood in stool  GU: []  CKD/renal failure []  HD--[]  M/W/F or []  T/T/S []  burning with urination []  blood in urine  Psychiatric: []  anxiety []  depression  Musculoskeletal: []  arthritis []  joint pain  Integumentary: []  rashes []  ulcers  Constitutional: []  fever []  chills   Physical Examination  Vitals:   09/15/19 0612 09/15/19 0756  BP: (!) 117/43 (!) 122/45  Pulse: 70 74  Resp: 18 16  Temp: 98.5 F (36.9 C)  98.9 F (37.2 C)  SpO2: 97% 97%   Body mass index is 26.01 kg/m.  General:  nad HENT: WNL, normocephalic Pulmonary: normal non-labored breathing wheezing Cardiac: palpable radial pulses bilaterally Abdomen: soft, NT/ND, no masses Extremities: no cce Musculoskeletal: no muscle wasting or atrophy  Neurologic: A&O X 3; Appropriate Affect ; SENSATION: normal; MOTOR FUNCTION:  moving all extremities equally. Speech is fluent/normal   CBC    Component Value Date/Time   WBC 13.3 (H) 09/14/2019 2155   RBC 4.66 09/14/2019 2155   HGB 13.6 09/14/2019 2158   HCT 40.0 09/14/2019 2158   PLT 280 09/14/2019 2155   MCV 79.6 (L) 09/14/2019 2155   MCH 28.3 09/14/2019 2155   MCHC 35.6 09/14/2019 2155   RDW 13.9 09/14/2019 2155   LYMPHSABS 2.0 09/14/2019 2155   MONOABS 1.2 (H) 09/14/2019 2155   EOSABS 0.1 09/14/2019 2155   BASOSABS 0.1 09/14/2019 2155    BMET    Component Value Date/Time   NA 122 (L) 09/15/2019 1113   K 3.4 (L) 09/15/2019 1113   CL 85 (L) 09/15/2019 1113   CO2 23 09/15/2019 1113   GLUCOSE 128 (H) 09/15/2019 1113   BUN 9 09/15/2019 1113   CREATININE 0.45 09/15/2019 1113   CALCIUM 8.8 (L) 09/15/2019 1113   GFRNONAA >60 09/15/2019 1113   GFRAA >60 09/15/2019 1113    COAGS: Lab Results  Component Value Date   INR 1.0 09/14/2019   INR 0.9 07/20/2019   INR 0.9 02/12/2019     Non-Invasive Vascular Imaging:   CTA head/neck IMPRESSION: 1. No emergent  large vessel occlusion. 2. 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 3. 54% stenosis of the proximal right internal carotid artery, unchanged. 4. Status post left carotid endarterectomy/stent, widely patent. 5. Unchanged severe mid basilar stenosis.   ASSESSMENT/PLAN: This is a 79 y.o. female with history of tcar for recurrent disease after cea. Stroke workup negative on workup this admission. Will schedule f/u in office in 9 months with carotid duplex.   Nissim Fleischer C. Donzetta Matters, MD [facility] Office: [phone] Pager: [phone]

## 2019-09-15 NOTE — ED Notes (Signed)
First attempt to call report unsuccessful. 

## 2019-09-15 NOTE — Evaluation (Signed)
Physical Therapy Evaluation Patient Details Name: Margaret Butler MRN: [id number] DOB: 1941-03-24 Today's Date: 09/15/2019   History of Present Illness  79 y.o. female with medical history significant of HTN, DM2, TIA carotid artery stenosis s/p L CEA in July 2020, restenosis s/p stent in Dec 2020. H/o HTN urgency vs TIA believed to be causing aphasia and stroke like symptoms in July last year as well. Patient presents to the ED as a code stroke secondary to aphasia and confusion. Pt admitted for hyponatremia.  Clinical Impression  Pt presents to PT with deficits in balance, strength, power, cognition, safety awareness, and gait. Pt demonstrates slight increase in sway and the need for UE support during ambulation to steady. Pt also requiring some physical assistance to mobilize in bed due to generalized weakness. Pt will benefit from continued acute PT POC to improve balance and restore independence in mobility.    Follow Up Recommendations Home health PT;Supervision for mobility/OOB(pt requesting PT [place] from [facility])    Equipment Recommendations  None recommended by PT(pt owns RW)    Recommendations for Other Services       Precautions / Restrictions Precautions Precautions: Fall Restrictions Weight Bearing Restrictions: No      Mobility  Bed Mobility Overal bed mobility: Needs Assistance Bed Mobility: Supine to Sit     Supine to sit: Min assist        Transfers Overall transfer level: Needs assistance Equipment used: Rolling walker (2 wheeled);None Transfers: Sit to/from Stand Sit to Stand: Supervision         General transfer comment: pt performs 3 sit to stands from varying surfaces with supervision, once with RW, twice without  Ambulation/Gait Ambulation/Gait assistance: Min guard Gait Distance (Feet): 150 Feet Assistive device: Rolling walker (2 wheeled) Gait Pattern/deviations: Step-through pattern Gait velocity: functional Gait velocity  interpretation: 1.31 - 2.62 ft/sec, indicative of limited community ambulator General Gait Details: pt with slight increase in lateral sway but otherwise steady with BUE support, one minor LOB when taking R hand off walker but pt able to self-correct  Stairs Stairs: Yes Stairs assistance: Min guard Stair Management: Two rails;Step to pattern;Forwards Number of Stairs: 4    Wheelchair Mobility    Modified Rankin (Stroke Patients Only)       Balance Overall balance assessment: Needs assistance Sitting-balance support: No upper extremity supported;Feet supported Sitting balance-Leahy Scale: Good Sitting balance - Comments: supervision   Standing balance support: Bilateral upper extremity supported Standing balance-Leahy Scale: Good Standing balance comment: supervision with BUE support, close supervision without UE support                             Pertinent Vitals/Pain Pain Assessment: No/denies pain    Home Living Family/patient expects to be discharged to:: Private residence Living Arrangements: Spouse/significant other Available Help at Discharge: Family;Available 24 hours/day Type of Home: House Home Access: Stairs to enter Entrance Stairs-Rails: Can reach both Entrance Stairs-Number of Steps: 5 garage; 5 steps in front Home Layout: Multi-level;Able to live on main level with bedroom/bathroom Home Equipment: Shower seat - built in;Grab bars - tub/shower;Walker - 2 wheels      Prior Function Level of Independence: Independent         Comments: requiring assistance recently for stair neogitation and some intermittent assist for ambulation     Hand Dominance   Dominant Hand: Right    Extremity/Trunk Assessment   Upper Extremity Assessment Upper Extremity Assessment: Overall [facility]  for tasks assessed    Lower Extremity Assessment Lower Extremity Assessment: Generalized weakness    Cervical / Trunk Assessment Cervical / Trunk Assessment:  Normal  Communication   Communication: HOH  Cognition Arousal/Alertness: Awake/alert Behavior During Therapy: WFL for tasks assessed/performed;Impulsive Overall Cognitive Status: Impaired/Different from baseline Area of Impairment: Following commands;Safety/judgement                       Following Commands: (requires verbal cues) Safety/Judgement: Decreased awareness of safety;Decreased awareness of deficits            General Comments General comments (skin integrity, edema, etc.): VSS    Exercises     Assessment/Plan    PT Assessment Patient needs continued PT services  PT Problem List Decreased balance;Decreased strength;Decreased knowledge of use of DME;Decreased safety awareness       PT Treatment Interventions DME instruction;Gait training;Stair training;Functional mobility training;Therapeutic activities;Therapeutic exercise;Balance training;Neuromuscular re-education;Patient/family education    PT Goals (Current goals can be found in the Care Plan section)  Acute Rehab PT Goals Patient Stated Goal: To return to PLOF and improve strength PT Goal Formulation: With patient Time For Goal Achievement: 09/29/19 Potential to Achieve Goals: Good Additional Goals Additional Goal #1: Pt will maintain dynamic standing balance within 10 inches of her base of support without UE support and supervision.    Frequency Min 3X/week   Barriers to discharge        Co-evaluation               AM-PAC PT "6 Clicks" Mobility  Outcome Measure Help needed turning from your back to your side while in a flat bed without using bedrails?: A Little Help needed moving from lying on your back to sitting on the side of a flat bed without using bedrails?: A Little Help needed moving to and from a bed to a chair (including a wheelchair)?: None Help needed standing up from a chair using your arms (e.g., wheelchair or bedside chair)?: None Help needed to walk in hospital  room?: A Little Help needed climbing 3-5 steps with a railing? : A Little 6 Click Score: 20    End of Session Equipment Utilized During Treatment: (none) Activity Tolerance: Patient tolerated treatment well Patient left: in chair;with call bell/phone within reach;with family/visitor present Nurse Communication: Mobility status PT Visit Diagnosis: Unsteadiness on feet (R26.81);Muscle weakness (generalized) (M62.81)    Time: 1610-9604 PT Time Calculation (min) (ACUTE ONLY): 29 min   Charges:   PT Evaluation $PT Eval Low Complexity: 1 Low PT Treatments $Gait Training: 8-22 mins        Arlyss Gandy, PT, DPT Acute Rehabilitation Pager: [phone]   Arlyss Gandy 09/15/2019, 5:26 PM

## 2019-09-15 NOTE — Progress Notes (Signed)
Initial Nutrition Assessment  DOCUMENTATION CODES:   Not applicable  INTERVENTION:   Ensure Enlive po BID, each supplement provides 350 kcal and 20 grams of protein  Magic cup BID with meals, each supplement provides 290 kcal and 9 grams of protein   NUTRITION DIAGNOSIS:   Inadequate oral intake related to poor appetite as evidenced by per patient/family report.   GOAL:   Patient will meet greater than or equal to 90% of their needs   MONITOR:   PO intake, Supplement acceptance, Weight trends, Labs, I & O's  REASON FOR ASSESSMENT:   Malnutrition Screening Tool    ASSESSMENT:   Pt with a PMH significant of HTN, DM2, TIA carotid artery stenosis s/p L CEA, restenosis s/p stent who presented to the ED with aphasia and hypertensive. Pt admitted with acute metabolic encephalopathy d/t HTN urgency and/or hyponatremia.  Pt COVID positive last month, no sx at present; COVID test pending.   Discussed pt with RN who reports little PO intake of breakfast.   Pt reported nausea and lack of appetite for a few days PTA and this morning. Pt eating lunch at time of visit and reports appetite improving. Pt reports enjoying food. Pt agreeable to use of oral nutrition supplements to help meet needs.   Pt noted to have had a 5.3% wt loss x 2 months, which is not significant for timeframe.   Diet Order: Carb Modified  No PO intake documented.   Medications reviewed and include: SSI, KCl  Labs reviewed: Na 123 (L), K+ 3.0 (L) CBGs 132-160  NUTRITION - FOCUSED PHYSICAL EXAM:  Deferred; will perform at follow-up.  Diet Order:   Diet Order            Diet Carb Modified Fluid consistency: Thin; Room service appropriate? Yes  Diet effective now              EDUCATION NEEDS:   No education needs have been identified at this time  Skin:  Skin Assessment: Reviewed RN Assessment  Last BM:  09/14/19  Height:   Ht Readings from Last 1 Encounters:  09/14/19 5\' 3"  (1.6 m)     Weight:   Wt Readings from Last 1 Encounters:  09/14/19 66.6 kg    BMI:  Body mass index is 26.01 kg/m.  Estimated Nutritional Needs:   Kcal:  1500-1700  Protein:  75-90 grams  Fluid:  >1.5L/d   11/12/19, MS, RD, LDN RD pager number and weekend/on-call pager number located in Amion.

## 2019-09-15 NOTE — Consult Note (Signed)
Neurology Consultation Reason for Consult: Confusion Referring Physician: Wilson Singer, S  CC: Difficulty speaking  History is obtained from:patient  HPI: Grier Czerwinski is a 79 y.o. female with a history of diabetes, cerebrovascular disease, carotid artery disease status post TCAR for stenting in December who presents with trouble speaking and confusion that started on awakening.  Her husband states that from time to time when she awakens she has difficulty speaking and confusion for a few minutes that gradually improved.  This happened most recently a couple of days ago.  Tonight around 7:30 PM she laid down for a nap and on awakening  Of note, her husband states that she has only been taking half of the prescribed 40 mg of simvastatin that she is supposed to take.  He reports her most recent lipid panel is 117 LDL.  She presented to the emergency department with difficulty speaking, most notable with word finding difficulty.  She was taken for an emergent CT/CTA which were negative.  I had considered IV TPA, but prior to administering it her sodium came back at 120 which appears to be an acute change.  Given the lack of any other findings other than speech difficulty, she was taken for an emergent MRI which was negative.  Her blood pressure was also in the 220s on arrival.   LKW: 7:30 PM tpa given?: no, not a stroke   ROS: A 14 point ROS was performed and is negative except as noted in the HPI.   Past Medical History:  Diagnosis Date  . Anemia    Low iron in the past  . Arthritis   . Constipation   . Coronary artery disease    stent in 2019  . Depression    "in the past"  . DM type 2 (diabetes mellitus, type 2) ([place]) 02/16/2019  . GERD (gastroesophageal reflux disease)    "years ago"  . Hyperlipidemia   . Hypertension   . Left carotid stenosis 02/16/2019  . TIA (transient ischemic attack) 02/16/2019     Family History  Problem Relation Age of Onset  . Heart attack Father       Social History:  reports that she has never smoked. She has never used smokeless tobacco. She reports that she does not drink alcohol or use drugs.   Exam: Current vital signs: BP (!) 174/78   Pulse 80   Temp 98.3 F (36.8 C) (Oral)   Resp 16   Ht 5\' 3"  (1.6 m)   Wt 66.6 kg   SpO2 98%   BMI 26.01 kg/m  Vital signs in last 24 hours: Temp:  [98.3 F (36.8 C)] 98.3 F (36.8 C) (02/03 2321) Pulse Rate:  [75-82] 80 (02/04 0000) Resp:  [15-25] 16 (02/04 0000) BP: (174-217)/(72-96) 174/78 (02/04 0000) SpO2:  [97 %-99 %] 98 % (02/04 0000) Weight:  [66.6 kg] 66.6 kg (02/03 2258)   Physical Exam  Constitutional: Appears well-developed and well-nourished.  Psych: Affect appropriate to situation Eyes: No scleral injection HENT: No OP obstrucion MSK: no joint deformities.  Cardiovascular: Normal rate and regular rhythm.  Respiratory: Effort normal, non-labored breathing GI: Soft.  No distension. There is no tenderness.  Skin: WDI  Neuro: Mental Status: Patient is awake, alert, oriented to person, place, month, year, and situation. Patient is able to give a clear and coherent history. No signs of  neglect she initially had significant difficulty with word finding difficulty, but this improved after control of her blood pressure and IV fluids. Cranial  Nerves: II: Visual Fields are full. Pupils are equal, round, and reactive to light.   III,IV, VI: EOMI without ptosis or diploplia.  V: Facial sensation is symmetric to temperature VII: Facial movement is symmetric.  VIII: hearing is intact to voice X: Uvula elevates symmetrically XI: Shoulder shrug is symmetric. XII: tongue is midline without atrophy or fasciculations.  Motor: Tone is normal. Bulk is normal. 5/5 strength was present in all four extremities.  Sensory: Sensation is symmetric to light touch and temperature in the arms and legs. Cerebellar: FNF intact bilaterally   I have reviewed labs in epic and the  results pertinent to this consultation are: CMP-sodium 120 Potassium 2.9  I have reviewed the images obtained: CT/CTA-multifocal stenosis, but no occlusions.  MRI brain-negative  Impression: 79 year old female with confusion on awakening in the setting of hyponatremia and hypertension.  Given her history of difficulties with speech and compromised (such as on awakening), I suspect that she may be predisposed to recrudescence with physiological stress.  I suspect that her low sodium coupled with the severe hypertension could have been enough of physiological stressor to cause recrudescence.  Recommendations: 1) LDL of 117 is too high, she should be taking at least her prescribed dose of statin which is simvastatin to 40 mg daily, would consider changing to a higher potency statin such as atorvastatin 40 mg given her degree of cerebrovascular disease. 2) continue aspirin Plavix 3) treat BP as hypertensive urgency/emergency 4) work-up of hyponatremia per internal medicine 5) neurology will be available on an as-needed basis.   Ritta Slot, MD [facility] [phone]  If 7pm- 7am, please page neurology on call as listed in AMION.

## 2019-09-16 ENCOUNTER — Encounter (HOSPITAL_COMMUNITY): Payer: Medicare PPO

## 2019-09-16 ENCOUNTER — Encounter: Payer: Self-pay | Admitting: Vascular Surgery

## 2019-09-16 LAB — BASIC METABOLIC PANEL
Anion gap: 11 (ref 5–15)
BUN: 7 mg/dL — ABNORMAL LOW (ref 8–23)
CO2: 23 mmol/L (ref 22–32)
Calcium: 9.2 mg/dL (ref 8.9–10.3)
Chloride: 101 mmol/L (ref 98–111)
Creatinine, Ser: 0.45 mg/dL (ref 0.44–1.00)
GFR calc Af Amer: >60 mL/min (ref >60)
GFR calc non Af Amer: >60 mL/min (ref >60)
Glucose, Bld: 204 mg/dL — ABNORMAL HIGH (ref 70–99)
Potassium: 4 mmol/L (ref 3.5–5.1)
Sodium: 135 mmol/L (ref 135–145)

## 2019-09-16 LAB — GLUCOSE, CAPILLARY
Glucose-Capillary: 127 mg/dL — ABNORMAL HIGH (ref 70–99)
Glucose-Capillary: 179 mg/dL — ABNORMAL HIGH (ref 70–99)
Glucose-Capillary: 186 mg/dL — ABNORMAL HIGH (ref 70–99)
Glucose-Capillary: 179 mg/dL — ABNORMAL HIGH (ref 70–99)

## 2019-09-16 MED ORDER — CO-ENZYME Q-10 50 MG PO CAPS: 50.0000 mg | ORAL_CAPSULE | Freq: Every day | ORAL | 3 refills | Status: DC

## 2019-09-16 NOTE — Discharge Summary (Signed)
Physician Discharge Summary  Margaret Butler SJG:[id number] DOB: 05/30/1941 DOA: 09/14/2019  PCP: Kristopher Glee., MD  Admit date: 09/14/2019 Discharge date: 09/16/2019  Admitted From: Home Disposition: Home Recommendations for Outpatient Follow-up:  1. Follow up with PCP in 1-2 weeks 2. Please obtain BMP/CBC in one week 3. Dr. Karle Starch please note that I have stopped her Micardis as the hydrochlorothiazide was causing her hyponatremia with poor nutritional intake.  Please restart ACE inhibitor if needed as an outpatient.  I have continued all the other antihypertensives.  She also needs a CPK checked with a BMP next week.  She had severe hyponatremia and elevated CPK.  At the time of discharge I have told her husband not to give her the statin till his CPK is checked next week and if it is trending down please restart her on statins.  Check a lipid panel CPK and a BMP in one 1 month after restarting the statins.  Home Health: Yes Equipment/Devices none  Discharge Condition stable and improved CODE STATUS full code Diet recommendation: Cardiac diet Brief/Interim Summary:  79 yo female with a history of type 2 diabetes, hypertension, TIA carotid artery stenosis status post left carotid endarterectomy in July 2020 restenosed status post stent in December 2020.  She lives with her husband at home.  She is now admitted with generalized weakness and lethargy. She is found to have low sodium likely secondary to decreased nutritional intake and HCTZ. Discussed with husband in detail.  Patient had Covid last month got her first dose of vaccine due for the second 1 in couple of weeks. She was not taking her simvastatin 40 mg full dose due to muscle weakness she kept taking only half of it since her LDL is 117 we have switched her to full dose and discussed with the husband that she must be taking the full dose simvastatin. Sodium is still low 122 we will continue normal saline. She is awake and alert and  answers all my questions appropriately. Husband and patient thinks she is better than yesterday her speech is better than yesterday though she has generalized weakness. Admitted with hyponatremia likely secondary to poor nutritional intake in the setting of HCTZ versus SIADH. She has an appointment with Dr. Kasandra Knudsen tomorrow I have sent a chat message to him.         ED Course: Severe HTN with SBPs in the 200s, Hyponatremia with sodium 120.  Pt given 500cc NS bolus and another portion of liter NS.  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  MRI brain neg for acute stroke.  Pt given labetalol, BP now 654Y systolic.  Patient with significantly improved aphasia and object naming at this time.  Discharge Diagnoses:  Principal Problem:   Acute metabolic encephalopathy Active Problems:   Expressive aphasia   Hypertensive urgency   DM type 2 (diabetes mellitus, type 2) (HCC)   Hyponatremia   Hypokalemia   #1 acute metabolic encephalopathy-secondary to severe hyponatremia.  She was treated with normal saline still with improvement in her sodium her mental status improved.  She was also seen in consultation by neurosurgery for a code stroke.  Patient did not have a stroke at this time.  #2 hypertensive urgency continue all the home medications including hydralazine.  Her blood pressure remained stable after the initial hypertensive urgency.  #3 hyponatremia please check her CPK BMP next week increase p.o.  intake.  Lexapro could cause hyponatremia but I am continuing that.  I have already stopped the Micardis.  You could restart ACE inhibitor as an outpatient if her blood pressure remains high. Patient was admitted with dangerously low levels of sodium at 120.  She was cautiously treated with a normal saline with multiple BMP checks daily.  This needed an inpatient stay to correct her hyponatremia.   She was also encephalopathic and confused at the time of admission with low sodium.  Her sodium improved faster than expected with normal saline and proper p.o. intake. Her symptoms resolved and she was back to baseline with normalization of her sodium.  #4 hypokalemia repleted  #5 type 2 diabetes continue Metformin  #6 history of TIA on aspirin and Plavix she needs to continue her statin however since her CPK is high am holding her statin she should recheck her blood work next week with the PCP as above and restart statin if CPKs trending down I have discussed with her husband multiple times.  I will also put her on coenzyme Q.    Nutrition Problem: Inadequate oral intake Etiology: poor appetite    Signs/Symptoms: per patient/family report     Interventions: Magic cup, Ensure Enlive (each supplement provides 350kcal and 20 grams of protein)  Estimated body mass index is 26.01 kg/m as calculated from the following:   Height as of this encounter: '5\' 3"'$  (1.6 m).   Weight as of this encounter: 66.6 kg.  Discharge Instructions  Discharge Instructions    Diet - low sodium heart healthy   Complete by: As directed    Increase activity slowly   Complete by: As directed      Allergies as of 09/16/2019      Reactions   5-alpha Reductase Inhibitors    Atorvastatin Other (See Comments)   Muscle pain   Lisinopril Cough      Sertraline Other (See Comments)   Caused headaches      Medication List    STOP taking these medications   simvastatin 40 MG tablet Commonly known as: ZOCOR   telmisartan-hydrochlorothiazide 80-25 MG tablet Commonly known as: MICARDIS HCT     TAKE these medications   aspirin EC 81 MG tablet Take 81 mg by mouth daily.   blood glucose meter kit and supplies Kit Dispense based on patient and insurance preference. Use up to four times daily as directed. (FOR ICD-9 250.00, 250.01).   clopidogrel 75 MG tablet Commonly known as: PLAVIX Take 1 tablet  (75 mg total) by mouth daily at 8 pm. What changed: when to take this   co-enzyme Q-10 50 MG capsule Take 1 capsule (50 mg total) by mouth daily.   escitalopram 10 MG tablet Commonly known as: LEXAPRO Take 10 mg by mouth daily.   hydrALAZINE 50 MG tablet Commonly known as: APRESOLINE Take 50 mg by mouth 2 (two) times daily.   Magnesium 250 MG Tabs Take 500 mg by mouth at bedtime as needed (constipation).   metFORMIN 500 MG tablet Commonly known as: GLUCOPHAGE Take 500 mg by mouth 2 (two) times daily.   metoprolol succinate 50 MG 24 hr tablet Commonly known as: TOPROL-XL Take 50 mg by mouth daily.   oxymetazoline 0.05 % nasal spray Commonly known as: AFRIN Place 1 spray into both nostrils 2 (two) times daily as needed for congestion.   TURMERIC PO Take 1,500 mg by mouth daily.   Vitamin D 50 MCG (2000 UT) tablet Take 2,000 Units by  mouth daily.      Follow-up Information    [facility] Follow up.   Specialty: Home Health Services Why: The home health agency will contact you for the first home visit. Contact information: [address] [place] [address] [phone]        Kristopher Glee., MD Follow up.   Specialty: Internal Medicine Why: Dr. Karle Starch please check her CPK and BMP in 1 week.  She had hyponatremia and elevated CPK.  Currently we are holding her statin due to high CPK.  Please restart if her CPKs are trending down.  Check her lipid panel in 1 to 2 months after restarting stati Contact information: [address] [place] [address] [phone]          Allergies  Allergen Reactions  . 5-Alpha Reductase Inhibitors   . Atorvastatin Other (See Comments)    Muscle pain  . Lisinopril Cough       . Sertraline Other (See Comments)    Caused headaches     Consultations: Neurology Procedures/Studies: CT Code Stroke CTA Head W/WO contrast  Result Date: 09/14/2019 CLINICAL DATA:  Code stroke.  Pure aphasia.  EXAM: CT HEAD WITHOUT CONTRAST CT ANGIOGRAPHY OF THE HEAD AND NECK TECHNIQUE: Contiguous axial images were obtained from the base of the skull through the vertex without intravenous contrast. Multidetector CT imaging of the head and neck was performed using the standard protocol during bolus administration of intravenous contrast. Multiplanar CT image reconstructions and MIPs were obtained to evaluate the vascular anatomy. Carotid stenosis measurements (when applicable) are obtained utilizing NASCET criteria, using the distal internal carotid diameter as the denominator. COMPARISON:  07/11/2019 FINDINGS: CT HEAD FINDINGS Brain: There is no mass, hemorrhage or extra-axial collection. There is hypoattenuation of the periventricular white matter, most commonly indicating chronic ischemic microangiopathy. Vascular: No abnormal hyperdensity of the major intracranial arteries or dural venous sinuses. No intracranial atherosclerosis. Skull: The visualized skull base, calvarium and extracranial soft tissues are normal. Sinuses/Orbits: No fluid levels or advanced mucosal thickening of the visualized paranasal sinuses. No mastoid or middle ear effusion. The orbits are normal. ASPECTS ([facility] Stroke Program Early CT Score) - Ganglionic level infarction (caudate, lentiform nuclei, internal capsule, insula, M1-M3 cortex): 7 - Supraganglionic infarction (M4-M6 cortex): 3 Total score (0-10 with 10 being normal): 10 CTA NECK FINDINGS SKELETON: There is no bony spinal canal stenosis. No lytic or blastic lesion. OTHER NECK: Normal pharynx, larynx and major salivary glands. No cervical lymphadenopathy. Unremarkable thyroid gland. UPPER CHEST: No pneumothorax or pleural effusion. No nodules or masses. AORTIC ARCH: There is mild calcific atherosclerosis of the aortic arch. There is no aneurysm, dissection or hemodynamically significant stenosis of the visualized portion of the aorta. Conventional 3 vessel aortic branching pattern. The  visualized proximal subclavian arteries are widely patent. RIGHT CAROTID SYSTEM: No dissection, occlusion or aneurysm. There is mixed density atherosclerosis extending into the proximal ICA, resulting in 54% stenosis. LEFT CAROTID SYSTEM: Interval left carotid endarterectomy, widely patent. VERTEBRAL ARTERIES: Left dominant configuration. Both origins are clearly patent. Moderate multifocal atherosclerosis of the left V3 segment, unchanged. CTA HEAD FINDINGS POSTERIOR CIRCULATION: --Vertebral arteries: Atherosclerotic calcification of both V4 segments without high-grade stenosis. --Posterior inferior cerebellar arteries (PICA): Patent origins from the vertebral arteries. --Anterior inferior cerebellar arteries (AICA): Patent origins from the basilar artery. --Basilar artery: Short segment occlusion or high-grade stenosis of the basilar artery is unchanged compared to 07/11/2019. --Superior cerebellar arteries: Normal. --Posterior cerebral arteries:  Normal. The left PCA is partially supplied by a posterior communicating artery (p-comm). Right P-comm is diminutive or absent. ANTERIOR CIRCULATION: --Intracranial internal carotid arteries: Unchanged bilateral atherosclerotic calcification of both internal carotid arteries at the skull base. Moderate stenosis is unchanged. --Anterior cerebral arteries (ACA): Normal. Hypoplastic right A1 segment, normal variant. --Middle cerebral arteries (MCA): Severe stenosis of the proximal right M1 segment is unchanged. There is mild narrowing of the distal left M1 segment. Distal branches are patent. VENOUS SINUSES: As permitted by contrast timing, patent. ANATOMIC VARIANTS: None Review of the MIP images confirms the above findings. IMPRESSION: 1. No emergent large vessel occlusion. 2. 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 3. 54% stenosis of the proximal right internal carotid artery, unchanged. 4. Status post left carotid endarterectomy/stent, widely patent.  5. Unchanged severe mid basilar stenosis. These results were called by telephone at the time of interpretation on 09/14/2019 at 10:33 pm to provider MCNEILL [facility] , who verbally acknowledged these results. Electronically Signed   By: Ulyses Jarred M.D.   On: 09/14/2019 22:47   CT Code Stroke CTA Neck W/WO contrast  Result Date: 09/14/2019 CLINICAL DATA:  Code stroke.  Pure aphasia. EXAM: CT HEAD WITHOUT CONTRAST CT ANGIOGRAPHY OF THE HEAD AND NECK TECHNIQUE: Contiguous axial images were obtained from the base of the skull through the vertex without intravenous contrast. Multidetector CT imaging of the head and neck was performed using the standard protocol during bolus administration of intravenous contrast. Multiplanar CT image reconstructions and MIPs were obtained to evaluate the vascular anatomy. Carotid stenosis measurements (when applicable) are obtained utilizing NASCET criteria, using the distal internal carotid diameter as the denominator. COMPARISON:  07/11/2019 FINDINGS: CT HEAD FINDINGS Brain: There is no mass, hemorrhage or extra-axial collection. There is hypoattenuation of the periventricular white matter, most commonly indicating chronic ischemic microangiopathy. Vascular: No abnormal hyperdensity of the major intracranial arteries or dural venous sinuses. No intracranial atherosclerosis. Skull: The visualized skull base, calvarium and extracranial soft tissues are normal. Sinuses/Orbits: No fluid levels or advanced mucosal thickening of the visualized paranasal sinuses. No mastoid or middle ear effusion. The orbits are normal. ASPECTS ([facility] Stroke Program Early CT Score) - Ganglionic level infarction (caudate, lentiform nuclei, internal capsule, insula, M1-M3 cortex): 7 - Supraganglionic infarction (M4-M6 cortex): 3 Total score (0-10 with 10 being normal): 10 CTA NECK FINDINGS SKELETON: There is no bony spinal canal stenosis. No lytic or blastic lesion. OTHER NECK: Normal pharynx, larynx  and major salivary glands. No cervical lymphadenopathy. Unremarkable thyroid gland. UPPER CHEST: No pneumothorax or pleural effusion. No nodules or masses. AORTIC ARCH: There is mild calcific atherosclerosis of the aortic arch. There is no aneurysm, dissection or hemodynamically significant stenosis of the visualized portion of the aorta. Conventional 3 vessel aortic branching pattern. The visualized proximal subclavian arteries are widely patent. RIGHT CAROTID SYSTEM: No dissection, occlusion or aneurysm. There is mixed density atherosclerosis extending into the proximal ICA, resulting in 54% stenosis. LEFT CAROTID SYSTEM: Interval left carotid endarterectomy, widely patent. VERTEBRAL ARTERIES: Left dominant configuration. Both origins are clearly patent. Moderate multifocal atherosclerosis of the left V3 segment, unchanged. CTA HEAD FINDINGS POSTERIOR CIRCULATION: --Vertebral arteries: Atherosclerotic calcification of both V4 segments without high-grade stenosis. --Posterior inferior cerebellar arteries (PICA): Patent origins from the vertebral arteries. --Anterior inferior cerebellar arteries (AICA): Patent origins from the basilar artery. --Basilar artery: Short segment occlusion or high-grade stenosis of the basilar artery is unchanged compared to 07/11/2019. --Superior cerebellar arteries: Normal. --Posterior cerebral arteries: Normal.  The left PCA is partially supplied by a posterior communicating artery (p-comm). Right P-comm is diminutive or absent. ANTERIOR CIRCULATION: --Intracranial internal carotid arteries: Unchanged bilateral atherosclerotic calcification of both internal carotid arteries at the skull base. Moderate stenosis is unchanged. --Anterior cerebral arteries (ACA): Normal. Hypoplastic right A1 segment, normal variant. --Middle cerebral arteries (MCA): Severe stenosis of the proximal right M1 segment is unchanged. There is mild narrowing of the distal left M1 segment. Distal branches are  patent. VENOUS SINUSES: As permitted by contrast timing, patent. ANATOMIC VARIANTS: None Review of the MIP images confirms the above findings. IMPRESSION: 1. No emergent large vessel occlusion. 2. 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 3. 54% stenosis of the proximal right internal carotid artery, unchanged. 4. Status post left carotid endarterectomy/stent, widely patent. 5. Unchanged severe mid basilar stenosis. These results were called by telephone at the time of interpretation on 09/14/2019 at 10:33 pm to provider MCNEILL [facility] , who verbally acknowledged these results. Electronically Signed   By: Ulyses Jarred M.D.   On: 09/14/2019 22:47   MR BRAIN WO CONTRAST  Result Date: 09/14/2019 CLINICAL DATA:  Stroke follow-up EXAM: MRI HEAD WITHOUT CONTRAST TECHNIQUE: Multiplanar, multiecho pulse sequences of the brain and surrounding structures were obtained without intravenous contrast. COMPARISON:  02/13/2019 brain MRI FINDINGS: BRAIN: No acute infarct, acute hemorrhage or extra-axial collection. Early confluent hyperintense T2-weighted signal of the periventricular and deep white matter, most commonly due to chronic ischemic microangiopathy. Normal volume of brain parenchyma and CSF spaces. Midline structures are normal. VASCULAR: Major flow voids are preserved. Susceptibility-sensitive sequences show no chronic microhemorrhage or superficial siderosis. SKULL AND UPPER CERVICAL SPINE: Normal calvarium and skull base. Visualized upper cervical spine and soft tissues are normal. SINUSES/ORBITS: No fluid levels or advanced mucosal thickening. No mastoid or middle ear effusion. Normal orbits. Other: None. IMPRESSION: 1. No acute intracranial abnormality. 2. Findings of chronic small vessel ischemia. Electronically Signed   By: Ulyses Jarred M.D.   On: 09/14/2019 23:25   DG CHEST PORT 1 VIEW  Result Date: 09/15/2019 CLINICAL DATA:  History of COVID-19 positivity, recent confusion and  aphasia EXAM: PORTABLE CHEST 1 VIEW COMPARISON:  09/07/2018 FINDINGS: Cardiac shadow is enlarged. Aortic calcifications are seen. No focal infiltrate is noted. No acute bony abnormality is seen. IMPRESSION: No active disease. Electronically Signed   By: Inez Catalina M.D.   On: 09/15/2019 00:51   CT HEAD CODE STROKE WO CONTRAST  Result Date: 09/14/2019 CLINICAL DATA:  Code stroke.  Pure aphasia. EXAM: CT HEAD WITHOUT CONTRAST CT ANGIOGRAPHY OF THE HEAD AND NECK TECHNIQUE: Contiguous axial images were obtained from the base of the skull through the vertex without intravenous contrast. Multidetector CT imaging of the head and neck was performed using the standard protocol during bolus administration of intravenous contrast. Multiplanar CT image reconstructions and MIPs were obtained to evaluate the vascular anatomy. Carotid stenosis measurements (when applicable) are obtained utilizing NASCET criteria, using the distal internal carotid diameter as the denominator. COMPARISON:  07/11/2019 FINDINGS: CT HEAD FINDINGS Brain: There is no mass, hemorrhage or extra-axial collection. There is hypoattenuation of the periventricular white matter, most commonly indicating chronic ischemic microangiopathy. Vascular: No abnormal hyperdensity of the major intracranial arteries or dural venous sinuses. No intracranial atherosclerosis. Skull: The visualized skull base, calvarium and extracranial soft tissues are normal. Sinuses/Orbits: No fluid levels or advanced mucosal thickening of the visualized paranasal sinuses. No mastoid or middle ear effusion. The orbits are normal. ASPECTS [facility] Stroke  Program Early CT Score) - Ganglionic level infarction (caudate, lentiform nuclei, internal capsule, insula, M1-M3 cortex): 7 - Supraganglionic infarction (M4-M6 cortex): 3 Total score (0-10 with 10 being normal): 10 CTA NECK FINDINGS SKELETON: There is no bony spinal canal stenosis. No lytic or blastic lesion. OTHER NECK: Normal pharynx,  larynx and major salivary glands. No cervical lymphadenopathy. Unremarkable thyroid gland. UPPER CHEST: No pneumothorax or pleural effusion. No nodules or masses. AORTIC ARCH: There is mild calcific atherosclerosis of the aortic arch. There is no aneurysm, dissection or hemodynamically significant stenosis of the visualized portion of the aorta. Conventional 3 vessel aortic branching pattern. The visualized proximal subclavian arteries are widely patent. RIGHT CAROTID SYSTEM: No dissection, occlusion or aneurysm. There is mixed density atherosclerosis extending into the proximal ICA, resulting in 54% stenosis. LEFT CAROTID SYSTEM: Interval left carotid endarterectomy, widely patent. VERTEBRAL ARTERIES: Left dominant configuration. Both origins are clearly patent. Moderate multifocal atherosclerosis of the left V3 segment, unchanged. CTA HEAD FINDINGS POSTERIOR CIRCULATION: --Vertebral arteries: Atherosclerotic calcification of both V4 segments without high-grade stenosis. --Posterior inferior cerebellar arteries (PICA): Patent origins from the vertebral arteries. --Anterior inferior cerebellar arteries (AICA): Patent origins from the basilar artery. --Basilar artery: Short segment occlusion or high-grade stenosis of the basilar artery is unchanged compared to 07/11/2019. --Superior cerebellar arteries: Normal. --Posterior cerebral arteries: Normal. The left PCA is partially supplied by a posterior communicating artery (p-comm). Right P-comm is diminutive or absent. ANTERIOR CIRCULATION: --Intracranial internal carotid arteries: Unchanged bilateral atherosclerotic calcification of both internal carotid arteries at the skull base. Moderate stenosis is unchanged. --Anterior cerebral arteries (ACA): Normal. Hypoplastic right A1 segment, normal variant. --Middle cerebral arteries (MCA): Severe stenosis of the proximal right M1 segment is unchanged. There is mild narrowing of the distal left M1 segment. Distal branches  are patent. VENOUS SINUSES: As permitted by contrast timing, patent. ANATOMIC VARIANTS: None Review of the MIP images confirms the above findings. IMPRESSION: 1. No emergent large vessel occlusion. 2. 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 3. 54% stenosis of the proximal right internal carotid artery, unchanged. 4. Status post left carotid endarterectomy/stent, widely patent. 5. Unchanged severe mid basilar stenosis. These results were called by telephone at the time of interpretation on 09/14/2019 at 10:33 pm to provider MCNEILL [facility] , who verbally acknowledged these results. Electronically Signed   By: Ulyses Jarred M.D.   On: 09/14/2019 22:47    (Echo, Carotid, EGD, Colonoscopy, ERCP)    Subjective: Patient resting in bed awake alert anxious to go home discussed with husband  Discharge Exam: Vitals:   09/16/19 0910 09/16/19 1223  BP: (!) 144/45 (!) 124/37  Pulse: 70 84  Resp: 16 18  Temp: 97.7 F (36.5 C) 98.9 F (37.2 C)  SpO2: 96% 98%   Vitals:   09/15/19 2309 09/16/19 0411 09/16/19 0910 09/16/19 1223  BP: (!) 134/50 (!) 159/49 (!) 144/45 (!) 124/37  Pulse: 73 74 70 84  Resp: 18 17 16 18   Temp: 98.4 F (36.9 C) 98.3 F (36.8 C) 97.7 F (36.5 C) 98.9 F (37.2 C)  TempSrc: Oral Oral Oral Oral  SpO2: 97% 98% 96% 98%  Weight:      Height:        General: Pt is alert, awake, not in acute distress Cardiovascular: RRR, S1/S2 +, no rubs, no gallops Respiratory: CTA bilaterally, no wheezing, no rhonchi Abdominal: Soft, NT, ND, bowel sounds + Extremities: no edema, no cyanosis    The results of significant diagnostics from  this hospitalization (including imaging, microbiology, ancillary and laboratory) are listed below for reference.     Microbiology: Recent Results (from the past 240 hour(s))  SARS CORONAVIRUS 2 (TAT 6-24 HRS) Nasopharyngeal Nasopharyngeal Swab     Status: Abnormal   Collection Time: 09/14/19 11:41 PM   Specimen:  Nasopharyngeal Swab  Result Value Ref Range Status   SARS Coronavirus 2 POSITIVE (A) NEGATIVE Final    Comment: RESULT CALLED TO, READ BACK BY AND VERIFIED WITH: B. Assimos RN 9:35 09/15/19 ([email or web address]) (NOTE) SARS-CoV-2 target nucleic acids are DETECTED. The SARS-CoV-2 RNA is generally detectable in upper and lower respiratory specimens during the acute phase of infection. Positive results are indicative of the presence of SARS-CoV-2 RNA. Clinical correlation with patient history and other diagnostic information is  necessary to determine patient infection status. Positive results do not rule out bacterial infection or co-infection with other viruses.  The expected result is Negative. Fact Sheet for Patients: SugarRoll.be Fact Sheet for Healthcare Providers: https://www.woods-Paolina Karwowski.com/ This test is not yet approved or cleared by the [place] FDA and  has been authorized for detection and/or diagnosis of SARS-CoV-2 by FDA under an Emergency Use Authorization (EUA). This EUA will remain  in effect (meaning this test can be used) for the  duration of the COVID-19 declaration under Section 564(b)(1) of the Act, 21 U.S.C. section 360bbb-3(b)(1), unless the authorization is terminated or revoked sooner. Performed at [facility], [place] [address] [place], [place] [address]      Labs: BNP (last 3 results) No results for input(s): BNP in the last 8760 hours. Basic Metabolic Panel: Recent Labs  Lab 09/15/19 0536 09/15/19 1113 09/15/19 1758 09/15/19 2239 09/16/19 0936  NA 123* 122* 123* 126* 135  K 3.0* 3.4* 4.5 4.3 4.0  CL 83* 85* 87* 92* 101  CO2 25 23 24 24 23   GLUCOSE 134* 128* 138* 125* 204*  BUN 9 9 12 12  7*  CREATININE 0.42* 0.45 0.52 0.50 0.45  CALCIUM 8.8* 8.8* 9.5 9.5 9.2   Liver Function Tests: Recent Labs  Lab 09/14/19 2155  AST 81*  ALT 120*  ALKPHOS 50  BILITOT 1.2  PROT 7.2  ALBUMIN 3.9   No results for  input(s): LIPASE, AMYLASE in the last 168 hours. No results for input(s): AMMONIA in the last 168 hours. CBC: Recent Labs  Lab 09/14/19 2155 09/14/19 2158  WBC 13.3*  --   NEUTROABS 9.9*  --   HGB 13.2 13.6  HCT 37.1 40.0  MCV 79.6*  --   PLT 280  --    Cardiac Enzymes: Recent Labs  Lab 09/15/19 1113  CKTOTAL 1,454*   BNP: Invalid input(s): POCBNP CBG: Recent Labs  Lab 09/15/19 2129 09/16/19 0632 09/16/19 0913 09/16/19 0915 09/16/19 1225  GLUCAP 129* 127* 186* 179* 179*   D-Dimer No results for input(s): DDIMER in the last 72 hours. Hgb A1c No results for input(s): HGBA1C in the last 72 hours. Lipid Profile No results for input(s): CHOL, HDL, LDLCALC, TRIG, CHOLHDL, LDLDIRECT in the last 72 hours. Thyroid function studies Recent Labs    09/15/19 0120  TSH 2.126   Anemia work up No results for input(s): VITAMINB12, FOLATE, FERRITIN, TIBC, IRON, RETICCTPCT in the last 72 hours. Urinalysis    Component Value Date/Time   COLORURINE STRAW (A) 09/14/2019 2155   APPEARANCEUR CLEAR 09/14/2019 2155   LABSPEC 1.019 09/14/2019 2155   PHURINE 8.0 09/14/2019 2155   GLUCOSEU 50 (A) 09/14/2019 2155   HGBUR SMALL (  A) 09/14/2019 2155   BILIRUBINUR NEGATIVE 09/14/2019 2155   KETONESUR 20 (A) 09/14/2019 2155   PROTEINUR NEGATIVE 09/14/2019 2155   NITRITE NEGATIVE 09/14/2019 2155   LEUKOCYTESUR SMALL (A) 09/14/2019 2155   Sepsis Labs Invalid input(s): PROCALCITONIN,  WBC,  LACTICIDVEN Microbiology Recent Results (from the past 240 hour(s))  SARS CORONAVIRUS 2 (TAT 6-24 HRS) Nasopharyngeal Nasopharyngeal Swab     Status: Abnormal   Collection Time: 09/14/19 11:41 PM   Specimen: Nasopharyngeal Swab  Result Value Ref Range Status   SARS Coronavirus 2 POSITIVE (A) NEGATIVE Final    Comment: RESULT CALLED TO, READ BACK BY AND VERIFIED WITH: B. Assimos RN 9:35 09/15/19 ([email or web address]) (NOTE) SARS-CoV-2 target nucleic acids are DETECTED. The SARS-CoV-2 RNA is generally  detectable in upper and lower respiratory specimens during the acute phase of infection. Positive results are indicative of the presence of SARS-CoV-2 RNA. Clinical correlation with patient history and other diagnostic information is  necessary to determine patient infection status. Positive results do not rule out bacterial infection or co-infection with other viruses.  The expected result is Negative. Fact Sheet for Patients: SugarRoll.be Fact Sheet for Healthcare Providers: https://www.woods-Harshil Cavallaro.com/ This test is not yet approved or cleared by the [place] FDA and  has been authorized for detection and/or diagnosis of SARS-CoV-2 by FDA under an Emergency Use Authorization (EUA). This EUA will remain  in effect (meaning this test can be used) for the  duration of the COVID-19 declaration under Section 564(b)(1) of the Act, 21 U.S.C. section 360bbb-3(b)(1), unless the authorization is terminated or revoked sooner. Performed at [facility], [place] [address], [place], [place] [address]      Time coordinating discharge: 38 minutes  SIGNED:   Georgette Shell, MD  [facility] 09/16/2019, 12:30 PM Pager   If 7PM-7AM, please contact night-coverage [email or web address] Password [email or web address]

## 2019-09-16 NOTE — Progress Notes (Signed)
Physical Therapy Treatment Patient Details Name: Zarai Orsborn MRN: [id number] DOB: 01-Dec-1940 Today's Date: 09/16/2019    History of Present Illness 79 y.o. female with medical history significant of HTN, DM2, TIA carotid artery stenosis s/p L CEA in July 2020, restenosis s/p stent in Dec 2020. H/o HTN urgency vs TIA believed to be causing aphasia and stroke like symptoms in July last year as well. Patient presents to the ED as a code stroke secondary to aphasia and confusion. Pt admitted for hyponatremia.    PT Comments    Pt very pleasant and eager to get up from the chair today. Pt able to increase gait tolerance and perform HEP today with education for walking program, HEP and increased activity at home. Pt encouraged to continue RW use particularly after posterior LOB with stair trial and spouse present to confirm he always assists with stairs. Will continue to follow acutely.    Follow Up Recommendations  Home health PT;Supervision for mobility/OOB     Equipment Recommendations  None recommended by PT    Recommendations for Other Services       Precautions / Restrictions Precautions Precautions: Fall    Mobility  Bed Mobility               General bed mobility comments: in chair on arrival and end of session  Transfers Overall transfer level: Needs assistance   Transfers: Sit to/from Stand Sit to Stand: Supervision         General transfer comment: pt able to stand from chair and toilet with cues for hand placement at toilet and assist for lines. Pt performed 10 repeated sit<>stand end of session at chair <60sec  Ambulation/Gait Ambulation/Gait assistance: Min guard Gait Distance (Feet): 450 Feet Assistive device: Rolling walker (2 wheeled) Gait Pattern/deviations: Step-through pattern;Decreased stride length   Gait velocity interpretation: >4.37 ft/sec, indicative of normal walking speed General Gait Details: pt with good stability and speed with use  of RW. Pt reports fatigue end of gait but tolerating improved distance from last session   Stairs Stairs: Yes Stairs assistance: Min assist Stair Management: Two rails;Step to pattern;Forwards Number of Stairs: 4 General stair comments: pt performed 2 stairs well and on repeated 2 steps pt with posterior LOB with min assist to correct during ascent, no difficulty during descent   Wheelchair Mobility    Modified Rankin (Stroke Patients Only)       Balance     Sitting balance-Leahy Scale: Good     Standing balance support: Bilateral upper extremity supported Standing balance-Leahy Scale: Fair Standing balance comment: able to stand at sink without UE support, RW for gait                            Cognition Arousal/Alertness: Awake/alert Behavior During Therapy: WFL for tasks assessed/performed Overall Cognitive Status: Impaired/Different from baseline Area of Impairment: Safety/judgement                         Safety/Judgement: Decreased awareness of deficits            Exercises General Exercises - Lower Extremity Long Arc Quad: AROM;Both;Seated;15 reps Hip ABduction/ADduction: AROM;Both;Seated;15 reps Hip Flexion/Marching: AROM;Both;Seated;15 reps Other Exercises Other Exercises: 10 sit to stand without UE support from chair    General Comments        Pertinent Vitals/Pain Pain Assessment: No/denies pain    Home Living  Prior Function            PT Goals (current goals can now be found in the care plan section) Progress towards PT goals: Progressing toward goals    Frequency           PT Plan Current plan remains appropriate    Co-evaluation              AM-PAC PT "6 Clicks" Mobility   Outcome Measure  Help needed turning from your back to your side while in a flat bed without using bedrails?: None Help needed moving from lying on your back to sitting on the side of a flat bed  without using bedrails?: None Help needed moving to and from a bed to a chair (including a wheelchair)?: None Help needed standing up from a chair using your arms (e.g., wheelchair or bedside chair)?: None Help needed to walk in hospital room?: A Little Help needed climbing 3-5 steps with a railing? : A Little 6 Click Score: 22    End of Session   Activity Tolerance: Patient tolerated treatment well Patient left: in chair;with call bell/phone within reach;with family/visitor present Nurse Communication: Mobility status PT Visit Diagnosis: Unsteadiness on feet (R26.81);Muscle weakness (generalized) (M62.81)     Time: 5374-8270 PT Time Calculation (min) (ACUTE ONLY): 27 min  Charges:  $Gait Training: 8-22 mins $Therapeutic Exercise: 8-22 mins                     Modesta Sammons P, PT Acute Rehabilitation Services Pager: [phone] Office: [place] 09/16/2019, 1:45 PM

## 2019-09-16 NOTE — TOC Transition Note (Signed)
Transition of Care [facility]) - CM/SW Discharge Note   Patient Details  Name: Margaret Butler MRN: [id number] Date of Birth: 05-24-1941  Transition of Care [facility]) CM/SW Contact:  Kermit Balo, RN Phone Number: 09/16/2019, 1:26 PM   Clinical Narrative:    Pt discharging home with [facility] services through [facility]. She asked about using [facility] but they are not in network with her insurance. Tiffany with [facility] has accepted the referral.  Pt has: walker, shower seat, 3 in 1 at home. She denies issues with home medications or transportation.  Pt has transportation home.    Final next level of care: Home w Home Health Services Barriers to Discharge: No Barriers Identified   Patient Goals and CMS Choice   CMS Medicare.gov Compare Post Acute Care list provided to:: Patient Choice offered to / list presented to : Patient  Discharge Placement                       Discharge Plan and Services                          HH Arranged: PT [facility] Agency: [facility] Date [facility] Agency Contacted: 09/16/19   Representative spoke with at [facility] Agency: Tiffany  Social Determinants of Health (SDOH) Interventions     Readmission Risk Interventions Readmission Risk Prevention Plan 07/21/2019 07/21/2019  Post Dischage Appt Not Complete Complete  Appt Comments f/u with [facility] in 4  weeks -  Medication Screening - Complete  Transportation Screening - Complete

## 2019-09-16 NOTE — Progress Notes (Signed)
Pt being discharged home, transportation provided by spouse. Educated patient and spouse on discharge instructions, questions answered.

## 2021-11-25 IMAGING — DX DG CHEST 1V PORT
1 series · 1 of 1 positions shown · non-contrast
Comparison: 09/07/2018

CLINICAL DATA: History of UEC4W-S2 positivity, recent confusion and
aphasia

EXAM:
PORTABLE CHEST 1 VIEW

[chest]
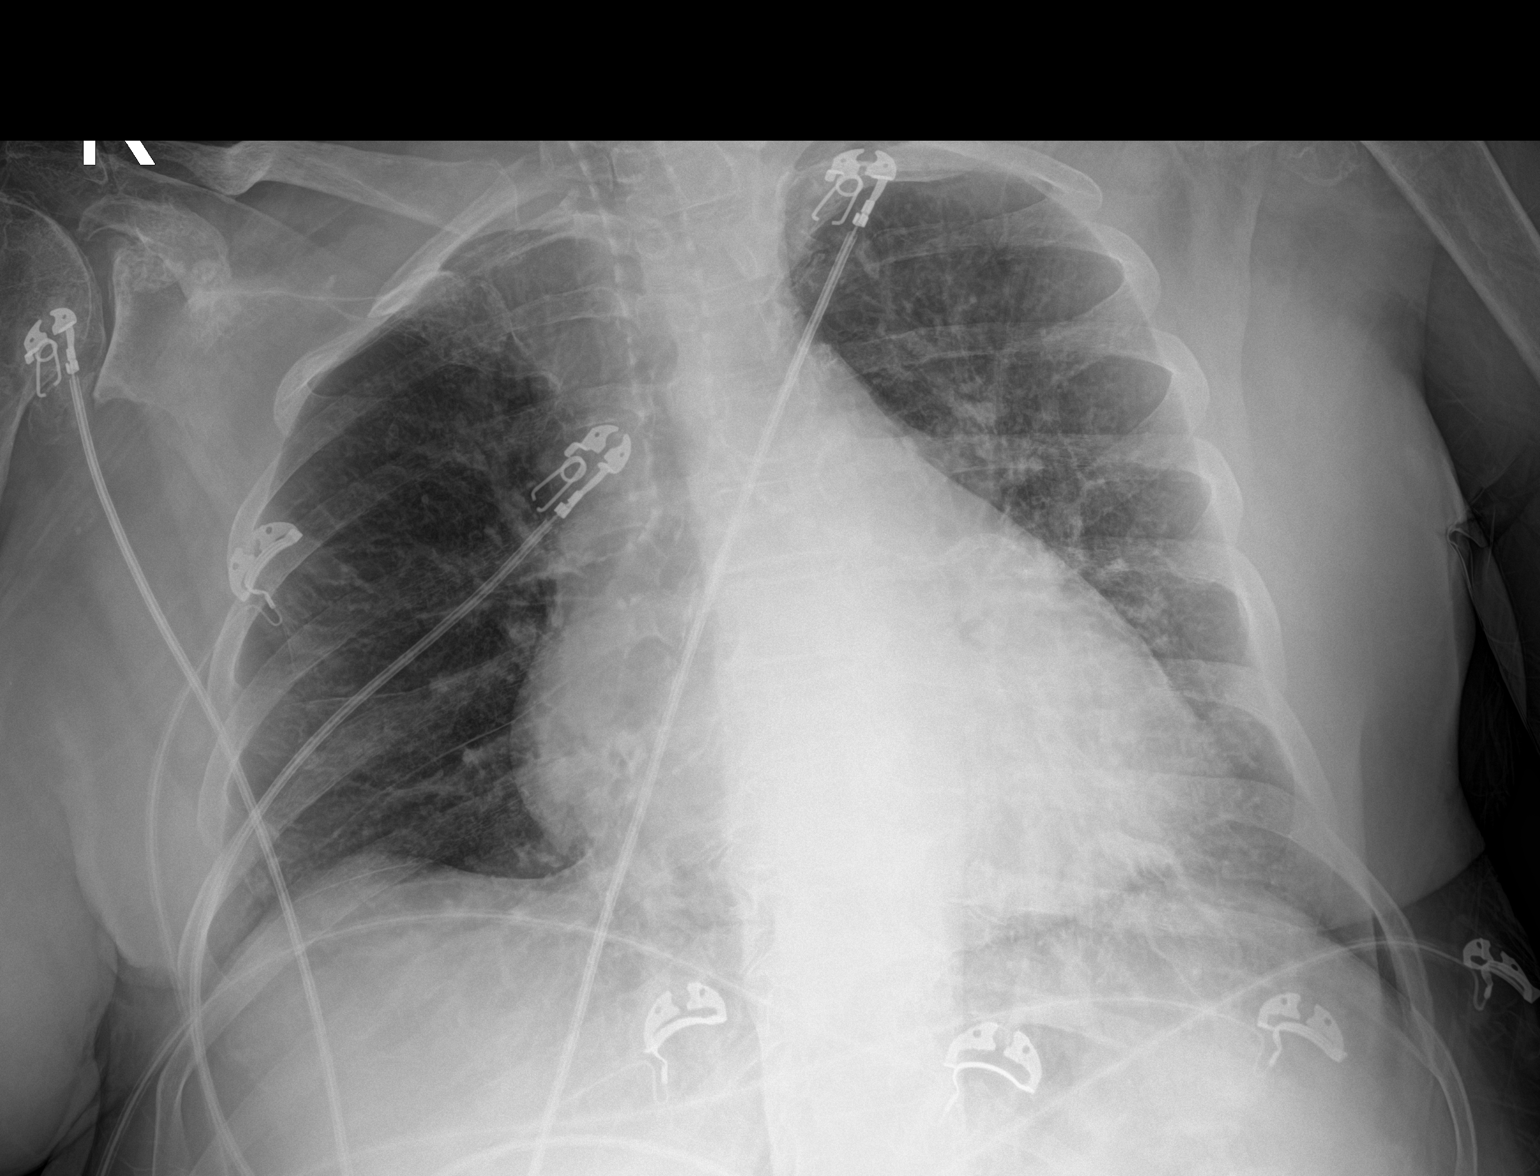

[1 of 1 positions shown; findings below may reference images not displayed]

FINDINGS: Cardiac shadow is enlarged. Aortic calcifications are seen. No focal
infiltrate is noted. No acute bony abnormality is seen.
IMPRESSION: No active disease.

## 2022-05-15 ENCOUNTER — Inpatient Hospital Stay (HOSPITAL_COMMUNITY): Payer: Medicare PPO

## 2022-05-15 ENCOUNTER — Emergency Department (HOSPITAL_COMMUNITY): Payer: Medicare PPO

## 2022-05-15 ENCOUNTER — Encounter (HOSPITAL_COMMUNITY): Payer: Self-pay | Admitting: Student in an Organized Health Care Education/Training Program

## 2022-05-15 ENCOUNTER — Inpatient Hospital Stay (HOSPITAL_COMMUNITY)
Admission: EM | Admit: 2022-05-15 | Discharge: 2022-05-21 | DRG: 035 | Disposition: A | Discharge status: Home / Self Care | Payer: Medicare PPO | Attending: Neurology | Admitting: Neurology

## 2022-05-15 ENCOUNTER — Other Ambulatory Visit: Payer: Self-pay

## 2022-05-15 DIAGNOSIS — I639 Cerebral infarction, unspecified: Secondary | ICD-10-CM | POA: Diagnosis not present

## 2022-05-15 DIAGNOSIS — G8104 Flaccid hemiplegia affecting left nondominant side: Secondary | ICD-10-CM | POA: Diagnosis present

## 2022-05-15 DIAGNOSIS — R29701 NIHSS score 1: Secondary | ICD-10-CM | POA: Diagnosis present

## 2022-05-15 DIAGNOSIS — E785 Hyperlipidemia, unspecified: Secondary | ICD-10-CM | POA: Diagnosis present

## 2022-05-15 DIAGNOSIS — F319 Bipolar disorder, unspecified: Secondary | ICD-10-CM | POA: Diagnosis present

## 2022-05-15 DIAGNOSIS — R414 Neurologic neglect syndrome: Secondary | ICD-10-CM | POA: Diagnosis present

## 2022-05-15 DIAGNOSIS — Z79899 Other long term (current) drug therapy: Secondary | ICD-10-CM

## 2022-05-15 DIAGNOSIS — E1151 Type 2 diabetes mellitus with diabetic peripheral angiopathy without gangrene: Secondary | ICD-10-CM | POA: Diagnosis present

## 2022-05-15 DIAGNOSIS — I2510 Atherosclerotic heart disease of native coronary artery without angina pectoris: Secondary | ICD-10-CM | POA: Diagnosis present

## 2022-05-15 DIAGNOSIS — Z7984 Long term (current) use of oral hypoglycemic drugs: Secondary | ICD-10-CM | POA: Diagnosis not present

## 2022-05-15 DIAGNOSIS — I63431 Cerebral infarction due to embolism of right posterior cerebral artery: Secondary | ICD-10-CM | POA: Diagnosis present

## 2022-05-15 DIAGNOSIS — Z888 Allergy status to other drugs, medicaments and biological substances status: Secondary | ICD-10-CM | POA: Diagnosis not present

## 2022-05-15 DIAGNOSIS — Z7982 Long term (current) use of aspirin: Secondary | ICD-10-CM | POA: Diagnosis not present

## 2022-05-15 DIAGNOSIS — K219 Gastro-esophageal reflux disease without esophagitis: Secondary | ICD-10-CM | POA: Diagnosis present

## 2022-05-15 DIAGNOSIS — Z8249 Family history of ischemic heart disease and other diseases of the circulatory system: Secondary | ICD-10-CM

## 2022-05-15 DIAGNOSIS — Z955 Presence of coronary angioplasty implant and graft: Secondary | ICD-10-CM

## 2022-05-15 DIAGNOSIS — H5340 Unspecified visual field defects: Secondary | ICD-10-CM | POA: Diagnosis present

## 2022-05-15 DIAGNOSIS — I6521 Occlusion and stenosis of right carotid artery: Secondary | ICD-10-CM | POA: Diagnosis present

## 2022-05-15 DIAGNOSIS — I10 Essential (primary) hypertension: Secondary | ICD-10-CM | POA: Diagnosis present

## 2022-05-15 DIAGNOSIS — Z8673 Personal history of transient ischemic attack (TIA), and cerebral infarction without residual deficits: Secondary | ICD-10-CM | POA: Diagnosis not present

## 2022-05-15 DIAGNOSIS — F32A Depression, unspecified: Secondary | ICD-10-CM

## 2022-05-15 DIAGNOSIS — Z7902 Long term (current) use of antithrombotics/antiplatelets: Secondary | ICD-10-CM

## 2022-05-15 DIAGNOSIS — R4701 Aphasia: Secondary | ICD-10-CM | POA: Diagnosis present

## 2022-05-15 DIAGNOSIS — R29810 Facial weakness: Secondary | ICD-10-CM | POA: Diagnosis present

## 2022-05-15 DIAGNOSIS — Z1152 Encounter for screening for COVID-19: Secondary | ICD-10-CM

## 2022-05-15 DIAGNOSIS — Z96651 Presence of right artificial knee joint: Secondary | ICD-10-CM | POA: Diagnosis present

## 2022-05-15 DIAGNOSIS — I6389 Other cerebral infarction: Secondary | ICD-10-CM | POA: Diagnosis not present

## 2022-05-15 DIAGNOSIS — I6522 Occlusion and stenosis of left carotid artery: Secondary | ICD-10-CM | POA: Diagnosis present

## 2022-05-15 DIAGNOSIS — Z9889 Other specified postprocedural states: Secondary | ICD-10-CM | POA: Diagnosis not present

## 2022-05-15 LAB — DIFFERENTIAL
Abs Immature Granulocytes: 0.06 10*3/uL (ref 0.00–0.07)
Basophils Absolute: 0.1 10*3/uL (ref 0.0–0.1)
Basophils Relative: 1 %
Eosinophils Absolute: 0.1 10*3/uL (ref 0.0–0.5)
Eosinophils Relative: 1 %
Immature Granulocytes: 1 %
Lymphocytes Relative: 16 %
Lymphs Abs: 1.9 10*3/uL (ref 0.7–4.0)
Monocytes Absolute: 0.9 10*3/uL (ref 0.1–1.0)
Monocytes Relative: 7 %
Neutro Abs: 9 10*3/uL — ABNORMAL HIGH (ref 1.7–7.7)
Neutrophils Relative %: 74 %

## 2022-05-15 LAB — URINALYSIS, ROUTINE W REFLEX MICROSCOPIC
Bilirubin Urine: NEGATIVE
Glucose, UA: NEGATIVE mg/dL
Hgb urine dipstick: NEGATIVE
Ketones, ur: NEGATIVE mg/dL
Leukocytes,Ua: NEGATIVE
Nitrite: NEGATIVE
Protein, ur: NEGATIVE mg/dL
Specific Gravity, Urine: 1.026 (ref 1.005–1.030)
pH: 7 (ref 5.0–8.0)

## 2022-05-15 LAB — CBG MONITORING, ED: Glucose-Capillary: 140 mg/dL — ABNORMAL HIGH (ref 70–99)

## 2022-05-15 LAB — CBC
HCT: 41 % (ref 36.0–46.0)
Hemoglobin: 13.2 g/dL (ref 12.0–15.0)
MCH: 27.8 pg (ref 26.0–34.0)
MCHC: 32.2 g/dL (ref 30.0–36.0)
MCV: 86.3 fL (ref 80.0–100.0)
Platelets: 242 10*3/uL (ref 150–400)
RBC: 4.75 MIL/uL (ref 3.87–5.11)
RDW: 13.8 % (ref 11.5–15.5)
WBC: 12 10*3/uL — ABNORMAL HIGH (ref 4.0–10.5)
nRBC: 0 % (ref 0.0–0.2)

## 2022-05-15 LAB — I-STAT CHEM 8, ED
BUN: 10 mg/dL (ref 8–23)
Calcium, Ion: 1.13 mmol/L — ABNORMAL LOW (ref 1.15–1.40)
Chloride: 104 mmol/L (ref 98–111)
Creatinine, Ser: 0.6 mg/dL (ref 0.44–1.00)
Glucose, Bld: 135 mg/dL — ABNORMAL HIGH (ref 70–99)
HCT: 37 % (ref 36.0–46.0)
Hemoglobin: 12.6 g/dL (ref 12.0–15.0)
Potassium: 3.7 mmol/L (ref 3.5–5.1)
Sodium: 140 mmol/L (ref 135–145)
TCO2: 22 mmol/L (ref 22–32)

## 2022-05-15 LAB — COMPREHENSIVE METABOLIC PANEL
ALT: 16 U/L (ref 0–44)
AST: 23 U/L (ref 15–41)
Albumin: 3.3 g/dL — ABNORMAL LOW (ref 3.5–5.0)
Alkaline Phosphatase: 47 U/L (ref 38–126)
Anion gap: 8 (ref 5–15)
BUN: 10 mg/dL (ref 8–23)
CO2: 22 mmol/L (ref 22–32)
Calcium: 8.8 mg/dL — ABNORMAL LOW (ref 8.9–10.3)
Chloride: 109 mmol/L (ref 98–111)
Creatinine, Ser: 0.79 mg/dL (ref 0.44–1.00)
GFR, Estimated: >60 mL/min (ref >60)
Glucose, Bld: 132 mg/dL — ABNORMAL HIGH (ref 70–99)
Potassium: 3.9 mmol/L (ref 3.5–5.1)
Sodium: 139 mmol/L (ref 135–145)
Total Bilirubin: 0.9 mg/dL (ref 0.3–1.2)
Total Protein: 6.1 g/dL — ABNORMAL LOW (ref 6.5–8.1)

## 2022-05-15 LAB — RAPID URINE DRUG SCREEN, HOSP PERFORMED
Amphetamines: NOT DETECTED
Barbiturates: NOT DETECTED
Benzodiazepines: NOT DETECTED
Cocaine: NOT DETECTED
Opiates: NOT DETECTED
Tetrahydrocannabinol: NOT DETECTED

## 2022-05-15 LAB — PROTIME-INR
INR: 1 (ref 0.8–1.2)
Prothrombin Time: 13.4 seconds (ref 11.4–15.2)

## 2022-05-15 LAB — HEMOGLOBIN A1C
Hgb A1c MFr Bld: 6.8 % — ABNORMAL HIGH (ref 4.8–5.6)
Mean Plasma Glucose: 148.46 mg/dL

## 2022-05-15 LAB — ETHANOL: Alcohol, Ethyl (B): <10 mg/dL (ref <10)

## 2022-05-15 LAB — MRSA NEXT GEN BY PCR, NASAL: MRSA by PCR Next Gen: NOT DETECTED

## 2022-05-15 LAB — APTT: aPTT: 28 seconds (ref 24–36)

## 2022-05-15 MED ORDER — ACETAMINOPHEN 160 MG/5ML PO SOLN: 650.0000 mg | ORAL | Status: DC | PRN

## 2022-05-15 MED ORDER — ORAL CARE MOUTH RINSE: 15.0000 mL | OROMUCOSAL | Status: DC | PRN

## 2022-05-15 MED ORDER — SENNOSIDES-DOCUSATE SODIUM 8.6-50 MG PO TABS
1.0000 | ORAL_TABLET | Freq: Every evening | ORAL | Status: DC | PRN
  Filled 2022-05-15: qty 1

## 2022-05-15 MED ORDER — ACETAMINOPHEN 325 MG PO TABS: 650.0000 mg | ORAL_TABLET | ORAL | Status: DC | PRN

## 2022-05-15 MED ORDER — TENECTEPLASE FOR STROKE
0.2500 mg/kg | PACK | Freq: Once | INTRAVENOUS | Status: AC
  Administered 2022-05-15: 17 mg via INTRAVENOUS
  Filled 2022-05-15: qty 10

## 2022-05-15 MED ORDER — LABETALOL HCL 5 MG/ML IV SOLN
10.0000 mg | INTRAVENOUS | Status: DC | PRN
  Filled 2022-05-15: qty 4

## 2022-05-15 MED ORDER — SODIUM CHLORIDE 0.9 % IV SOLN: INTRAVENOUS | Status: DC

## 2022-05-15 MED ORDER — STROKE: EARLY STAGES OF RECOVERY BOOK
Freq: Once | Status: AC
  Filled 2022-05-15: qty 1

## 2022-05-15 MED ORDER — ACETAMINOPHEN 650 MG RE SUPP: 650.0000 mg | RECTAL | Status: DC | PRN

## 2022-05-15 MED ORDER — CLEVIDIPINE BUTYRATE 0.5 MG/ML IV EMUL: 0.0000 mg/h | INTRAVENOUS | Status: DC

## 2022-05-15 MED ORDER — IOHEXOL 350 MG/ML SOLN
75.0000 mL | Freq: Once | INTRAVENOUS | Status: AC | PRN
  Administered 2022-05-15: 75 mL via INTRAVENOUS

## 2022-05-15 MED ORDER — HYDRALAZINE HCL 20 MG/ML IJ SOLN
10.0000 mg | INTRAMUSCULAR | Status: DC | PRN
  Administered 2022-05-16 – 2022-05-17 (×3): 20 mg via INTRAVENOUS
  Administered 2022-05-17: 10 mg via INTRAVENOUS
  Administered 2022-05-17 – 2022-05-18 (×4): 20 mg via INTRAVENOUS
  Filled 2022-05-15 (×10): qty 1

## 2022-05-15 MED ORDER — LABETALOL HCL 5 MG/ML IV SOLN
10.0000 mg | INTRAVENOUS | Status: DC | PRN
  Administered 2022-05-15 (×2): 10 mg via INTRAVENOUS
  Administered 2022-05-16 – 2022-05-19 (×5): 20 mg via INTRAVENOUS
  Administered 2022-05-19: 10 mg via INTRAVENOUS
  Administered 2022-05-20: 20 mg via INTRAVENOUS
  Filled 2022-05-15 (×7): qty 4

## 2022-05-15 MED ORDER — PANTOPRAZOLE SODIUM 40 MG IV SOLR
40.0000 mg | Freq: Every day | INTRAVENOUS | Status: DC
  Administered 2022-05-15: 40 mg via INTRAVENOUS
  Filled 2022-05-15: qty 10

## 2022-05-15 NOTE — ED Notes (Signed)
PT transported to 4N with this RN. Pt on full cardiac monitor

## 2022-05-15 NOTE — Evaluation (Signed)
Speech Language Pathology Evaluation Patient Details Name: Margaret Butler MRN: [id number] DOB: [birth year] Today's Date: 05/15/2022 Time: 1330-1400 SLP Time Calculation (min) (ACUTE ONLY): 30 min  Problem List:  Patient Active Problem List   Diagnosis Date Noted   Acute ischemic stroke (HCC) 05/15/2022   Hypokalemia 09/15/2019   Acute metabolic encephalopathy 09/14/2019   Hyponatremia 09/14/2019   Carotid artery stenosis 07/20/2019   TIA (transient ischemic attack) 02/16/2019   Left carotid stenosis 02/16/2019   Benign essential HTN 02/16/2019   DM type 2 (diabetes mellitus, type 2) (HCC) 02/16/2019   Expressive aphasia 02/12/2019   Hypertension    Hypertensive urgency    Elevated transaminase level    Hyperlipidemia    Past Medical History:  Past Medical History:  Diagnosis Date   Anemia    Low iron in the past   Arthritis    Constipation    Coronary artery disease    stent in 2019   Depression    "in the past"   DM type 2 (diabetes mellitus, type 2) (HCC) 02/16/2019   GERD (gastroesophageal reflux disease)    "years ago"   Hyperlipidemia    Hypertension    Left carotid stenosis 02/16/2019   Stroke (HCC)    TIA (transient ischemic attack) 02/16/2019   Past Surgical History:  Past Surgical History:  Procedure Laterality Date   ABDOMINAL HYSTERECTOMY     CORONARY ANGIOPLASTY     ENDARTERECTOMY Left 02/15/2019   Procedure: ENDARTERECTOMY CAROTID;  Surgeon: Maeola Harman, MD;  Location: [facility] OR;  Service: Vascular;  Laterality: Left;   JOINT REPLACEMENT Right    knee   PATCH ANGIOPLASTY Left 02/15/2019   Procedure: Patch Angioplasty Using Hemoshield patch size 0.8cm x 7.6cm;  Surgeon: Maeola Harman, MD;  Location: [facility] OR;  Service: Vascular;  Laterality: Left;   TRANSCAROTID ARTERY REVASCULARIZATION  Left 07/20/2019   Procedure: LEFT TRANSCAROTID ARTERY REVASCULARIZATION  with 58mm x 87mm and 47mm x 30 mm stent placement;  Surgeon: Maeola Harman, MD;  Location: [facility] OR;  Service: Vascular;  Laterality: Left;   HPI:  81yo female admitted 05/15/22 with slurred speech, right gaze preference, left weakness, facial droop. PMH: HLD, HTN, CAD, bipolar disorder, PAD, bilateral carotid stenosis s/p stenting of left carotid artery, anemia, arthritis, constipation, DM2, GERD, CVA/TIA   Assessment / Plan / Recommendation Clinical Impression  Pt seen at bedside for cognitive linguistic assessment. Pt was awake and alert, resting in bed with husband at bedside. Pt presents with fully intelligible speech. CN exam is unremarkable. Receptive and Expressive Language are intact. The Mini Mental State Examination was administered this date. Pt scored 29/30, losing one point for stating the year as 2024. Clock drawing task was also [facility]. Pt's performance today reveals cognition to be [facility]. Pt/husband indicate pt has returned to baseline. No further ST intervention recommended at this time. Please reconsult if needs arise.    SLP Assessment  SLP Recommendation/Assessment: Patient does not need any further Speech Language Pathology Services  SLP Visit Diagnosis: Cognitive communication deficit (R41.841)    Recommendations for follow up therapy are one component of a multi-disciplinary discharge planning process, led by the attending physician.  Recommendations may be updated based on patient status, additional functional criteria and insurance authorization.    Follow Up Recommendations  No SLP follow up    Assistance Recommended at Discharge  None  Functional Status Assessment Patient has not had a recent decline in their functional status  SLP Evaluation Cognition  Overall Cognitive Status: Within Functional Limits for tasks assessed Arousal/Alertness: Awake/alert Orientation Level: Oriented X4 Year: 2024 Month: October Day of Week: Correct       Comprehension  Auditory Comprehension Overall Auditory Comprehension: Appears within  functional limits for tasks assessed Reading Comprehension Reading Status: Within funtional limits    Expression Expression Primary Mode of Expression: Verbal Verbal Expression Overall Verbal Expression: Appears within functional limits for tasks assessed Written Expression Dominant Hand: Right Written Expression: Within Functional Limits   Oral / Motor  Oral Motor/Sensory Function Overall Oral Motor/Sensory Function: Within functional limits Motor Speech Overall Motor Speech: Appears within functional limits for tasks assessed Articulation: Within functional limitis           Margaret Butler B. Quentin Ore, [facility], [place] Speech Language Pathologist Office: [phone]  Shonna Chock 05/15/2022, 2:06 PM

## 2022-05-15 NOTE — ED Provider Notes (Signed)
[facility] EMERGENCY DEPARTMENT Provider Note   CSN: [id number] Arrival date & time: 05/15/22  1224  An emergency department physician performed an initial assessment on this suspected stroke patient at 1224.  History  Chief Complaint  Patient presents with   Code Stroke    Margaret Butler is a 81 y.o. female.  Margaret Butler is a 81 y.o. female with hx of HTN, HLD, TIA, DM, CAD, who presents via EMS as CODE Stroke. Pt presents with sudden onset left sided weakness, left facial droop, slurred speech and left sided sensory deficit with let sided neglect. Last known well 11:30 AM. Pt had just finished water aerobics  and was waiting on bench when she experienced sudden onset of above symptoms. EMS called and noted pt to have flaccid left sided paralysis, also noted to be hypotensive, with systolic BP in 17C, given 250 cc bolus and symptoms improved in route. Only sensory and visual field deficit remained upon arrival. No witnessed seizure like activity.   The history is provided by the patient, the EMS personnel and medical records.       Home Medications Prior to Admission medications   Medication Sig Start Date End Date Taking? Authorizing Provider  aspirin EC 81 MG tablet Take 81 mg by mouth daily.    [provider]  blood glucose meter kit and supplies KIT Dispense based on patient and insurance preference. Use up to four times daily as directed. (FOR ICD-9 250.00, 250.01). 02/16/19   Samuella Cota, MD  Cholecalciferol (VITAMIN D) 50 MCG (2000 UT) tablet Take 2,000 Units by mouth daily.    [provider]  clopidogrel (PLAVIX) 75 MG tablet Take 1 tablet (75 mg total) by mouth daily at 8 pm. Patient taking differently: Take 75 mg by mouth daily.  07/15/19   [name], MD  co-enzyme Q-10 50 MG capsule Take 1 capsule (50 mg total) by mouth daily. 09/16/19   Georgette Shell, MD  escitalopram (LEXAPRO) 10 MG tablet Take 10 mg  by mouth daily. 08/26/19   [provider]  hydrALAZINE (APRESOLINE) 50 MG tablet Take 50 mg by mouth 2 (two) times daily. 09/09/19   [provider]  Magnesium 250 MG TABS Take 500 mg by mouth at bedtime as needed (constipation).     [provider]  metFORMIN (GLUCOPHAGE) 500 MG tablet Take 500 mg by mouth 2 (two) times daily.    [provider]  metoprolol succinate (TOPROL-XL) 50 MG 24 hr tablet Take 50 mg by mouth daily. 09/13/19   [provider]  oxymetazoline (AFRIN) 0.05 % nasal spray Place 1 spray into both nostrils 2 (two) times daily as needed for congestion.    [provider]  TURMERIC PO Take 1,500 mg by mouth daily.     [provider]      Allergies    5-alpha reductase inhibitors, Atorvastatin, Lisinopril, and Sertraline    Review of Systems   Review of Systems  Unable to perform ROS: Acuity of condition    Physical Exam Updated Vital Signs BP (!) 174/58   Pulse 77   Temp 98.1 F (36.7 C)   Resp 20   Ht 5' 3.5" (1.613 m)   Wt 67 kg   SpO2 96%   BMI 25.75 kg/m  Physical Exam Vitals and nursing note reviewed.  Constitutional:      General: She is not in acute distress.    Appearance: Normal appearance. She is well-developed.  She is ill-appearing. She is not diaphoretic.  HENT:     Head: Normocephalic and atraumatic.     Mouth/Throat:     Mouth: Mucous membranes are moist.     Pharynx: Oropharynx is clear.  Eyes:     General:        Right eye: No discharge.        Left eye: No discharge.     Extraocular Movements: Extraocular movements intact.     Pupils: Pupils are equal, round, and reactive to light.     Comments: Left upper quadrantopia  Cardiovascular:     Rate and Rhythm: Normal rate and regular rhythm.     Pulses: Normal pulses.     Heart sounds: Normal heart sounds.  Pulmonary:     Effort: Pulmonary effort is normal. No respiratory distress.     Breath sounds: Normal breath sounds. No  wheezing or rales.     Comments: Respirations equal and unlabored, patient able to speak in full sentences, lungs clear to auscultation bilaterally  Abdominal:     General: Bowel sounds are normal. There is no distension.     Palpations: Abdomen is soft. There is no mass.     Tenderness: There is no abdominal tenderness. There is no guarding.     Comments: Abdomen soft, nondistended, nontender to palpation in all quadrants without guarding or peritoneal signs  Musculoskeletal:        General: No deformity.     Cervical back: Neck supple.  Skin:    General: Skin is warm and dry.     Capillary Refill: Capillary refill takes less than 2 seconds.  Neurological:     Mental Status: She is alert.     Comments: Cranial Nerves: PERRL EOMI, persistent left upper quadrantopia, no facial asymmetry, facial sensation intact, hearing intact, tongue/uvula/soft palate midline, normal sternocleidomastoid and trapezius muscle strength. No evidence of tongue atrophy or fibrillations Motor: No drift in any of the 4 extremities Tone: is normal and bulk is normal Sensation- Intact to light touch bilaterally, sensation reported as decreased on left Coordination: FTN intact bilaterally, no ataxia in BLE.   Psychiatric:        Mood and Affect: Mood normal.        Behavior: Behavior normal.     ED Results / Procedures / Treatments   Labs (all labs ordered are listed, but only abnormal results are displayed) Labs Reviewed  CBC - Abnormal; Notable for the following components:      Result Value   WBC 12.0 (*)    All other components within normal limits  DIFFERENTIAL - Abnormal; Notable for the following components:   Neutro Abs 9.0 (*)    All other components within normal limits  COMPREHENSIVE METABOLIC PANEL - Abnormal; Notable for the following components:   Glucose, Bld 132 (*)    Calcium 8.8 (*)    Total Protein 6.1 (*)    Albumin 3.3 (*)    All other components within normal limits  I-STAT CHEM  8, ED - Abnormal; Notable for the following components:   Glucose, Bld 135 (*)    Calcium, Ion 1.13 (*)    All other components within normal limits  CBG MONITORING, ED - Abnormal; Notable for the following components:   Glucose-Capillary 140 (*)    All other components within normal limits  ETHANOL  PROTIME-INR  APTT  RAPID URINE DRUG SCREEN, HOSP PERFORMED  URINALYSIS, ROUTINE W REFLEX MICROSCOPIC  HEMOGLOBIN A1C  EKG EKG Interpretation  Date/Time:  Thursday May 15 2022 12:54:17 EDT Ventricular Rate:  75 PR Interval:  152 QRS Duration: 102 QT Interval:  400 QTC Calculation: 447 R Axis:   64 Text Interpretation: Sinus rhythm Probable left atrial enlargement Abnormal R-wave progression, early transition Borderline repolarization abnormality inferior ST depression ,TWI - new Confirmed by Varney Biles [phone]) on 05/15/2022 1:42:21 PM  Radiology CT ANGIO HEAD NECK W WO CM (CODE STROKE)  Result Date: 05/15/2022 CLINICAL DATA:  Stroke EXAM: CT ANGIOGRAPHY HEAD AND NECK TECHNIQUE: Multidetector CT imaging of the head and neck was performed using the standard protocol during bolus administration of intravenous contrast. Multiplanar CT image reconstructions and MIPs were obtained to evaluate the vascular anatomy. Carotid stenosis measurements (when applicable) are obtained utilizing NASCET criteria, using the distal internal carotid diameter as the denominator. RADIATION DOSE REDUCTION: This exam was performed according to the departmental dose-optimization program which includes automated exposure control, adjustment of the mA and/or kV according to patient size and/or use of iterative reconstruction technique. CONTRAST:  77m OMNIPAQUE IOHEXOL 350 MG/ML SOLN COMPARISON:  Same-day noncontrast head CT, CTA head and neck 09/14/2019 FINDINGS: CTA NECK FINDINGS Image quality is degraded by motion artifact particularly at the level of the carotid bifurcation on the right. Aortic arch:  There is minimal calcified plaque in the aortic arch. The origins of the major branch vessels are patent. The subclavian arteries are patent to the level imaged. Right carotid system: The right common carotid artery is patent. There is bulky mixed plaque at the bifurcation resulting in up to approximately 60% stenosis, though this is suboptimally evaluated due to motion artifact. The distal right internal carotid artery is patent. The external carotid artery is patent. There is no evidence of dissection or aneurysm. Left carotid system: The left common carotid artery is patent with a patent stent graft. The internal carotid artery is widely patent the external carotid artery appears occluded at its origin with reconstitution of flow in the distal branches unchanged. There is no evidence of dissection or aneurysm. Vertebral arteries: Vertebral arteries are patent, without hemodynamically significant stenosis or occlusion. There is scattered calcified plaque bilaterally, most notably in the left V3 segment, without greater than mild stenosis. There is no dissection or aneurysm. Skeleton: There is no acute osseous abnormality. There is no visible canal hematoma. There is fluid collection anterior to the left glenohumeral joint which is incompletely imaged. A smaller fluid collection with associated calcified loose bodies are seen on the right. Findings consistent with fluid distention of the subscapularis recesses bilaterally. Other neck: The soft tissues of the neck are unremarkable. Upper chest: The imaged lung apices are clear. Review of the MIP images confirms the above findings CTA HEAD FINDINGS Anterior circulation: Is calcified plaque in the intracranial ICAs resulting in up to moderate stenosis of the bilateral paraclinoid segments. There is unchanged focal severe stenosis of the right M1 segment (11-102, 12-105). The distal right MCA branches are patent. The left MCA is patent without proximal stenosis or  occlusion. The bilateral ACAs are patent with focal mild stenosis of the right A2 segment. There is no other significant stenosis or occlusion. The anterior communicating artery is normal. There is no aneurysm or AVM. Posterior circulation: There is multifocal calcified plaque in the bilateral V4 segments resulting in multifocal moderate to severe stenosis, unchanged. The basilar artery is patent with unchanged multifocal irregularity and up to severe stenosis in the midportion (11-117). The major cerebellar arteries appear patent. There  is unchanged focal moderate to severe stenosis of the left P1/P2 junction (11-103, 12-115). There is unchanged focal severe stenosis of the right P1 segment (11-101). Left larger than right posterior communicating arteries are identified. Venous sinuses: Patent. Anatomic variants: As above. Review of the MIP images confirms the above findings IMPRESSION: 1. No emergent large vessel occlusion. 2. Mixed plaque at the right carotid bifurcation resulting in approximately 60% stenosis, degraded by motion artifact. 3. Patent left common/internal carotid artery stent, and patent vertebral arteries in the neck. 4. Overall unchanged intracranial atherosclerotic disease resulting in multifocal moderate to severe stenoses in the anterior and posterior circulations as described in detail above. Notably, there is focal severe stenosis in the mid basilar artery which is unchanged. 5. 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 Electronically Signed   By: Valetta Mole M.D.   On: 05/15/2022 13:11   CT HEAD CODE STROKE WO CONTRAST  Addendum Date: 05/15/2022   ADDENDUM REPORT: 05/15/2022 12:56 ADDENDUM: Upon further review, there is a 12 mm focus of abnormal hypodensity within the right internal capsule/inferior right basal ganglia (series 2, image 15) (series 5, image 34). This is new from the prior head CT of 09/14/2019 and may  reflect an acute infarct. Consider a brain MRI for further evaluation. These results were called by telephone at the time of interpretation on 05/15/2022 at 12:50 pm to provider Dr. Rory Percy, who verbally acknowledged these results. Electronically Signed   By: Kellie Simmering D.O.   On: 05/15/2022 12:56   Result Date: 05/15/2022 CLINICAL DATA:  Code stroke. Neuro deficit, acute, stroke suspected. EXAM: CT HEAD WITHOUT CONTRAST TECHNIQUE: Contiguous axial images were obtained from the base of the skull through the vertex without intravenous contrast. RADIATION DOSE REDUCTION: This exam was performed according to the departmental dose-optimization program which includes automated exposure control, adjustment of the mA and/or kV according to patient size and/or use of iterative reconstruction technique. COMPARISON:  Brain MRI 09/14/2019. CT angiogram head/neck 09/14/2019. Non-contrast head CT 09/14/2019. FINDINGS: Brain: Mild generalized cerebral atrophy. Redemonstrated chronic lacunar infarct within the right caudate head. Moderate multifocal T2 FLAIR hyperintense signal abnormality within the cerebral white matter, nonspecific but compatible with chronic small-vessel image disease. There is no acute intracranial hemorrhage. No demarcated cortical infarct. No extra-axial fluid collection. No evidence of an intracranial mass. No midline shift. Vascular: No hyperdense vessel. Atherosclerotic calcifications. Skull: No fracture or aggressive osseous lesion. Sinuses/Orbits: No mass or acute finding within the imaged orbits. No significant paranasal sinus disease at the imaged levels. ASPECTS ([place] Stroke Program Early CT Score) - Ganglionic level infarction (caudate, lentiform nuclei, internal capsule, insula, M1-M3 cortex): 7 - Supraganglionic infarction (M4-M6 cortex): 3 Total score (0-10 with 10 being normal): 10 Attempts are being made to reach the ordering provider at this time. IMPRESSION: 1. No evidence of acute  intracranial abnormality. 2. Redemonstrated chronic lacunar infarct within the right caudate head. 3. Moderate chronic small vessel ischemic changes within the cerebral white matter. 4. Mild generalized cerebral atrophy. Electronically Signed: By: Kellie Simmering D.O. On: 05/15/2022 12:45    Procedures .Critical Care  Performed by: Jacqlyn Larsen, PA-C Authorized by: Jacqlyn Larsen, PA-C   Critical care provider statement:    Critical care time (minutes):  30   Critical care was necessary to treat or prevent imminent or life-threatening deterioration of the following conditions:  CNS failure or compromise   Critical care was time spent personally by me  on the following activities:  Development of treatment plan with patient or surrogate, discussions with consultants, evaluation of patient's response to treatment, examination of patient, ordering and review of laboratory studies, ordering and review of radiographic studies, ordering and performing treatments and interventions, pulse oximetry, re-evaluation of patient's condition and review of old charts     Medications Ordered in ED Medications   stroke: early stages of recovery book (has no administration in time range)  0.9 %  sodium chloride infusion ( Intravenous New Bag/Given 05/15/22 1329)  acetaminophen (TYLENOL) tablet 650 mg (has no administration in time range)    Or  acetaminophen (TYLENOL) 160 MG/5ML solution 650 mg (has no administration in time range)    Or  acetaminophen (TYLENOL) suppository 650 mg (has no administration in time range)  senna-docusate (Senokot-S) tablet 1 tablet (has no administration in time range)  pantoprazole (PROTONIX) injection 40 mg (has no administration in time range)  tenecteplase (TNKASE) injection for Stroke 17 mg (17 mg Intravenous Given 05/15/22 1237)  iohexol (OMNIPAQUE) 350 MG/ML injection 75 mL (75 mLs Intravenous Contrast Given 05/15/22 1248)    ED Course/ Medical Decision Making/ A&P                            Medical Decision Making Amount and/or Complexity of Data Reviewed Labs: ordered. Radiology: ordered.  Risk Decision regarding hospitalization.   81 y.o. female presents to the ED via EMS as a CODE STROKE, this involves an extensive number of treatment options, and is a complaint that carries with it a high risk of complications and morbidity.  The differential diagnosis includes Ischemic stroke, hemorrhagic stroke, seizure, hypotension, dissection   On arrival pt is nontoxic, vitals significant for mild hypertension with systolic blood pressure in the 150s-160s. Exam significant for persistent visual field deficit and sensory deficit but other neurologic deficits have resolved.  NIH score of 2 on arrival  Additional history obtained from EMS personnel. Previous records obtained and reviewed   Patient assessed with neurology team at bedside on arrival, after initial assessment and CT decision made to initiate TNK  Lab Tests:  I Ordered, reviewed, and interpreted labs, which included: Mild leukocytosis of 12.0, glucose 132, no other significant electrolyte derangements, normal renal and liver function, normal coags  Imaging Studies ordered:  I ordered imaging studies which included CT head without contrast and CT angio head and neck with perfusion study, I independently visualized and interpreted imaging alongside neurology which showed no acute intracranial bleeding, redemonstrated chronic lacunar infarct within the right caudate head, CTA with no evidence of large vessel occlusion, mixed plaque at the right carotid bifurcation resulting in approximately 60% stenosis  ED Course:   81 year old female with initial left-sided weakness, sensory deficit, left-sided neglect and left hemianopsia, slurred speech and left-sided facial droop, hypotensive in route to the hospital, given normal saline bolus with some improvement in deficits, persisting quadrant Tobia and left-sided  sensory deficit remaining.  Patient evaluated upon arrival with Dr. Rory Percy with neurology team, initial CT head without bleeding, patient deemed to be a candidate for TNK therapy and this was initiated at 1237.  Patient's deficits continue to improve after TNK with only left upper quadrantopia remaining.  After receiving TNK patient will be admitted to neuro ICU for post TNK monitoring  Portions of this note were generated with Dragon dictation software. Dictation errors may occur despite best attempts at proofreading.  Final Clinical Impression(s) / ED Diagnoses Final diagnoses:  Acute ischemic stroke [facility])    Rx / DC Orders ED Discharge Orders     None         Jacqlyn Larsen, [place] 06/20/22 1413    Varney Biles, MD 06/22/22 1404

## 2022-05-15 NOTE — Code Documentation (Signed)
Stroke Response Nurse Documentation Code Documentation  Khamiyah Grefe is a 81 y.o. female arriving to [facility]  via [facility] on 05/15/22 with past medical hx of HLD, HTN, CAD, bipolar disorder, PAD and bilateral carotid stenosis s/p stenting of left carotid artery. On aspirin 81 mg daily and clopidogrel 75 mg daily. Code stroke was activated by EMS.   Patient from [facility] where she was LKW at 1130 and now complaining of slurred speech, right gaze, left sided weakness, facial droop. [facility] and had noticed her left sided weakness. She says she had dizziness this am but thought it was due to the recent medication changes related to her mood disorder drug. She called her daughter who told her she needed to get checked out. EMS called and activated the code stroke once pt assessed. Her BP was 88/50 with them. After a 257ml bolus BP went up to sys 140's. CBG WNL.     Stroke team at the bedside on patient arrival. Labs drawn and patient cleared for CT by Dr. Kathrynn Humble. Patient to CT with team. NIHSS 3, see documentation for details and code stroke times. Patient with left hemianopia, left leg weakness, and left decreased sensation on exam. The following imaging was completed:  CT Head and CTA. Patient is a candidate for IV Thrombolytic due to within window and criteria. Patient is not a candidate for IR due to no LVO per MD. TNK given at 1237. BP 158/66 before admin.   Care Plan: Q15x2hr, Q30x6hr, Q1x16 hr, MRI, BP<180/105, admit to ICU for close monitoring and 24hr scan.   Bedside handoff with ED RN Bland Span.    Margarette Asal  Stroke Response RN [phone]

## 2022-05-15 NOTE — H&P (Signed)
Stroke Neurology Admission History & Physical   CC: acute onset left sided weakness, left sided sensory deficit and left visual field cut  History is obtained from:patient, EMS and husband  HPI: Margaret Butler is a 81 y.o. female with history of HLD, HTN, CAD, TIA, stroke, bipolar disorder, PAD and bilateral carotid stenosis s/p stenting of left carotid artery who presents with sudden onset left sided weakness, left facial droop, slurred speech, left sided sensory deficit, left sided neglect and left upper quandrantopia.  Patient had just finished a water aerobics class and was waiting on a bench when she experienced sudden onset of the above symptoms.  EMS was called, they found her to have flaccid left hemiparesis and she was brought to the ED as a code stroke.  Symptoms improved on the way to the hospital but not completely resolved. Patient was initially hypotensive in the ambulance with systolic BP in the 96E- Systolic 88.  She was given a 250cc bolus of NS and then became normotensive.  While en route to the hospital, patient's symptoms began to improve with only left sided sensory deficit and visual field cut noted on arrival.  LKW: 1130 IV thrombolysis given?: yes, at 1237 Premorbid modified Rankin scale (mRS): 0 0-Completely asymptomatic and back to baseline post-stroke  ROS: Full ROS was performed and is negative except as noted in the HPI.   Past Medical History:  Diagnosis Date   Anemia    Low iron in the past   Arthritis    Constipation    Coronary artery disease    stent in 2019   Depression    "in the past"   DM type 2 (diabetes mellitus, type 2) ([place]) 02/16/2019   GERD (gastroesophageal reflux disease)    "years ago"   Hyperlipidemia    Hypertension    Left carotid stenosis 02/16/2019   Stroke [facility])    TIA (transient ischemic attack) 02/16/2019   Family History  Problem Relation Age of Onset   Heart attack Father    Social History:   reports that she has never  smoked. She has never used smokeless tobacco. She reports that she does not drink alcohol and does not use drugs.  Medications  Current Facility-Administered Medications:    tenecteplase (TNKASE) injection for Stroke 17 mg, 0.25 mg/kg, Intravenous, Once, Nanavati, Ankit, MD  Current Outpatient Medications:    aspirin EC 81 MG tablet, Take 81 mg by mouth daily., Disp: , Rfl:    blood glucose meter kit and supplies KIT, Dispense based on patient and insurance preference. Use up to four times daily as directed. (FOR ICD-9 250.00, 250.01)., Disp: 1 each, Rfl: 0   Cholecalciferol (VITAMIN D) 50 MCG (2000 UT) tablet, Take 2,000 Units by mouth daily., Disp: , Rfl:    clopidogrel (PLAVIX) 75 MG tablet, Take 1 tablet (75 mg total) by mouth daily at 8 pm. (Patient taking differently: Take 75 mg by mouth daily. ), Disp: 90 tablet, Rfl: 2   co-enzyme Q-10 50 MG capsule, Take 1 capsule (50 mg total) by mouth daily., Disp: 30 capsule, Rfl: 3   escitalopram (LEXAPRO) 10 MG tablet, Take 10 mg by mouth daily., Disp: , Rfl:    hydrALAZINE (APRESOLINE) 50 MG tablet, Take 50 mg by mouth 2 (two) times daily., Disp: , Rfl:    Magnesium 250 MG TABS, Take 500 mg by mouth at bedtime as needed (constipation). , Disp: , Rfl:    metFORMIN (GLUCOPHAGE) 500 MG tablet, Take 500 mg by  mouth 2 (two) times daily., Disp: , Rfl:    metoprolol succinate (TOPROL-XL) 50 MG 24 hr tablet, Take 50 mg by mouth daily., Disp: , Rfl:    oxymetazoline (AFRIN) 0.05 % nasal spray, Place 1 spray into both nostrils 2 (two) times daily as needed for congestion., Disp: , Rfl:    TURMERIC PO, Take 1,500 mg by mouth daily. , Disp: , Rfl:   Exam: Current vital signs: BP (!) 162/56   Pulse 73   Temp 98.1 F (36.7 C)   Resp 13   Ht 5' 3.5" (1.613 m)   Wt 67 kg   SpO2 98%   BMI 25.75 kg/m  Vital signs in last 24 hours: Temp:  [98.1 F (36.7 C)] 98.1 F (36.7 C) (10/05 1326) Pulse Rate:  [71-77] 73 (10/05 1345) Resp:  [13-20] 13 (10/05  1345) BP: (158-174)/(54-75) 162/56 (10/05 1345) SpO2:  [97 %-100 %] 98 % (10/05 1345) Weight:  [67 kg] 67 kg (10/05 1200) GENERAL: Awake, alert in NAD HEENT: - Normocephalic and atraumatic, moist mm with some mucosal bleeding LUNGS - respirations unlabored on room air CV - NSR on monitor Ext: warm, well perfused, intact peripheral pulses, no edema NEURO:  Mental Status: AA&Ox3  Language: speech is clear and fluent.  Naming, repetition, fluency, and comprehension intact. Cranial Nerves: PERRL EOMI, persistent left upper quadrantopia, no facial asymmetry, facial sensation intact, hearing intact, tongue/uvula/soft palate midline, normal sternocleidomastoid and trapezius muscle strength. No evidence of tongue atrophy or fibrillations Motor: No drift in any of the 4 extremities-this is different from the EMS exam where the left upper and lower extremity was flaccid. Tone: is normal and bulk is normal Sensation- Intact to light touch bilaterally  Coordination: FTN intact bilaterally, no ataxia in BLE. Gait- deferred  NIHSS 1a Level of Conscious.: 0 1b LOC Questions: 0 1c LOC Commands: 0 2 Best Gaze: 0 3 Visual: 1 4 Facial Palsy: 0 5a Motor Arm - left: 0 5b Motor Arm - Right: 0 6a Motor Leg - Left: 0 6b Motor Leg - Right: 0 7 Limb Ataxia: 0 8 Sensory: 1 9 Best Language: 0 10 Dysarthria: 0 11 Extinct. and Inatten.: 0 TOTAL: 2   Labs I have reviewed labs in epic and the results pertinent to this consultation are:  CBC    Component Value Date/Time   WBC 13.3 (H) 09/14/2019 2155   RBC 4.66 09/14/2019 2155   HGB 13.6 09/14/2019 2158   HCT 40.0 09/14/2019 2158   PLT 280 09/14/2019 2155   MCV 79.6 (L) 09/14/2019 2155   MCH 28.3 09/14/2019 2155   MCHC 35.6 09/14/2019 2155   RDW 13.9 09/14/2019 2155   LYMPHSABS 2.0 09/14/2019 2155   MONOABS 1.2 (H) 09/14/2019 2155   EOSABS 0.1 09/14/2019 2155   BASOSABS 0.1 09/14/2019 2155    CMP     Component Value Date/Time   NA 135  09/16/2019 0936   K 4.0 09/16/2019 0936   CL 101 09/16/2019 0936   CO2 23 09/16/2019 0936   GLUCOSE 204 (H) 09/16/2019 0936   BUN 7 (L) 09/16/2019 0936   CREATININE 0.45 09/16/2019 0936   CALCIUM 9.2 09/16/2019 0936   PROT 7.2 09/14/2019 2155   ALBUMIN 3.9 09/14/2019 2155   AST 81 (H) 09/14/2019 2155   ALT 120 (H) 09/14/2019 2155   ALKPHOS 50 09/14/2019 2155   BILITOT 1.2 09/14/2019 2155   GFRNONAA >60 09/16/2019 0936   GFRAA >60 09/16/2019 0936    Lipid Panel  Component Value Date/Time   CHOL 226 (H) 02/13/2019 1045   TRIG 209 (H) 02/13/2019 1045   HDL 62 02/13/2019 1045   CHOLHDL 3.6 02/13/2019 1045   VLDL 42 (H) 02/13/2019 1045   LDLCALC 122 (H) 02/13/2019 1045     Imaging I have reviewed the images obtained: CT-head small vessel chronic ischemic changes, atrophy, no acute abnormality CTA head and neck No emergent LVO, 60% stenosis at right carotid bifurcation, patent left carotid stent, severe stenosis of basilar artery.  There is also focal stenosis of the right P1 segment.  MRI examination of the brain pending  Assessment:  81 year old patient presents with acute onset left sided weakness, sensory deficit, left sided neglect, left hemianopia, slurred speech and left sided facial droop. Patient was hypotensive while en route to the hospital and was given a NS bolus to treat this.  Symptoms had improved by the time she reached the hospital with only left upper quadrantopia and left sided sensory deficit remaining.  Patient was given TNK at 1237 to treat her stroke, and symptoms continued to improve after this with only left upper quadrantopia remaining.  Will need to admit to ICU for post TNK monitoring.  Will need PT/OT/SLP evaluation as well as brain MRI tomorrow around noon.  Will obtain 2D echocardiogram, lipid panel and A1c.  Symptoms of sensory loss and visual field neglect are likely related to the posterior circulation stenosis specifically the right P1  stenosis-not read in the formal radiology report.  Episode of hypotension might have exacerbated the symptoms.  Cerebral infarction due to embolism of right posterior cerebral artery  Impression: Acute ischemic stroke in patient with multiple risk facortos, s/p Tnk   Plan:   Cerebral infarction due to embolism of right posterior cerebral artery   Acuity: Acute Current Suspected Etiology: unknown at this time, atheroembolic versus cardioembolic Continue Evaluation:  -Admit to: Neuro ICU -Hold Aspirin until 24 hour post IV thrombolysis (tPA or TNKase) neuroimaging is stable and without evidence of bleeding -Blood pressure control, goal of SYS <180 -MRI/ECHO/A1C/Lipid panel. -Hyperglycemia management per SSI to maintain glucose 140-160m/dL. -PT/OT/ST therapies and recommendations when able  CNS Close neuro monitoring  Dysarthria Dysphagia following cerebral infarction  -NPO until cleared by speech -ST -Advance diet as tolerated -Therapy consults  CV Essential (primary) hypertension Was hypotensive en route to the hospital, hold home BP meds for now.  Can use IV labetalol/hydralazine to achieve BP goal  Hyperlipidemia, unspecified  Continue Repatha  ENDO Type 2 diabetes mellitus w/o complications. -SSI -Start oral meds upon discharge -goal HgbA1c < 7  GI/GU No active issues Gentle hydration Check BMP in the morning No electrolyte abnormalities at this time.  We will continue to monitor labs  Infectious disease No active issues Trend fever curve and labs  Prophylaxis DVT:  SCDs GI: IV protonix Bowel: PRN senna  Diet: NPO until cleared by speech  Code Status: Full Code   THE FOLLOWING WERE PRESENT ON ADMISSION: CNS -  Acute Ischemic Stroke Bilateral carotid stenosis s/p left carotid stenting HLD Diabetes mellitus type 2 Bipolar disorder Coronary artery disease Essential hypertension  Risks, benefits, alternatives of IV thrombolysis were discussed  and family/patient agreed to proceed. CT imaging was reviewed personally prior to IV thrombolysis administration with no evidence of bleed. -- AAmie Portland MD Neurologist [facility] Pager: [phone] CRITICAL CARE ATTESTATION Performed by: AAmie Portland MD Total critical care time: 55 minutes Critical care time was exclusive of separately billable procedures and  treating other patients and/or supervising APPs/Residents/Students Critical care was necessary to treat or prevent imminent or life-threatening deterioration due to acute ischemic stroke required IV thrombolysis This patient is critically ill and at significant risk for neurological worsening and/or death and care requires constant monitoring. Critical care was time spent personally by me on the following activities: development of treatment plan with patient and/or surrogate as well as nursing, discussions with consultants, evaluation of patient's response to treatment, examination of patient, obtaining history from patient or surrogate, ordering and performing treatments and interventions, ordering and review of laboratory studies, ordering and review of radiographic studies, pulse oximetry, re-evaluation of patient's condition, participation in multidisciplinary rounds and medical decision making of high complexity in the care of this patient.

## 2022-05-15 NOTE — Progress Notes (Signed)
PHARMACIST CODE STROKE RESPONSE  Notified to mix TNK at 1235 by Dr. Rory Percy TNK preparation completed at 1236  TNK dose = 17 mg IV over 5 seconds.   Issues/delays encountered (if applicable):   Margaret Butler 05/15/22 12:38 PM

## 2022-05-16 ENCOUNTER — Inpatient Hospital Stay (HOSPITAL_COMMUNITY): Payer: Medicare PPO

## 2022-05-16 DIAGNOSIS — I639 Cerebral infarction, unspecified: Secondary | ICD-10-CM | POA: Diagnosis not present

## 2022-05-16 DIAGNOSIS — Z9889 Other specified postprocedural states: Secondary | ICD-10-CM | POA: Diagnosis not present

## 2022-05-16 DIAGNOSIS — I6521 Occlusion and stenosis of right carotid artery: Secondary | ICD-10-CM

## 2022-05-16 LAB — ECHOCARDIOGRAM COMPLETE
AR max vel: 3.2 cm2
AV Peak grad: 5.1 mmHg
Ao pk vel: 1.13 m/s
Area-P 1/2: 3.31 cm2
Height: 63.5 in
S' Lateral: 3.27 cm
Weight: 2363.33 oz

## 2022-05-16 LAB — LIPID PANEL
Cholesterol: 150 mg/dL (ref 0–200)
HDL: 51 mg/dL (ref >40)
LDL Cholesterol: 56 mg/dL (ref 0–99)
Total CHOL/HDL Ratio: 2.9 RATIO
Triglycerides: 216 mg/dL — ABNORMAL HIGH (ref <150)
VLDL: 43 mg/dL — ABNORMAL HIGH (ref 0–40)

## 2022-05-16 MED ORDER — ESCITALOPRAM OXALATE 10 MG PO TABS
10.0000 mg | ORAL_TABLET | Freq: Every day | ORAL | Status: DC
  Administered 2022-05-18 – 2022-05-21 (×3): 10 mg via ORAL
  Filled 2022-05-16 (×4): qty 1

## 2022-05-16 MED ORDER — LOSARTAN POTASSIUM 25 MG PO TABS
25.0000 mg | ORAL_TABLET | Freq: Every day | ORAL | Status: DC
  Administered 2022-05-16: 25 mg via ORAL
  Filled 2022-05-16: qty 1

## 2022-05-16 MED ORDER — LAMOTRIGINE 25 MG PO TABS
25.0000 mg | ORAL_TABLET | Freq: Every day | ORAL | Status: DC
  Administered 2022-05-16 – 2022-05-20 (×5): 25 mg via ORAL
  Filled 2022-05-16 (×6): qty 1

## 2022-05-16 MED ORDER — CLOPIDOGREL BISULFATE 75 MG PO TABS
75.0000 mg | ORAL_TABLET | Freq: Every day | ORAL | Status: DC
  Administered 2022-05-16 – 2022-05-21 (×6): 75 mg via ORAL
  Filled 2022-05-16 (×6): qty 1

## 2022-05-16 MED ORDER — CHLORHEXIDINE GLUCONATE CLOTH 2 % EX PADS
6.0000 | MEDICATED_PAD | Freq: Every day | CUTANEOUS | Status: DC
  Administered 2022-05-16 – 2022-05-17 (×2): 6 via TOPICAL

## 2022-05-16 MED ORDER — ASPIRIN 81 MG PO CHEW
81.0000 mg | CHEWABLE_TABLET | Freq: Every day | ORAL | Status: DC
  Administered 2022-05-16: 81 mg via ORAL
  Filled 2022-05-16: qty 1

## 2022-05-16 MED ORDER — ASPIRIN 325 MG PO TBEC
325.0000 mg | DELAYED_RELEASE_TABLET | Freq: Every day | ORAL | Status: DC
  Administered 2022-05-17 – 2022-05-21 (×5): 325 mg via ORAL
  Filled 2022-05-16 (×5): qty 1

## 2022-05-16 MED ORDER — ENOXAPARIN SODIUM 30 MG/0.3ML IJ SOSY
30.0000 mg | PREFILLED_SYRINGE | INTRAMUSCULAR | Status: DC
  Administered 2022-05-16: 30 mg via SUBCUTANEOUS
  Filled 2022-05-16: qty 0.3

## 2022-05-16 NOTE — Progress Notes (Addendum)
STROKE TEAM PROGRESS NOTE   INTERVAL HISTORY Her family  is at the bedside.  Patient is asleep , awakens to voice. Patient states she could not walk straight yesterday after swimming and [facility]. Husband then [facility] leaning [facility] the right. BP was noted to be in the 80's with EMS and received a fluid bolus with improvement in BP.  Has had extreme dizziness since Sunday and was walking into the walls per daughter in the room.   24 hr MRI brain post TNK with Acute infarct in the right medial temporal lobe and posterior limb of the internal capsule. Will consult vascular for right carotid stenosis evaluation  Will d/c IVF. VSS. Will transfer out of ICU today    Vitals:   05/16/22 0700 05/16/22 0703 05/16/22 0722 05/16/22 0800  BP: (!) 188/61 (!) 188/61 (!) 151/45 (!) 149/47  Pulse: 65  70 73  Resp: 13  (!) 21 13  Temp:    98.8 F (37.1 C)  TempSrc:    Axillary  SpO2: 98%  97% 96%  Weight:      Height:       CBC:  Recent Labs  Lab 05/15/22 1227 05/15/22 1244  WBC 12.0*  --   NEUTROABS 9.0*  --   HGB 13.2 12.6  HCT 41.0 37.0  MCV 86.3  --   PLT 242  --    Basic Metabolic Panel:  Recent Labs  Lab 05/15/22 1227 05/15/22 1244  NA 139 140  K 3.9 3.7  CL 109 104  CO2 22  --   GLUCOSE 132* 135*  BUN 10 10  CREATININE 0.79 0.60  CALCIUM 8.8*  --    Lipid Panel:  Recent Labs  Lab 05/16/22 0326  CHOL 150  TRIG 216*  HDL 51  CHOLHDL 2.9  VLDL 43*  LDLCALC 56   HgbA1c:  Recent Labs  Lab 05/15/22 1227  HGBA1C 6.8*   Urine Drug Screen:  Recent Labs  Lab 05/15/22 1224  LABOPIA NONE DETECTED  COCAINSCRNUR NONE DETECTED  LABBENZ NONE DETECTED  AMPHETMU NONE DETECTED  THCU NONE DETECTED  LABBARB NONE DETECTED    Alcohol Level  Recent Labs  Lab 05/15/22 1246  ETH <10    IMAGING past 24 hours ECHOCARDIOGRAM COMPLETE  Result Date: 05/16/2022    ECHOCARDIOGRAM REPORT   Patient Name:   Margaret Butler Date of Exam:  05/15/2022 Medical Rec #:  [id number]       Height:       63.5 in Accession #:    [id number]      Weight:       147.7 lb Date of Birth:  05/06/1941       BSA:          1.710 m Patient Age:    81 years        BP:           174/58 mmHg Patient Gender: F               HR:           73  bpm. Exam Location:  Inpatient Procedure: 2D Echo, Cardiac Doppler and Color Doppler Indications:    Stroke  History:        Patient has no prior history of Echocardiogram examinations.                 CAD; Risk Factors:Hypertension, Diabetes and Dyslipidemia.  Sonographer:  Eduard Roux Referring Phys: [id number] ASHISH ARORA IMPRESSIONS  1. Left ventricular ejection fraction, by estimation, is 50 to 55%. The left ventricle has low normal function. The left ventricle has no regional wall motion abnormalities. There is moderate concentric left ventricular hypertrophy. Left ventricular diastolic parameters are consistent with Grade I diastolic dysfunction (impaired relaxation).  2. Right ventricular systolic function is normal. The right ventricular size is normal. Tricuspid regurgitation signal is inadequate for assessing PA pressure.  3. Left atrial size was mildly dilated.  4. Right atrial size was mildly dilated.  5. A small pericardial effusion is present.  6. The mitral valve is degenerative. Trivial mitral valve regurgitation. No evidence of mitral stenosis.  7. The aortic valve is grossly normal. There is mild calcification of the aortic valve. There is mild thickening of the aortic valve. Aortic valve regurgitation is trivial. No aortic stenosis is present.  8. The inferior vena cava is normal in size with greater than 50% respiratory variability, suggesting right atrial pressure of 3 mmHg. Conclusion(s)/Recommendation(s): Concentric LVH and suboptimal visualization of LV apex. Consider repeat focused echo with strain and Definity contrast to fully evaluate LVH. Consider evaluation for cardiac amyloidosis if clincially indicated.  FINDINGS  Left Ventricle: Left ventricular ejection fraction, by estimation, is 50 to 55%. The left ventricle has low normal function. The left ventricle has no regional wall motion abnormalities. The left ventricular internal cavity size was normal in size. There is moderate concentric left ventricular hypertrophy. Left ventricular diastolic parameters are consistent with Grade I diastolic dysfunction (impaired relaxation). Right Ventricle: The right ventricular size is normal. No increase in right ventricular wall thickness. Right ventricular systolic function is normal. Tricuspid regurgitation signal is inadequate for assessing PA pressure. Left Atrium: Left atrial size was mildly dilated. Right Atrium: Right atrial size was mildly dilated. Pericardium: A small pericardial effusion is present. Mitral Valve: The mitral valve is degenerative in appearance. Mild to moderate mitral annular calcification. Trivial mitral valve regurgitation. No evidence of mitral valve stenosis. Tricuspid Valve: The tricuspid valve is normal in structure. Tricuspid valve regurgitation is trivial. No evidence of tricuspid stenosis. Aortic Valve: The aortic valve is grossly normal. There is mild calcification of the aortic valve. There is mild thickening of the aortic valve. Aortic valve regurgitation is trivial. No aortic stenosis is present. Aortic valve peak gradient measures 5.1  mmHg. Pulmonic Valve: The pulmonic valve was normal in structure. Pulmonic valve regurgitation is trivial. No evidence of pulmonic stenosis. Aorta: The aortic root is normal in size and structure. Venous: The inferior vena cava is normal in size with greater than 50% respiratory variability, suggesting right atrial pressure of 3 mmHg. IAS/Shunts: No atrial level shunt detected by color flow Doppler.  LEFT VENTRICLE PLAX 2D LVIDd:         4.50 cm   Diastology LVIDs:         3.27 cm   LV e' medial:    2.42 cm/s LV PW:         1.40 cm   LV E/e' medial:  40.8 LV  IVS:        1.40 cm   LV e' lateral:   3.49 cm/s LVOT diam:     2.00 cm   LV E/e' lateral: 28.3 LV SV:         64 LV SV Index:   37 LVOT Area:     3.14 cm  RIGHT VENTRICLE  IVC RV Basal diam:  3.20 cm     IVC diam: 1.70 cm RV S prime:     17.30 cm/s TAPSE (M-mode): 2.1 cm LEFT ATRIUM             Index        RIGHT ATRIUM           Index LA diam:        3.80 cm 2.22 cm/m   RA Area:     16.60 cm LA Vol (A2C):   66.6 ml 38.95 ml/m  RA Volume:   46.70 ml  27.31 ml/m LA Vol (A4C):   62.7 ml 36.67 ml/m LA Biplane Vol: 67.5 ml 39.47 ml/m  AORTIC VALVE                 PULMONIC VALVE AV Area (Vmax): 3.20 cm     PV Vmax:       1.08 m/s AV Vmax:        113.00 cm/s  PV Peak grad:  4.7 mmHg AV Peak Grad:   5.1 mmHg LVOT Vmax:      115.00 cm/s LVOT Vmean:     63.700 cm/s LVOT VTI:       0.203 m  AORTA Ao Root diam: 3.50 cm Ao Asc diam:  3.50 cm MITRAL VALVE MV Area (PHT): 3.31 cm     SHUNTS MV Decel Time: 229 msec     Systemic VTI:  0.20 m MV E velocity: 98.80 cm/s   Systemic Diam: 2.00 cm MV A velocity: 123.00 cm/s MV E/A ratio:  0.80 Weston Brass MD Electronically signed by Weston Brass MD Signature Date/Time: 05/16/2022/6:14:02 AM    Final    CT ANGIO HEAD NECK W WO CM (CODE STROKE)  Result Date: 05/15/2022 CLINICAL DATA:  Stroke EXAM: CT ANGIOGRAPHY HEAD AND NECK TECHNIQUE: Multidetector CT imaging of the head and neck was performed using the standard protocol during bolus administration of intravenous contrast. Multiplanar CT image reconstructions and MIPs were obtained to evaluate the vascular anatomy. Carotid stenosis measurements (when applicable) are obtained utilizing NASCET criteria, using the distal internal carotid diameter as the denominator. RADIATION DOSE REDUCTION: This exam was performed according to the departmental dose-optimization program which includes automated exposure control, adjustment of the mA and/or kV according to patient size and/or use of iterative reconstruction  technique. CONTRAST:  47mL OMNIPAQUE IOHEXOL 350 MG/ML SOLN COMPARISON:  Same-day noncontrast head CT, CTA head and neck 09/14/2019 FINDINGS: CTA NECK FINDINGS Image quality is degraded by motion artifact particularly at the level of the carotid bifurcation on the right. Aortic arch: There is minimal calcified plaque in the aortic arch. The origins of the major branch vessels are patent. The subclavian arteries are patent to the level imaged. Right carotid system: The right common carotid artery is patent. There is bulky mixed plaque at the bifurcation resulting in up to approximately 60% stenosis, though this is suboptimally evaluated due to motion artifact. The distal right internal carotid artery is patent. The external carotid artery is patent. There is no evidence of dissection or aneurysm. Left carotid system: The left common carotid artery is patent with a patent stent graft. The internal carotid artery is widely patent the external carotid artery appears occluded at its origin with reconstitution of flow in the distal branches unchanged. There is no evidence of dissection or aneurysm. Vertebral arteries: Vertebral arteries are patent, without hemodynamically significant stenosis or occlusion. There is scattered calcified plaque bilaterally, most notably in the  left V3 segment, without greater than mild stenosis. There is no dissection or aneurysm. Skeleton: There is no acute osseous abnormality. There is no visible canal hematoma. There is fluid collection anterior to the left glenohumeral joint which is incompletely imaged. A smaller fluid collection with associated calcified loose bodies are seen on the right. Findings consistent with fluid distention of the subscapularis recesses bilaterally. Other neck: The soft tissues of the neck are unremarkable. Upper chest: The imaged lung apices are clear. Review of the MIP images confirms the above findings CTA HEAD FINDINGS Anterior circulation: Is calcified  plaque in the intracranial ICAs resulting in up to moderate stenosis of the bilateral paraclinoid segments. There is unchanged focal severe stenosis of the right M1 segment (11-102, 12-105). The distal right MCA branches are patent. The left MCA is patent without proximal stenosis or occlusion. The bilateral ACAs are patent with focal mild stenosis of the right A2 segment. There is no other significant stenosis or occlusion. The anterior communicating artery is normal. There is no aneurysm or AVM. Posterior circulation: There is multifocal calcified plaque in the bilateral V4 segments resulting in multifocal moderate to severe stenosis, unchanged. The basilar artery is patent with unchanged multifocal irregularity and up to severe stenosis in the midportion (11-117). The major cerebellar arteries appear patent. There is unchanged focal moderate to severe stenosis of the left P1/P2 junction (11-103, 12-115). There is unchanged focal severe stenosis of the right P1 segment (11-101). Left larger than right posterior communicating arteries are identified. Venous sinuses: Patent. Anatomic variants: As above. Review of the MIP images confirms the above findings IMPRESSION: 1. No emergent large vessel occlusion. 2. Mixed plaque at the right carotid bifurcation resulting in approximately 60% stenosis, degraded by motion artifact. 3. Patent left common/internal carotid artery stent, and patent vertebral arteries in the neck. 4. Overall unchanged intracranial atherosclerotic disease resulting in multifocal moderate to severe stenoses in the anterior and posterior circulations as described in detail above. Notably, there is focal severe stenosis in the mid basilar artery which is unchanged. 5. 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 Electronically Signed   By: Valetta Mole M.D.   On: 05/15/2022 13:11   CT HEAD CODE STROKE WO CONTRAST  Addendum Date:  05/15/2022   ADDENDUM REPORT: 05/15/2022 12:56 ADDENDUM: Upon further review, there is a 12 mm focus of abnormal hypodensity within the right internal capsule/inferior right basal ganglia (series 2, image 15) (series 5, image 34). This is new from the prior head CT of 09/14/2019 and may reflect an acute infarct. Consider a brain MRI for further evaluation. These results were called by telephone at the time of interpretation on 05/15/2022 at 12:50 pm to provider Dr. Rory Percy, who verbally acknowledged these results. Electronically Signed   By: Kellie Simmering D.O.   On: 05/15/2022 12:56   Result Date: 05/15/2022 CLINICAL DATA:  Code stroke. Neuro deficit, acute, stroke suspected. EXAM: CT HEAD WITHOUT CONTRAST TECHNIQUE: Contiguous axial images were obtained from the base of the skull through the vertex without intravenous contrast. RADIATION DOSE REDUCTION: This exam was performed according to the departmental dose-optimization program which includes automated exposure control, adjustment of the mA and/or kV according to patient size and/or use of iterative reconstruction technique. COMPARISON:  Brain MRI 09/14/2019. CT angiogram head/neck 09/14/2019. Non-contrast head CT 09/14/2019. FINDINGS: Brain: Mild generalized cerebral atrophy. Redemonstrated chronic lacunar infarct within the right caudate head. Moderate multifocal T2 FLAIR hyperintense signal abnormality within the  cerebral white matter, nonspecific but compatible with chronic small-vessel image disease. There is no acute intracranial hemorrhage. No demarcated cortical infarct. No extra-axial fluid collection. No evidence of an intracranial mass. No midline shift. Vascular: No hyperdense vessel. Atherosclerotic calcifications. Skull: No fracture or aggressive osseous lesion. Sinuses/Orbits: No mass or acute finding within the imaged orbits. No significant paranasal sinus disease at the imaged levels. ASPECTS (Alberta Stroke Program Early CT Score) - Ganglionic  level infarction (caudate, lentiform nuclei, internal capsule, insula, M1-M3 cortex): 7 - Supraganglionic infarction (M4-M6 cortex): 3 Total score (0-10 with 10 being normal): 10 Attempts are being made to reach the ordering provider at this time. IMPRESSION: 1. No evidence of acute intracranial abnormality. 2. Redemonstrated chronic lacunar infarct within the right caudate head. 3. Moderate chronic small vessel ischemic changes within the cerebral white matter. 4. Mild generalized cerebral atrophy. Electronically Signed: By: Jackey Loge D.O. On: 05/15/2022 12:45    PHYSICAL EXAM  Temp:  [97.9 F (36.6 C)-99.2 F (37.3 C)] 98.9 F (37.2 C) (10/06 1200) Pulse Rate:  [65-86] 68 (10/06 1400) Resp:  [12-23] 15 (10/06 1400) BP: (130-201)/(42-128) 183/79 (10/06 1400) SpO2:  [85 %-99 %] 92 % (10/06 1400)  General - Well nourished, well developed, in no apparent distress. Cardiovascular - Regular rhythm and rate.  Mental Status -  Level of arousal and orientation to time, place, and person were intact. Language including expression, naming, repetition, comprehension was assessed and found intact. Attention span and concentration were normal. Recent and remote memory were intact. Fund of Knowledge was assessed and was intact.  Cranial Nerves II - XII - II - Visual field intact OU. III, IV, VI - Extraocular movements intact. V - Facial sensation intact bilaterally. VII - Facial movement intact bilaterally. VIII - Hearing & vestibular intact bilaterally. X - Palate elevates symmetrically. XI - Chin turning & shoulder shrug intact bilaterally. XII - Tongue protrusion intact.  Motor Strength - The patient's strength was normal in all extremities and pronator drift was absent.  Bulk was normal and fasciculations were absent.   Motor Tone - Muscle tone was assessed at the neck and appendages and was normal.  Sensory - Light touch, temperature/pinprick were assessed and were symmetrical.     Coordination - The patient had normal movements in the hands and feet with no ataxia or dysmetria.  Tremor was absent.  Gait and Station - deferred.   ASSESSMENT/PLAN Margaret Butler is a 81 y.o. female with history of HLD, HTN, CAD, TIA, stroke, bipolar disorder, PAD and bilateral carotid stenosis s/p stenting of left carotid artery who presents with sudden onset left sided weakness, left facial droop, slurred speech, left sided sensory deficit, left sided neglect and left upper quandrantopia.  Patient had just finished a water aerobics class and was waiting on a bench when she experienced sudden onset of the above symptoms.  EMS was called, they found her to have flaccid left hemiparesis and she was brought to the ED as a code stroke.  Symptoms improved on the way to the hospital but not completely resolved. She received IV TNK.   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 Redemonstrated chronic lacunar infarct within the right caudate head. CTA head & neck Mixed plaque at the right carotid bifurcation resulting in approximately 60% stenosis, degraded  by motion artifact. Patent left CCA/ICA stent. unchanged intracranial atherosclerotic disease resulting in multifocal moderate to severe stenoses in the anterior and posterior circulations. Focal severe stenosis in the mid basilar artery which is unchanged. MRI Acute infarct in the right medial temporal lobe and posterior limb of the internal capsule, possibly anterior choroidal. 2D Echo EF 50-55% Carotid Doppler pending LDL 56 HgbA1c 6.8 VTE prophylaxis - SCD"s aspirin 81 mg daily prior to admission, now on aspirin 325 mg daily and clopidogrel 75 mg daily.  Further regimen per vascular surgery. Therapy recommendations:  pending Disposition:   pending  Carotid stenosis  S/p left CEA 02/2019 with Dr. Randie Heinz CTA head & neck Mixed plaque at the right carotid bifurcation resulting in approximately 60% stenosis MRI stroke consistent with R AchA territory Vascular surgery consulted for evaluation  Carotid Doppler pending  History of stroke/TIA 02/2019 admitted for transient aphasia.  CT no acute abnormality.  MRI negative for stroke.  CTA head and neck showed severe intracranial stenosis including right ICA siphon, bilateral V4, basilar artery, right M1 and M2.  Bilateral ICA 50 to 60% stenosis.  Carotid Doppler showed right ICA 40 to 59% stenosis, left ICA 60 to 79% stenosis.  EF 60 to 60%.  LDL 122, A1c 7.2, UDS negative.  Discharged on DAPT and Zocor 20. Had left CEA 02/2019 with Dr. Randie Heinz.  Complicated by vascular reocclusion, status post redo CEA 2 weeks later. 09/2019 admitted for confusion on awakening in the setting of hyponatremia and hypertension.  MRI no acute abnormality.  CT head and neck unchanged severe stenosis right M1, right ICA 54% stenosis, severe mid basilar stenosis unchanged.  Hypertension Hypotension on presentation Home meds:  hydralazine, losartan BP 80s on ED presentation Now stable on the high end On Zocor 25 Orthostatic vital pending Avoid low BP given severe intracranial and extracranial stenosis Long-term BP goal normotensive  Hyperlipidemia Home meds:  repatha, not resumed in hospital LDL 56, goal < 70  Continue Repatha at discharge  Other Stroke Risk Factors Advanced Age >/= 74  Coronary artery disease status post stenting PAD  Other Active Problems Bipolar disorder -on lamotrigine Leukocytosis WBC 12.0  Hospital day # 1  Gevena Mart DNP, ACNPC-AG   ATTENDING NOTE: I reviewed above note and agree with the assessment and plan. Pt was seen and examined.   81 years old female with history of hypertension, hyperlipidemia, CAD status post stenting, TIA, bilateral carotid stenosis status post  left CEA, PAD admitted for dizziness, whole body weakness, difficulty walking per patient.  However, daughter and note indicate patient had left-sided weakness, numbness, left facial droop left neglect.  Found to have low BP at 80s, responded to IV bolus, symptoms improved in the ER.  CT showed old right carotid infarct and possible new right BG infarct.  CT head and neck right ICA 60% stenosis, multifocal intracranial stenosis including PA, left V4, bilateral siphon, right M1.  MRI showed right optic radiation and PLIC infarcts, consistent with AchA territory.  EF 50 to 55%, LDL 56, A1c 6.8, UDS negative.  Creatinine 0.6, WBC 12.0.  Carotid Doppler pending  Patient has history of TIA in 02/2019 for transient aphasia.  CT no acute abnormality. MRI negative for stroke.  CTA head and neck showed severe intracranial stenosis including right ICA siphon, bilateral V4, basilar artery, right M1 and M2.  Bilateral ICA 50 to 60% stenosis.  Carotid Doppler showed right ICA 40 to 59% stenosis, left ICA 60 to 79% stenosis.  EF  60 to 60%.  LDL 122, A1c 7.2, UDS negative.  Discharged on DAPT and Zocor 20. Had left CEA 02/2019 with Dr. Randie Heinz.   On exam, patient daughter and husband at bedside.  Patient lying in bed, neurologically intact except slight left nasolabial fold flattening.  Etiology for patient stroke likely due to right ICA high-grade stenosis in the setting of hypotension.  Recommend vascular surgery consultation to consider right CEA versus TCAR.  Continue aspirin and Plavix and home Repatha.  Avoid low BP.  Orthostatic vital pending.  For detailed assessment and plan, please refer to above/below as I have made changes wherever appropriate.   Marvel Plan, MD PhD Stroke Neurology 05/16/2022 5:56 PM  This patient is critically ill due to stroke status post TNK, history of TIA, severe carotid stenosis and intracranial stenosis, hypotension on presentation and at significant risk of neurological worsening, death  form recurrent stroke, hemorrhagic transformation. This patient's care requires constant monitoring of vital signs, hemodynamics, respiratory and cardiac monitoring, review of multiple databases, neurological assessment, discussion with family, other specialists and medical decision making of high complexity. I spent 40 minutes of neurocritical care time in the care of this patient. I had long discussion with daughter and husband at bedside, updated pt current condition, treatment plan and potential prognosis, and answered all the questions.  They expressed understanding and appreciation.      To contact Stroke Continuity provider, please refer to [email or web address]. After hours, contact General Neurology

## 2022-05-16 NOTE — Progress Notes (Signed)
OT Cancellation Note  Patient Details Name: Marisabel Macpherson MRN: [id number] DOB: 1941/05/08   Cancelled Treatment:    Reason Eval/Treat Not Completed: Active bedrest order OT order received and appreciated however this conflicts with current bedrest order set. Please increase activity tolerance as appropriate and remove bedrest from orders. . Please contact OT at [phone] if bed rest order is discontinued. OT will hold evaluation at this time and will check back as time allows pending increased activity orders.   Jeri Modena 05/16/2022, 8:20 AM

## 2022-05-16 NOTE — Consult Note (Signed)
Hospital Consult    Reason for Consult: Right-sided stroke with ICA stenosis Requesting Physician: Neurology MRN #:  [id number]  History of Present Illness: This is a 81 y.o. female with history of peripheral arterial disease.  Surgical history of left-sided CEA, TCAR with Dr. Randie Heinz years ago.  Patient had an episode yesterday after swimming at the [facility] with significant dizziness, unable to walk straight.  24-hour MRI demonstrated acute infarct in the right medial temporal lobe, posterior limb of the internal capsule.  Greater than 50% stenosis of the right internal carotid artery.  Vascular surgery was consulted for further recommendations.  On exam, Margaret Butler stated she was feeling better.  She continues to have some baseline dizziness.  Otherwise, feels normal.  She denied unilateral deficits, speech deficit, no paresthesias.  Past Medical History:  Diagnosis Date   Anemia    Low iron in the past   Arthritis    Constipation    Coronary artery disease    stent in 2019   Depression    "in the past"   DM type 2 (diabetes mellitus, type 2) (HCC) 02/16/2019   GERD (gastroesophageal reflux disease)    "years ago"   Hyperlipidemia    Hypertension    Left carotid stenosis 02/16/2019   Stroke (HCC)    TIA (transient ischemic attack) 02/16/2019    Past Surgical History:  Procedure Laterality Date   ABDOMINAL HYSTERECTOMY     CORONARY ANGIOPLASTY     ENDARTERECTOMY Left 02/15/2019   Procedure: ENDARTERECTOMY CAROTID;  Surgeon: Maeola Harman, MD;  Location: [facility] OR;  Service: Vascular;  Laterality: Left;   JOINT REPLACEMENT Right    knee   PATCH ANGIOPLASTY Left 02/15/2019   Procedure: Patch Angioplasty Using Hemoshield patch size 0.8cm x 7.6cm;  Surgeon: Maeola Harman, MD;  Location: [facility] OR;  Service: Vascular;  Laterality: Left;   TRANSCAROTID ARTERY REVASCULARIZATION  Left 07/20/2019   Procedure: LEFT TRANSCAROTID ARTERY REVASCULARIZATION  with 60mm x 32mm and 28mm x 30  mm stent placement;  Surgeon: Maeola Harman, MD;  Location: [facility] OR;  Service: Vascular;  Laterality: Left;    Allergies  Allergen Reactions   Lipitor [Atorvastatin] Other (See Comments)    Muscle pain   Zestril [Lisinopril] Cough        Zoloft [Sertraline] Other (See Comments)    Caused headaches     Prior to Admission medications   Medication Sig Start Date End Date Taking? Authorizing Provider  aspirin EC 81 MG tablet Take 81 mg by mouth daily.   Yes [provider]  Cholecalciferol (VITAMIN D) 50 MCG (2000 UT) tablet Take 2,000 Units by mouth daily.   Yes [provider]  hydrALAZINE (APRESOLINE) 50 MG tablet Take 50 mg by mouth 2 (two) times daily. 09/09/19  Yes [provider]  lamoTRIgine (LAMICTAL) 25 MG tablet Take 25 mg by mouth daily. 05/07/22  Yes [provider]  losartan (COZAAR) 100 MG tablet Take 100 mg by mouth daily. 05/01/22  Yes [provider]  REPATHA SURECLICK 140 MG/ML SOAJ Inject 140 mg into the skin See admin instructions. Every 2 weeks on the 1st and 15th of each month 02/12/22  Yes [provider]    Social History   Socioeconomic History   Marital status: Married    Spouse name: Not on file   Number of children: Not on file   Years of education: Not on file   Highest education level: Not on file  Occupational History  Not on file  Tobacco Use   Smoking status: Never   Smokeless tobacco: Never  Vaping Use   Vaping Use: Never used  Substance and Sexual Activity   Alcohol use: Never   Drug use: Never   Sexual activity: Not on file  Other Topics Concern   Not on file  Social History Narrative   Not on file   Social Determinants of Health   Financial Resource Strain: Not on file  Food Insecurity: Not on file  Transportation Needs: Not on file  Physical Activity: Not on file  Stress: Not on file  Social Connections: Not on file  Intimate Partner Violence: Not on file   Family  History  Problem Relation Age of Onset   Heart attack Father     ROS: Otherwise negative unless mentioned in HPI  Physical Examination  Vitals:   05/16/22 1400 05/16/22 1500  BP: (!) 183/79 (!) 179/59  Pulse: 68 70  Resp: 15 16  Temp:    SpO2: 92% 93%   Body mass index is 25.75 kg/m.  General:  WDWN in NAD Gait: Not observed HENT: WNL, normocephalic Pulmonary: normal non-labored breathing, without Rales, rhonchi,  wheezing Cardiac: regular Abdomen:  soft, NT/ND, no masses Skin: without rashes Vascular Exam/Pulses: 2+ radial pulses bilaterally Extremities: without ischemic changes, without Gangrene , without cellulitis; without open wounds;  Musculoskeletal: no muscle wasting or atrophy  Neurologic: A&O X 3;  No focal weakness or paresthesias are detected; speech is fluent/normal Psychiatric:  The pt has Normal affect. Lymph:  Unremarkable  CBC    Component Value Date/Time   WBC 12.0 (H) 05/15/2022 1227   RBC 4.75 05/15/2022 1227   HGB 12.6 05/15/2022 1244   HCT 37.0 05/15/2022 1244   PLT 242 05/15/2022 1227   MCV 86.3 05/15/2022 1227   MCH 27.8 05/15/2022 1227   MCHC 32.2 05/15/2022 1227   RDW 13.8 05/15/2022 1227   LYMPHSABS 1.9 05/15/2022 1227   MONOABS 0.9 05/15/2022 1227   EOSABS 0.1 05/15/2022 1227   BASOSABS 0.1 05/15/2022 1227    BMET    Component Value Date/Time   NA 140 05/15/2022 1244   K 3.7 05/15/2022 1244   CL 104 05/15/2022 1244   CO2 22 05/15/2022 1227   GLUCOSE 135 (H) 05/15/2022 1244   BUN 10 05/15/2022 1244   CREATININE 0.60 05/15/2022 1244   CALCIUM 8.8 (L) 05/15/2022 1227   GFRNONAA >60 05/15/2022 1227   GFRAA >60 09/16/2019 0936    COAGS: Lab Results  Component Value Date   INR 1.0 05/15/2022   INR 1.0 09/14/2019   INR 0.9 07/20/2019     Non-Invasive Vascular Imaging:    Right carotid system: The right common carotid artery is patent. There is bulky mixed plaque at the bifurcation resulting in up to approximately 60%  stenosis, though this is suboptimally evaluated due to motion artifact. The distal right internal carotid artery is patent. The external carotid artery is patent. There is no evidence of dissection or aneurysm.   ASSESSMENT/PLAN: This is a 81 y.o. female status post recent right-sided stroke.  There is question of whether this is small vessel disease versus atheroembolic event.  CTA demonstrated roughly 60% stenosis of the right internal carotid artery.  I have ordered an ultrasound to further investigate the level of stenosis. Had a long conversation with the patient and her family regarding carotid revascularization in an effort to decrease her future stroke risk.  I quoted her a greater than 15%  risk reduction in stroke at 2 years with carotid revascularization-26% to 9% with surgery.  Has been performed by Dr. Lemar Livings, and both the patient and her husband asked to see him to schedule her next operation.  I have discussed this with Dr. Randie Heinz who will see the patient and work on timing pending the carotid ultrasound study.  Patient would benefit from dual antiplatelet therapy, high intensity statin.   DAPT needs to be started today as it requires 5 days of administration to reach steady state.  I discussed both transcarotid artery revascularization versus carotid endarterectomy, I will leave the final decision to Dr. Randie Heinz and Hoover Brunette.  Fara Olden MD MS [facility] [phone] 05/16/2022  4:20 PM

## 2022-05-16 NOTE — Progress Notes (Signed)
PT Cancellation Note  Patient Details Name: Margaret Butler MRN: [id number] DOB: 11/03/1940   Cancelled Treatment:    Reason Eval/Treat Not Completed: Active bedrest order; will await updated activity orders prior to PT evaluation.   Reginia Naas 05/16/2022, 8:34 AM Magda Kiel, PT Acute Rehabilitation Services Office:[phone] 05/16/2022

## 2022-05-16 NOTE — Progress Notes (Signed)
  Transition of Care [facility]) Screening Note   Patient Details  Name: Margaret Butler Date of Birth: 01/24/1941   Transition of Care [facility]) CM/SW Contact:    Pollie Friar, RN Phone Number: 05/16/2022, 3:38 PM    Transition of Care Department [facility]) has reviewed patient. Pt is from home. Positive for CVA.  We will continue to monitor patient advancement through interdisciplinary progression rounds. If new patient transition needs arise, please place a TOC consult.

## 2022-05-17 ENCOUNTER — Inpatient Hospital Stay (HOSPITAL_BASED_OUTPATIENT_CLINIC_OR_DEPARTMENT_OTHER): Payer: Medicare PPO

## 2022-05-17 DIAGNOSIS — I6521 Occlusion and stenosis of right carotid artery: Secondary | ICD-10-CM | POA: Diagnosis not present

## 2022-05-17 DIAGNOSIS — I639 Cerebral infarction, unspecified: Secondary | ICD-10-CM

## 2022-05-17 LAB — CBC
HCT: 37.5 % (ref 36.0–46.0)
Hemoglobin: 12.2 g/dL (ref 12.0–15.0)
MCH: 27.5 pg (ref 26.0–34.0)
MCHC: 32.5 g/dL (ref 30.0–36.0)
MCV: 84.5 fL (ref 80.0–100.0)
Platelets: 226 10*3/uL (ref 150–400)
RBC: 4.44 MIL/uL (ref 3.87–5.11)
RDW: 13.7 % (ref 11.5–15.5)
WBC: 8.1 10*3/uL (ref 4.0–10.5)
nRBC: 0 % (ref 0.0–0.2)

## 2022-05-17 LAB — BASIC METABOLIC PANEL
Anion gap: 8 (ref 5–15)
BUN: 12 mg/dL (ref 8–23)
CO2: 22 mmol/L (ref 22–32)
Calcium: 8.9 mg/dL (ref 8.9–10.3)
Chloride: 109 mmol/L (ref 98–111)
Creatinine, Ser: 0.56 mg/dL (ref 0.44–1.00)
GFR, Estimated: >60 mL/min (ref >60)
Glucose, Bld: 149 mg/dL — ABNORMAL HIGH (ref 70–99)
Potassium: 3.7 mmol/L (ref 3.5–5.1)
Sodium: 139 mmol/L (ref 135–145)

## 2022-05-17 MED ORDER — LOSARTAN POTASSIUM 50 MG PO TABS
50.0000 mg | ORAL_TABLET | Freq: Every day | ORAL | Status: DC
  Administered 2022-05-17: 50 mg via ORAL
  Filled 2022-05-17: qty 1

## 2022-05-17 MED ORDER — ENOXAPARIN SODIUM 40 MG/0.4ML IJ SOSY
40.0000 mg | PREFILLED_SYRINGE | INTRAMUSCULAR | Status: DC
  Administered 2022-05-17 – 2022-05-18 (×2): 40 mg via SUBCUTANEOUS
  Filled 2022-05-17 (×2): qty 0.4

## 2022-05-17 MED ORDER — LOSARTAN POTASSIUM 50 MG PO TABS
100.0000 mg | ORAL_TABLET | Freq: Every day | ORAL | Status: DC
  Administered 2022-05-18 – 2022-05-19 (×2): 100 mg via ORAL
  Filled 2022-05-17 (×3): qty 2

## 2022-05-17 NOTE — Progress Notes (Signed)
VASCULAR LAB    Carotid duplex has been performed.  See CV proc for preliminary results.   Mattthew Ziomek, RVT 05/17/2022, 9:16 AM

## 2022-05-17 NOTE — Evaluation (Signed)
Occupational Therapy Evaluation Patient Details Name: Margaret Butler MRN: [id number] DOB: 13-May-1941 Today's Date: 05/17/2022   History of Present Illness 81 yo female presented to ED code stroke with significant L sided weakenss, L facial droop slurred speech L sensory deficits and L upper quandrantopia. Imaging (+) acute R temporal lobe and internal capsule infarcts. PMHx significant for HLD, HTN, CAD, Hx of TIA, Hx of CVA, bipolar disorder and PAD.   Clinical Impression   PTA patient was living with her spouse in a multi-level private residence with 5 STE and bilateral rails. Patient was grossly Mod I with ADLs/IADLs and intermittent use of RW. Patient currently functioning below baseline demonstrating observed ADLs including toileting with Min guard to Min A. Patient also limited by deficits listed below including L hemiparesis, blurry vision, decreased dynamic standing balance, and decreased activity tolerance and would benefit from continued acute OT services in prep for safe d/c home with family. OT will continue to follow acutely.        Recommendations for follow up therapy are one component of a multi-disciplinary discharge planning process, led by the attending physician.  Recommendations may be updated based on patient status, additional functional criteria and insurance authorization.   Follow Up Recommendations  Outpatient OT    Assistance Recommended at Discharge Frequent or constant Supervision/Assistance  Patient can return home with the following A little help with walking and/or transfers;A little help with bathing/dressing/bathroom;Assistance with cooking/housework;Assist for transportation    Functional Status Assessment  Patient has had a recent decline in their functional status and demonstrates the ability to make significant improvements in function in a reasonable and predictable amount of time.  Equipment Recommendations  None recommended by OT    Recommendations  for Other Services       Precautions / Restrictions Precautions Precautions: Fall      Mobility Bed Mobility               General bed mobility comments: Seated in recliner upon entry.    Transfers Overall transfer level: Needs assistance Equipment used: Rolling walker (2 wheels) Transfers: Sit to/from Stand Sit to Stand: Min guard           General transfer comment: increased time, cues for sequencing      Balance Overall balance assessment: Needs assistance Sitting-balance support: Feet supported, No upper extremity supported Sitting balance-Leahy Scale: Fair     Standing balance support: Bilateral upper extremity supported, During functional activity, Reliant on assistive device for balance Standing balance-Leahy Scale: Poor Standing balance comment: Reliant on BUE support                           ADL either performed or assessed with clinical judgement   ADL Overall ADL's : Needs assistance/impaired Eating/Feeding: Set up;Sitting   Grooming: Set up;Sitting   Upper Body Bathing: Set up;Sitting   Lower Body Bathing: Minimal assistance;Sit to/from stand   Upper Body Dressing : Set up;Sitting   Lower Body Dressing: Minimal assistance;Sit to/from stand   Toilet Transfer: Min guard;Ambulation   Toileting- Clothing Manipulation and Hygiene: Min guard;Sit to/from stand         General ADL Comments: Min guard to Min A overall secondary to L hemiparesis     Vision Baseline Vision/History: 1 Wears glasses Ability to See in Adequate Light: 1 Impaired Patient Visual Report: Blurring of vision Vision Assessment?: Vision impaired- to be further tested in functional context Additional Comments: Blurry vision in  both eyes x1 week; appears to be in all quadrants of visual field     Perception     Praxis      Pertinent Vitals/Pain Pain Assessment Pain Assessment: No/denies pain     Hand Dominance Right   Extremity/Trunk Assessment  Upper Extremity Assessment Upper Extremity Assessment: LUE deficits/detail LUE Deficits / Details: 3+/5 LUE at shoulder and digits. 4/5 at elbow. LUE Sensation: WNL LUE Coordination: decreased fine motor   Lower Extremity Assessment Lower Extremity Assessment: Defer to PT evaluation LLE Deficits / Details: grossly 4/5 (as compared to 5/5 on R) LLE Sensation: WNL   Cervical / Trunk Assessment Cervical / Trunk Assessment: Normal   Communication Communication Communication: HOH   Cognition Arousal/Alertness: Awake/alert Behavior During Therapy: WFL for tasks assessed/performed Overall Cognitive Status: Within Functional Limits for tasks assessed                                       General Comments  Orthostatic BP: sup 184/54, sit 170/66, stand 164/65, after amb to bathroom 198/58. Returned to 170/54 after 3 minute rest in recliner with feet elevated.    Exercises     Shoulder Instructions      Home Living Family/patient expects to be discharged to:: Private residence Living Arrangements: Spouse/significant other Available Help at Discharge: Family;Available 24 hours/day Type of Home: House Home Access: Stairs to enter [employer] of Steps: 4 Entrance Stairs-Rails: Right;Left;Can reach both Home Layout: Multi-level;Able to live on main level with bedroom/bathroom     Bathroom Shower/Tub: Walk-in shower   Bathroom Toilet: Handicapped height     Home Equipment: Advice worker (2 wheels);Grab bars - tub/shower      Lives With: Spouse    Prior Functioning/Environment Prior Level of Function : History of Falls (last six months);Independent/Modified Independent             Mobility Comments: Multi episodes over past year when she needed RW due to falls/balance. No AD at baseline. Pt does drive when feeling well. ADLs Comments: mod I ADLs        OT Problem List: Decreased strength;Decreased range of motion;Decreased  activity tolerance;Impaired balance (sitting and/or standing);Impaired vision/perception;Decreased coordination      OT Treatment/Interventions: Self-care/ADL training;Therapeutic exercise;Energy conservation;Therapeutic activities;Patient/family education;Balance training    OT Goals(Current goals can be found in the care plan section) Acute Rehab OT Goals Patient Stated Goal: To get stronger. OT Goal Formulation: With patient Time For Goal Achievement: 05/31/22 Potential to Achieve Goals: Good  OT Frequency: Min 2X/week    Co-evaluation              AM-PAC OT "6 Clicks" Daily Activity     Outcome Measure Help from another person eating meals?: A Little Help from another person taking care of personal grooming?: A Little Help from another person toileting, which includes using toliet, bedpan, or urinal?: A Little Help from another person bathing (including washing, rinsing, drying)?: A Little Help from another person to put on and taking off regular upper body clothing?: A Little Help from another person to put on and taking off regular lower body clothing?: A Little 6 Click Score: 18   End of Session Equipment Utilized During Treatment: Gait belt;Rolling walker (2 wheels) Nurse Communication: Mobility status  Activity Tolerance: Patient tolerated treatment well Patient left: in chair;with call bell/phone within reach;with family/visitor present  OT Visit Diagnosis: Unsteadiness on feet (  R26.81);Repeated falls (R29.6);Muscle weakness (generalized) (M62.81);Hemiplegia and hemiparesis Hemiplegia - Right/Left: Left Hemiplegia - dominant/non-dominant: Non-Dominant Hemiplegia - caused by: Cerebral infarction                Time: 6578-4696 OT Time Calculation (min): 15 min Charges:  OT General Charges $OT Visit: 1 Visit OT Evaluation $OT Eval Moderate Complexity: 1 Mod  Aara Jacquot H. OTR/L Supplemental OT, Department of rehab services [phone]  Easton Sivertson R  H. 05/17/2022, 1:23 PM

## 2022-05-17 NOTE — Evaluation (Signed)
Physical Therapy Evaluation Patient Details Name: Margaret Butler MRN: [id number] DOB: Jan 03, 1941 Today's Date: 05/17/2022  History of Present Illness  81 yo female presented to ED code stroke with significant L sided weakenss, L facial droop slurred speech L sensory deficits and L upper quandrantopia. Imaging (+) acute R temporal lobe and internal capsule infarcts. PMHx significant for HLD, HTN, CAD, Hx of TIA, Hx of CVA, bipolar disorder and PAD.   Clinical Impression  Pt admitted with above diagnosis. PTA pt lived at home with husband, I/mod I mobility and ADLs occasional use of RW. Pt currently with functional limitations due to the deficits listed below (see PT Problem List). On eval, pt required min guard assist transfers and amb 10' x 2 with RW. Gait distance limited by dizziness and lightheadedness. Pt will benefit from skilled PT to increase their independence and safety with mobility to allow discharge to the venue listed below.  Suspect pt will progress well with mobility as BP stabilizes and dizziness resolves. Pt has good family support.                                                       BP Supine                                    184/54 Sit                                            170/66 Stand                                      164/65 After amb to BR                     198/58 After 3 min in recliner           170/54          Recommendations for follow up therapy are one component of a multi-disciplinary discharge planning process, led by the attending physician.  Recommendations may be updated based on patient status, additional functional criteria and insurance authorization.  Follow Up Recommendations Outpatient PT      Assistance Recommended at Discharge Frequent or constant Supervision/Assistance  Patient can return home with the following  A little help with walking and/or transfers;Assistance with cooking/housework;Assist for transportation;A little help with  bathing/dressing/bathroom;Help with stairs or ramp for entrance    Equipment Recommendations None recommended by PT  Recommendations for Other Services       Functional Status Assessment Patient has had a recent decline in their functional status and demonstrates the ability to make significant improvements in function in a reasonable and predictable amount of time.     Precautions / Restrictions Precautions Precautions: Fall      Mobility  Bed Mobility               General bed mobility comments: Received in recliner    Transfers Overall transfer level: Needs assistance Equipment used: Rolling walker (2 wheels) Transfers: Sit to/from Stand Sit to Stand: Min  guard           General transfer comment: increased time, cues for sequencing    Ambulation/Gait Ambulation/Gait assistance: Min guard Gait Distance (Feet): 10 Feet (x 2) Assistive device: Rolling walker (2 wheels) Gait Pattern/deviations: Step-through pattern, Decreased stride length, Drifts right/left Gait velocity: decreased Gait velocity interpretation: <1.31 ft/sec, indicative of household ambulator   General Gait Details: mildly unsteady with decreased RW control on L  Stairs            Wheelchair Mobility    Modified Rankin (Stroke Patients Only) Modified Rankin (Stroke Patients Only) Pre-Morbid Rankin Score: No significant disability Modified Rankin: Moderately severe disability     Balance Overall balance assessment: Needs assistance Sitting-balance support: Feet supported, No upper extremity supported Sitting balance-Leahy Scale: Fair     Standing balance support: Bilateral upper extremity supported, During functional activity, Reliant on assistive device for balance Standing balance-Leahy Scale: Poor                               Pertinent Vitals/Pain Pain Assessment Pain Assessment: No/denies pain    Home Living Family/patient expects to be discharged to::  Private residence Living Arrangements: Spouse/significant other Available Help at Discharge: Family;Available 24 hours/day Type of Home: House Home Access: Stairs to enter Entrance Stairs-Rails: Right;Left;Can reach both Entrance Stairs-Number of Steps: 4   Home Layout: Multi-level;Able to live on main level with bedroom/bathroom Home Equipment: Shower seat;Rolling Walker (2 wheels);Grab bars - tub/shower      Prior Function Prior Level of Function : History of Falls (last six months);Independent/Modified Independent             Mobility Comments: Multi episodes over past year when she needed RW due to falls/balance. No AD at baseline. Pt does drive when feeling well. ADLs Comments: mod I ADLs     Hand Dominance   Dominant Hand: Right    Extremity/Trunk Assessment   Upper Extremity Assessment Upper Extremity Assessment: Defer to OT evaluation    Lower Extremity Assessment Lower Extremity Assessment: LLE deficits/detail LLE Deficits / Details: grossly 4/5 (as compared to 5/5 on R) LLE Sensation: WNL    Cervical / Trunk Assessment Cervical / Trunk Assessment: Normal  Communication   Communication: HOH  Cognition Arousal/Alertness: Awake/alert Behavior During Therapy: WFL for tasks assessed/performed Overall Cognitive Status: Within Functional Limits for tasks assessed                                          General Comments General comments (skin integrity, edema, etc.): Orthostatic BP: sup 184/54, sit 170/66, stand 164/65, after amb to bathroom 198/58. Returned to 170/54 after 3 minute rest in recliner with feet elevated.    Exercises     Assessment/Plan    PT Assessment Patient needs continued PT services  PT Problem List Decreased strength;Decreased mobility;Decreased activity tolerance;Decreased balance       PT Treatment Interventions Therapeutic activities;Gait training;Therapeutic exercise;Patient/family education;Balance  training;Stair training;Functional mobility training    PT Goals (Current goals can be found in the Care Plan section)  Acute Rehab PT Goals Patient Stated Goal: home after vascular procedure PT Goal Formulation: With patient/family Time For Goal Achievement: 05/31/22 Potential to Achieve Goals: Good    Frequency Min 4X/week     Co-evaluation  AM-PAC PT "6 Clicks" Mobility  Outcome Measure Help needed turning from your back to your side while in a flat bed without using bedrails?: A Little Help needed moving from lying on your back to sitting on the side of a flat bed without using bedrails?: A Little Help needed moving to and from a bed to a chair (including a wheelchair)?: A Little Help needed standing up from a chair using your arms (e.g., wheelchair or bedside chair)?: A Little Help needed to walk in hospital room?: A Little Help needed climbing 3-5 steps with a railing? : A Lot 6 Click Score: 17    End of Session Equipment Utilized During Treatment: Gait belt Activity Tolerance: Treatment limited secondary to medical complications (Comment) (dizziness, lightheaded) Patient left: in chair;with call bell/phone within reach;with family/visitor present Nurse Communication: Mobility status;Other (comment) (BP) PT Visit Diagnosis: Unsteadiness on feet (R26.81);History of falling (Z91.81);Difficulty in walking, not elsewhere classified (R26.2)    Time: 3007-6226 PT Time Calculation (min) (ACUTE ONLY): 28 min   Charges:   PT Evaluation $PT Eval Moderate Complexity: 1 Mod PT Treatments $Gait Training: 8-22 mins        Aida Raider, PT  Office # [phone] Pager [phone]   Ilda Foil 05/17/2022, 12:22 PM

## 2022-05-17 NOTE — Progress Notes (Addendum)
STROKE TEAM PROGRESS NOTE   INTERVAL HISTORY Her family  is at the bedside.  She is sitting in the chair in NAD. Patient states she is still dizzy with blurred vision.  Increase cozaar to 50mg  due to hypertension   Will check orthostatics today-sup 184/54, sit 170/66, stand 164/65, after amb to bathroom 198/58  Vitals:   05/17/22 0358 05/17/22 0435 05/17/22 0623 05/17/22 0754  BP: (!) 195/55 (!) 159/45 (!) 167/53 (!) 188/55  Pulse: 66 63  71  Resp: 16 16  16   Temp: 76.7 F (36.6 C) 98.4 F (36.9 C)  98.3 F (36.8 C)  TempSrc: Oral Oral  Oral  SpO2: 97% 96%  97%  Weight:      Height:       CBC:  Recent Labs  Lab 05/15/22 1227 05/15/22 1244 05/17/22 0342  WBC 12.0*  --  8.1  NEUTROABS 9.0*  --   --   HGB 13.2 12.6 12.2  HCT 41.0 37.0 37.5  MCV 86.3  --  84.5  PLT 242  --  341    Basic Metabolic Panel:  Recent Labs  Lab 05/15/22 1227 05/15/22 1244 05/17/22 0342  NA 139 140 139  K 3.9 3.7 3.7  CL 109 104 109  CO2 22  --  22  GLUCOSE 132* 135* 149*  BUN 10 10 12   CREATININE 0.79 0.60 0.56  CALCIUM 8.8*  --  8.9    Lipid Panel:  Recent Labs  Lab 05/16/22 0326  CHOL 150  TRIG 216*  HDL 51  CHOLHDL 2.9  VLDL 43*  LDLCALC 56    HgbA1c:  Recent Labs  Lab 05/15/22 1227  HGBA1C 6.8*    Urine Drug Screen:  Recent Labs  Lab 05/15/22 1224  LABOPIA NONE DETECTED  COCAINSCRNUR NONE DETECTED  LABBENZ NONE DETECTED  AMPHETMU NONE DETECTED  THCU NONE DETECTED  LABBARB NONE DETECTED     Alcohol Level  Recent Labs  Lab 05/15/22 1246  ETH <10     IMAGING past 24 hours VAS US CAROTID  Result Date: 05/17/2022 Carotid Arterial Duplex Study Patient Name:  DAKARI STABLER  Date of Exam:   05/17/2022 Medical Rec #: [id number]        Accession #:    [id number] Date of Birth: 03/25/1941        Patient Gender: F Patient Age:   81 years Exam Location:  [facility] Procedure:      VAS US CAROTID Referring Phys: Vonna Kotyk ROBINS  --------------------------------------------------------------------------------  Indications:       CVA, preoperative right TCAR. Left endarterectomy 02/15/19.                    Left TCAR 07/20/19. Risk Factors:      Hypertension, hyperlipidemia, coronary artery disease, PAD. Comparison Study:  Prior Carotid duplex indicating 40-59% right ICA stenosis                    done 06/06/19 Performing Technologist: Sharion Dove RVS  Examination Guidelines: A complete evaluation includes B-mode imaging, spectral Doppler, color Doppler, and power Doppler as needed of all accessible portions of each vessel. Bilateral testing is considered an integral part of a complete examination. Limited examinations for reoccurring indications may be performed as noted.  Right Carotid Findings: +----------+--------+--------+--------+------------------+---------+           PSV cm/sEDV cm/sStenosisPlaque DescriptionComments  +----------+--------+--------+--------+------------------+---------+ CCA Prox  61      11                                          +----------+--------+--------+--------+------------------+---------+  CCA Distal79      11                                          +----------+--------+--------+--------+------------------+---------+ ICA Prox  215     62      60-79%  calcific          Shadowing +----------+--------+--------+--------+------------------+---------+ ICA Mid   227     49                                          +----------+--------+--------+--------+------------------+---------+ ICA Distal81      25                                          +----------+--------+--------+--------+------------------+---------+ ECA       351     9       >50%    calcific                    +----------+--------+--------+--------+------------------+---------+ +----------+--------+-------+--------+-------------------+           PSV cm/sEDV cmsDescribeArm Pressure (mmHG)  +----------+--------+-------+--------+-------------------+ [id number]                                        +----------+--------+-------+--------+-------------------+ +---------+--------+--+--------+--+ VertebralPSV cm/s45EDV cm/s12 +---------+--------+--+--------+--+ Higher velocities may be obscured by severe plaque and acoustic shadowing.  Summary: Right Carotid: Velocities in the right ICA are consistent with a 60-79%                stenosis. Higher velocities may be obscured by severe plaque and                acoustic shadowing. Vertebrals:  Right vertebral artery demonstrates antegrade flow. Subclavians: Normal flow hemodynamics were seen in the right subclavian artery. *See table(s) above for measurements and observations.     Preliminary    MR BRAIN WO CONTRAST  Result Date: 05/16/2022 CLINICAL DATA:  Left-sided weakness. EXAM: MRI HEAD WITHOUT CONTRAST TECHNIQUE: Multiplanar, multiecho pulse sequences of the brain and surrounding structures were obtained without intravenous contrast. COMPARISON:  Brain MRI 09/14/2019 FINDINGS: Brain: Restricted diffusion in the medial right thalamus and low posterior limb of the right internal capsule, reaching the globus pallidus. Chronic small vessel ischemia in the cerebral white matter. Chronic lacunar infarct in the right caudate head. Small chronic infarcts in the bilateral cerebellum. Small chronic blood products in the deep white matter. No acute hemorrhage, hydrocephalus, or masslike finding. Vascular: Major flow voids are preserved Skull and upper cervical spine: Normal marrow signal Sinuses/Orbits: Bilateral cataract resection IMPRESSION: 1. Acute infarct in the right medial temporal lobe and posterior limb of the internal capsule, possibly anterior choroidal. 2. Background of chronic small vessel ischemia with chronic small vessel infarcts. Electronically Signed   By: Tiburcio Pea M.D.   On: 05/16/2022 12:27    PHYSICAL EXAM  Temp:   [97.8 F (36.6 C)-98.9 F (37.2 C)] 98.3 F (36.8 C) (10/07 0754) Pulse Rate:  [63-80] 71 (10/07 0754) Resp:  [13-18] 16 (10/07 0754) BP: (157-228)/(44-79) 188/55 (10/07 0754) SpO2:  [92 %-98 %] 97 % (  10/07 0754)  General - Well nourished, well developed, in no apparent distress. Cardiovascular - Regular rhythm and rate.  Mental Status -  Level of arousal and orientation to time, place, and person were intact. Language including expression, naming, repetition, comprehension was assessed and found intact. Attention span and concentration were normal. Recent and remote memory were intact. Fund of Knowledge was assessed and was intact.  Cranial Nerves II - XII - II - Visual field intact OU. III, IV, VI - Extraocular movements intact. V - Facial sensation intact bilaterally. VII - Facial movement intact bilaterally. VIII - Hearing & vestibular intact bilaterally. X - Palate elevates symmetrically. XI - Chin turning & shoulder shrug intact bilaterally. XII - Tongue protrusion intact.  Motor Strength - The patient's strength was normal in all extremities and pronator drift was absent.  Bulk was normal and fasciculations were absent.   Motor Tone - Muscle tone was assessed at the neck and appendages and was normal.  Sensory - Light touch, temperature/pinprick were assessed and were symmetrical.    Coordination - The patient had normal movements in the hands and feet with no ataxia or dysmetria.  Tremor was absent.  Gait and Station - deferred.   ASSESSMENT/PLAN Ms. Janasha Barkalow is a 81 y.o. female with history of HLD, HTN, CAD, TIA, stroke, bipolar disorder, PAD and bilateral carotid stenosis s/p stenting of left carotid artery who presents with sudden onset left sided weakness, left facial droop, slurred speech, left sided sensory deficit, left sided neglect and left upper quandrantopia.  Patient had just finished a water aerobics class and was waiting on a bench when she  experienced sudden onset of the above symptoms.  EMS was called, they found her to have flaccid left hemiparesis and she was brought to the ED as a code stroke.  Symptoms improved on the way to the hospital but not completely resolved. She received IV TNK.   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 Redemonstrated chronic lacunar infarct within the right caudate head. CTA head & neck Mixed plaque at the right carotid bifurcation resulting in approximately 60% stenosis, degraded by motion artifact. Patent left CCA/ICA stent. unchanged intracranial atherosclerotic disease resulting in multifocal moderate to severe stenoses in the anterior and posterior circulations. Focal severe stenosis in the mid basilar artery which is unchanged. MRI Acute infarct in the right medial temporal lobe and posterior limb of the internal capsule, possibly anterior choroidal. 2D Echo EF 50-55% Carotid Doppler right ICA are consistent with a 60-79% stenosis. Higher velocities may be obscured by severe plaque and acoustic shadowing.   LDL 56 HgbA1c 6.8 VTE prophylaxis - SCD"s aspirin 81 mg daily prior to admission, now on aspirin 325 mg daily and clopidogrel 75 mg daily.  Further regimen per vascular surgery. Therapy recommendations:  outpt PT/OT Disposition:  pending  Carotid stenosis  S/p left CEA and then stenting 02/2019 with Dr. Randie Heinz CTA head & neck Mixed plaque at the right carotid bifurcation resulting in approximately 60% stenosis MRI stroke consistent with R AchA territory Carotid Doppler right ICA are consistent with a 60-79% stenosis. Higher velocities may be obscured by severe plaque and acoustic shadowing. Vascular surgery following, plan for right CEA vs. TCAR next Tuesday  History of stroke/TIA 02/2019 admitted  for transient aphasia.  CT no acute  abnormality.  MRI negative for stroke.  CTA head and neck showed severe intracranial stenosis including right ICA siphon, bilateral V4, basilar artery, right M1 and M2.  Bilateral ICA 50 to 60% stenosis.  Carotid Doppler showed right ICA 40 to 59% stenosis, left ICA 60 to 79% stenosis.  EF 60 to 60%.  LDL 122, A1c 7.2, UDS negative.  Discharged on DAPT and Zocor 20. Had left CEA 02/2019 with Dr. Randie Heinz.  Complicated by vascular reocclusion, status post left CAS 2 weeks later. 09/2019 admitted for confusion on awakening in the setting of hyponatremia and hypertension.  MRI no acute abnormality.  CT head and neck unchanged severe stenosis right M1, right ICA 54% stenosis, severe mid basilar stenosis unchanged.  Hypertension Hypotension on presentation Home meds:  hydralazine, losartan BP 80s on ED presentation Now stable on the high end On Zocor 25->50->100 Orthostatic vital sup 184/54, sit 170/66, stand 164/65, after amb to bathroom 198/58 Avoid low BP  Long-term BP goal 130-150 given severe intracranial and extracranial stenosis  Hyperlipidemia Home meds:  repatha, not resumed in hospital LDL 56, goal < 70  Continue Repatha at discharge  Other Stroke Risk Factors Advanced Age >/= 70  Coronary artery disease status post stenting PAD  Other Active Problems Bipolar disorder -on lamotrigine Leukocytosis, improved WBC 12.0-8.0  Hospital day # 2  Gevena Mart DNP, ACNPC-AG   ATTENDING NOTE: I reviewed above note and agree with the assessment and plan. Pt was seen and examined.   Husband and daughter are at bedside. Pt reclining in bed, AAOx3, still has mild left arm proximal weakness, slight drift and 4/5 bicep and tricep. Still complaining of dizziness and blurry vision. Her BP still on the high end, cozaar has increased to 50, will further increase to 100, avoid abrupt BP drop. Orthostatic vital stable. CUS showed right ICA 60-79% stenosis, discussed  with VVS and plan for right CEA vs. TCAR next Tuesday. PT/OT recommend potential outpt PT/OT but did not feel that she can go home this weekend. Continue ASA plavix. Avoid low BP given severe intra and extracranial stenosis.   For detailed assessment and plan, please refer to above/below as I have made changes wherever appropriate.   Marvel Plan, MD PhD Stroke Neurology 05/17/2022 3:27 PM

## 2022-05-18 DIAGNOSIS — I639 Cerebral infarction, unspecified: Secondary | ICD-10-CM | POA: Diagnosis not present

## 2022-05-18 NOTE — Progress Notes (Addendum)
STROKE TEAM PROGRESS NOTE   INTERVAL HISTORY Husband is at the bedside. Patient is sleeping, awakens easily . Fine motor skills diminished in left hand. Left leg is slightly weaker. No new neurological events overnight. VSS.  Neurological exam is unchanged  Vitals:   05/18/22 0035 05/18/22 0539 05/18/22 0615 05/18/22 0718  BP: (!) 167/40 (!) 192/69 (!) 178/54 (!) 180/57  Pulse:    76  Resp:    18  Temp:  98.2 F (36.8 C)  98.1 F (36.7 C)  TempSrc:  Oral  Oral  SpO2:  97%  96%  Weight:      Height:       CBC:  Recent Labs  Lab 05/15/22 1227 05/15/22 1244 05/17/22 0342  WBC 12.0*  --  8.1  NEUTROABS 9.0*  --   --   HGB 13.2 12.6 12.2  HCT 41.0 37.0 37.5  MCV 86.3  --  84.5  PLT 242  --  226    Basic Metabolic Panel:  Recent Labs  Lab 05/15/22 1227 05/15/22 1244 05/17/22 0342  NA 139 140 139  K 3.9 3.7 3.7  CL 109 104 109  CO2 22  --  22  GLUCOSE 132* 135* 149*  BUN 10 10 12   CREATININE 0.79 0.60 0.56  CALCIUM 8.8*  --  8.9    Lipid Panel:  Recent Labs  Lab 05/16/22 0326  CHOL 150  TRIG 216*  HDL 51  CHOLHDL 2.9  VLDL 43*  LDLCALC 56    HgbA1c:  Recent Labs  Lab 05/15/22 1227  HGBA1C 6.8*    Urine Drug Screen:  Recent Labs  Lab 05/15/22 1224  LABOPIA NONE DETECTED  COCAINSCRNUR NONE DETECTED  LABBENZ NONE DETECTED  AMPHETMU NONE DETECTED  THCU NONE DETECTED  LABBARB NONE DETECTED     Alcohol Level  Recent Labs  Lab 05/15/22 1246  ETH <10     IMAGING past 24 hours VAS 07/15/22 CAROTID  Result Date: 05/17/2022 Carotid Arterial Duplex Study Patient Name:  Margaret Butler  Date of Exam:   05/17/2022 Medical Rec #: [id number]        Accession #:    [id number] Date of Birth: Apr 05, 1941        Patient Gender: F Patient Age:   81 years Exam Location:  [facility] Procedure:      VAS [facility] CAROTID Referring Phys: [place] ROBINS --------------------------------------------------------------------------------  Indications:       CVA,  preoperative right TCAR. Left endarterectomy 02/15/19.                    Left TCAR 07/20/19. Risk Factors:      Hypertension, hyperlipidemia, coronary artery disease, PAD. Comparison Study:  Prior Carotid duplex indicating 40-59% right ICA stenosis                    done 06/06/19 Performing Technologist: 06/08/19 RVS  Examination Guidelines: A complete evaluation includes B-mode imaging, spectral Doppler, color Doppler, and power Doppler as needed of all accessible portions of each vessel. Bilateral testing is considered an integral part of a complete examination. Limited examinations for reoccurring indications may be performed as noted.  Right Carotid Findings: +----------+--------+--------+--------+------------------+---------+           PSV cm/sEDV cm/sStenosisPlaque DescriptionComments  +----------+--------+--------+--------+------------------+---------+ CCA Prox  61      11                                          +----------+--------+--------+--------+------------------+---------+  CCA Distal79      11                                          +----------+--------+--------+--------+------------------+---------+ ICA Prox  215     62      60-79%  calcific          Shadowing +----------+--------+--------+--------+------------------+---------+ ICA Mid   227     49                                          +----------+--------+--------+--------+------------------+---------+ ICA Distal81      25                                          +----------+--------+--------+--------+------------------+---------+ ECA       351     9       >50%    calcific                    +----------+--------+--------+--------+------------------+---------+ +----------+--------+-------+--------+-------------------+           PSV cm/sEDV cmsDescribeArm Pressure (mmHG) +----------+--------+-------+--------+-------------------+ [id number]                                         +----------+--------+-------+--------+-------------------+ +---------+--------+--+--------+--+ VertebralPSV cm/s45EDV cm/s12 +---------+--------+--+--------+--+ Higher velocities may be obscured by severe plaque and acoustic shadowing.  Summary: Right Carotid: Velocities in the right ICA are consistent with a 60-79%                stenosis. Higher velocities may be obscured by severe plaque and                acoustic shadowing. Vertebrals:  Right vertebral artery demonstrates antegrade flow. Subclavians: Normal flow hemodynamics were seen in the right subclavian artery. *See table(s) above for measurements and observations.  Electronically signed by Gerarda Fraction on 05/17/2022 at 11:23:21 AM.    Final     PHYSICAL EXAM  Temp:  [97.6 F (36.4 C)-98.2 F (36.8 C)] 98.1 F (36.7 C) (10/08 0718) Pulse Rate:  [69-76] 76 (10/08 0718) Resp:  [16-18] 18 (10/08 0718) BP: (167-213)/(40-77) 180/57 (10/08 0718) SpO2:  [96 %-98 %] 96 % (10/08 0718)  General - Well nourished, well developed pleasant elderly lady, in no apparent distress. Cardiovascular - Regular rhythm and rate.  Mental Status -  Level of arousal and orientation to time, place, and person were intact. Language including expression, naming, repetition, comprehension was assessed and found intact. Attention span and concentration were normal. Recent and remote memory were intact. Fund of Knowledge was assessed and was intact.  Cranial Nerves II - XII - II - Visual field intact OU. III, IV, VI - Extraocular movements intact. V - Facial sensation intact bilaterally. VII - Facial movement intact bilaterally. VIII - Hearing & vestibular intact bilaterally. X - Palate elevates symmetrically. XI - Chin turning & shoulder shrug intact bilaterally. XII - Tongue protrusion intact.  Motor Strength - left arm and leg slightly weaker 4/5  with decreased fine motor skills in left hand. Bulk was normal and fasciculations were absent.  Motor Tone - Muscle tone was assessed at the neck and appendages and was normal.  Sensory - Light touch, temperature/pinprick were assessed and were symmetrical.    Coordination - The patient had normal movements in the hands and feet with no ataxia or dysmetria.  Tremor was absent.  Gait and Station - deferred.   ASSESSMENT/PLAN Margaret Butler is a 81 y.o. female with history of HLD, HTN, CAD, TIA, stroke, bipolar disorder, PAD and bilateral carotid stenosis s/p stenting of left carotid artery who presents with sudden onset left sided weakness, left facial droop, slurred speech, left sided sensory deficit, left sided neglect and left upper quandrantopia.  Patient had just finished a water aerobics class and was waiting on a bench when she experienced sudden onset of the above symptoms.  EMS was called, they found her to have flaccid left hemiparesis and she was brought to the ED as a code stroke.  Symptoms improved on the way to the hospital but not completely resolved. She received IV TNK.   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 Redemonstrated chronic lacunar infarct within the right caudate head. CTA head & neck Mixed plaque at the right carotid bifurcation resulting in approximately 60% stenosis, degraded by motion artifact. Patent left CCA/ICA stent. unchanged intracranial atherosclerotic disease resulting in multifocal moderate to severe stenoses in the anterior and posterior circulations. Focal severe stenosis in the mid basilar artery which is unchanged. MRI Acute infarct in the right medial temporal lobe and posterior limb of the internal capsule, possibly anterior choroidal. 2D Echo EF 50-55% Carotid Doppler right ICA are consistent with a 60-79% stenosis. Higher velocities may be  obscured by severe plaque and acoustic shadowing.   LDL 56 HgbA1c 6.8 VTE prophylaxis - SCD"s aspirin 81 mg daily prior to admission, now on aspirin 325 mg daily and clopidogrel 75 mg daily.  Further regimen per vascular surgery. Therapy recommendations:  outpt PT/OT Disposition:  pending  Carotid stenosis  S/p left CEA and then stenting 02/2019 with Dr. Donzetta Matters CTA head & neck Mixed plaque at the right carotid bifurcation resulting in approximately 60% stenosis MRI stroke consistent with R AchA territory Carotid Doppler right ICA are consistent with a 60-79% stenosis. Higher velocities may be obscured by severe plaque and acoustic shadowing. Vascular surgery following, plan for right CEA vs. TCAR next Tuesday  History of stroke/TIA 02/2019 admitted for transient aphasia.  CT no acute abnormality.  MRI negative for stroke.  CTA head and neck showed severe intracranial stenosis including right ICA siphon, bilateral V4, basilar artery, right M1 and M2.  Bilateral ICA 50 to 60% stenosis.  Carotid Doppler showed right ICA 40 to 59% stenosis, left ICA 60 to 79% stenosis.  EF 60 to 60%.  LDL 122, A1c 7.2, UDS negative.  Discharged on DAPT and Zocor 20. Had left CEA 02/2019 with Dr. Donzetta Matters.  Complicated by vascular reocclusion, status post left CAS 2 weeks later. 09/2019 admitted for confusion on awakening in the setting of hyponatremia and hypertension.  MRI no acute abnormality.  CT head and neck unchanged severe stenosis right M1, right ICA 54% stenosis, severe mid basilar stenosis unchanged.  Hypertension Hypotension on presentation Home meds:  hydralazine, losartan BP 80s on ED presentation Now stable on the high end On Zocor 25->50->100 Orthostatic vital sup 184/54,  sit 170/66, stand 164/65, after amb to bathroom 198/58 Avoid low BP  Long-term BP goal 130-150 given severe intracranial and extracranial stenosis  Hyperlipidemia Home meds:  repatha, not resumed in hospital LDL 56, goal < 70   Continue Repatha at discharge  Other Stroke Risk Factors Advanced Age >/= 45  Coronary artery disease status post stenting PAD  Other Active Problems Bipolar disorder -on lamotrigine Leukocytosis, improved WBC 12.0-8.0  Hospital day # 3  Gevena Mart DNP, ACNPC-AG    I have personally obtained history,examined this patient, reviewed notes, independently viewed imaging studies, participated in medical decision making and plan of care.ROS completed by me personally and pertinent positives fully documented  I have made any additions or clarifications directly to the above note. Agree with note above.  Patient appears neurologically stable.  She has moderate proximal right ICA stenosis which is likely symptomatic given her MRI infarct distribution which appears to be an anterior choroidal artery territory.  She also has right M1 high-grade stenosis which appears unchanged from previous study.  Agree with plan for revascularization of proximal right carotid early next week and vascular surgery.  Continue aspirin and Plavix and aggressive risk factor modification.  Long discussion with patient and husband and answered questions.  Greater than 50% time during this 35-minute visit was spent in counseling and coordination of care and discussion patient and care team and answering questions.  Delia Heady, MD Medical Director [facility] Pager: [phone] 05/18/2022 3:17 PM

## 2022-05-19 DIAGNOSIS — I639 Cerebral infarction, unspecified: Secondary | ICD-10-CM | POA: Diagnosis not present

## 2022-05-19 DIAGNOSIS — I6521 Occlusion and stenosis of right carotid artery: Secondary | ICD-10-CM | POA: Diagnosis not present

## 2022-05-19 NOTE — Progress Notes (Signed)
Occupational Therapy Treatment Patient Details Name: Margaret Butler MRN: [id number] DOB: 02-12-1941 Today's Date: 05/19/2022   History of present illness 81 yo female presented to ED code stroke with significant L sided weakenss, L facial droop slurred speech L sensory deficits and L upper quandrantopia. Imaging (+) acute R temporal lobe and internal capsule infarcts. PMHx significant for HLD, HTN, CAD, Hx of TIA, Hx of CVA, bipolar disorder and PAD.   OT comments  Pt received in recliner with husband present. Initially planned to review [facility] handout however pt with more concern about shoulder ROM. 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 Pt able to follow cuing and demo for technique. Written handout provided and reviewed with pt and husband. No further questions. Pt declined toileting at this time and remains in recliner at end of session with exit alarm on and call light in reach   Recommendations for follow up therapy are one component of a multi-disciplinary discharge planning process, led by the attending physician.  Recommendations may be updated based on patient status, additional functional criteria and insurance authorization.    Follow Up Recommendations  Outpatient OT    Assistance Recommended at Discharge Frequent or constant Supervision/Assistance  Patient can return home with the following  A little help with walking and/or transfers;A little help with bathing/dressing/bathroom;Assistance with cooking/housework;Assist for transportation   Equipment Recommendations  None recommended by OT    Recommendations for Other Services      Precautions / Restrictions Precautions Precautions: Fall Restrictions Weight Bearing Restrictions: No         Extremity/Trunk Assessment Upper Extremity Assessment LUE Deficits / Details: 3+/5 LUE at shoulder and  digits--arthritic shulder decreased ROM >90*. 4/5 at elbow. LUE Sensation: WNL LUE Coordination: decreased fine motor       Cervical / Trunk Assessment Cervical / Trunk Assessment: Normal     Cognition Arousal/Alertness: Awake/alert Behavior During Therapy: WFL for tasks assessed/performed Overall Cognitive Status: Within Functional Limits for tasks assessed         Exercises Shoulder Exercises Shoulder Flexion: AROM, 10 reps, Left Shoulder ABduction: AROM, 10 reps, Left Shoulder External Rotation: AROM, 10 reps, Left Elbow Flexion: AROM, 10 reps, Left Elbow Extension: AROM, 10 reps, Left            Pertinent Vitals/ Pain       Pain Assessment Pain Assessment: No/denies pain   Frequency  Min 2X/week        Progress Toward Goals  OT Goals(current goals can now be found in the care plan section)         AM-PAC OT "6 Clicks" Daily Activity     Outcome Measure   Help from another person eating meals?: A Little Help from another person taking care of personal grooming?: A Little Help from another person toileting, which includes using toliet, bedpan, or urinal?: A Little Help from another person bathing (including washing, rinsing, drying)?: A Little Help from another person to put on and taking off regular upper body clothing?: A Little Help from another person to put on and taking off regular lower body clothing?: A Little 6 Click Score: 18    End of Session    OT Visit Diagnosis: Unsteadiness on feet (R26.81);Repeated falls (R29.6);Muscle weakness (generalized) (M62.81);Hemiplegia and hemiparesis Hemiplegia - Right/Left: Left Hemiplegia - dominant/non-dominant: Non-Dominant Hemiplegia - caused by: Cerebral infarction   Activity Tolerance Patient tolerated treatment well  Patient Left in chair;with call bell/phone within reach;with family/visitor present           Time: 1030-1314 OT Time Calculation (min): 18 min  Charges: OT General  Charges $OT Visit: 1 Visit OT Treatments $Therapeutic Exercise: 8-22 mins  [facility] MOTR/L   Shon Hale 05/19/2022, 2:00 PM

## 2022-05-19 NOTE — Progress Notes (Signed)
Physical Therapy Treatment Patient Details Name: Margaret Butler MRN: [id number] DOB: August 08, 1941 Today's Date: 05/19/2022   History of Present Illness 81 yo female presented to ED code stroke with significant L sided weakenss, L facial droop slurred speech L sensory deficits and L upper quandrantopia. Imaging (+) acute R temporal lobe and internal capsule infarcts. PMHx significant for HLD, HTN, CAD, Hx of TIA, Hx of CVA, bipolar disorder and PAD.    PT Comments    Pt progressing towards physical therapy goals. Was able to perform transfers and ambulation with gross min guard assist to supervision for safety and RW for support. Pt mobilizing well within room and into the bathroom. Recommend pt continue with toilet transfers and discontinue Purewick. Pt scored 16/24 on the DGI, indicating she is at a higher risk of falls. Continue to feel pt will benefit from outpatient PT services at d/c to maximize functional independence and safety. Will continue to follow.    Recommendations for follow up therapy are one component of a multi-disciplinary discharge planning process, led by the attending physician.  Recommendations may be updated based on patient status, additional functional criteria and insurance authorization.  Follow Up Recommendations  Outpatient PT     Assistance Recommended at Discharge Frequent or constant Supervision/Assistance  Patient can return home with the following A little help with walking and/or transfers;Assistance with cooking/housework;Assist for transportation;A little help with bathing/dressing/bathroom;Help with stairs or ramp for entrance   Equipment Recommendations  None recommended by PT    Recommendations for Other Services       Precautions / Restrictions Precautions Precautions: Fall Restrictions Weight Bearing Restrictions: No     Mobility  Bed Mobility               General bed mobility comments: Sitting EOB when PT arrived     Transfers Overall transfer level: Needs assistance Equipment used: Rolling walker (2 wheels) Transfers: Sit to/from Stand Sit to Stand: Supervision           General transfer comment: Close supervision provided as pt powered up to full stand.    Ambulation/Gait Ambulation/Gait assistance: Min guard Gait Distance (Feet): 300 Feet Assistive device: Rolling walker (2 wheels) Gait Pattern/deviations: Step-through pattern, Decreased stride length, Drifts right/left Gait velocity: decreased Gait velocity interpretation: 1.31 - 2.62 ft/sec, indicative of limited community ambulator   General Gait Details: Slow but generally steady with RW for support. No overt LOB noted.   Stairs             Wheelchair Mobility    Modified Rankin (Stroke Patients Only) Modified Rankin (Stroke Patients Only) Pre-Morbid Rankin Score: No significant disability Modified Rankin: Moderately severe disability     Balance Overall balance assessment: Needs assistance Sitting-balance support: Feet supported, No upper extremity supported Sitting balance-Leahy Scale: Fair     Standing balance support: Bilateral upper extremity supported, During functional activity, Reliant on assistive device for balance Standing balance-Leahy Scale: Poor Standing balance comment: Reliant on BUE support                 Standardized Balance Assessment Standardized Balance Assessment : Dynamic Gait Index   Dynamic Gait Index Level Surface: Mild Impairment Change in Gait Speed: Mild Impairment Gait with Horizontal Head Turns: Mild Impairment Gait with Vertical Head Turns: Mild Impairment Gait and Pivot Turn: Mild Impairment Step Over Obstacle: Mild Impairment Step Around Obstacles: Mild Impairment Steps: Mild Impairment Total Score: 16      Cognition Arousal/Alertness: Awake/alert Behavior During Therapy: [facility]  for tasks assessed/performed Overall Cognitive Status: Within Functional Limits for  tasks assessed                                          Exercises      General Comments        Pertinent Vitals/Pain Pain Assessment Pain Assessment: No/denies pain    Home Living Family/patient expects to be discharged to:: Private residence Living Arrangements: Spouse/significant other Available Help at Discharge: Family;Available 24 hours/day Type of Home: House Home Access: Stairs to enter Entrance Stairs-Rails: Right;Left;Can reach both Entrance Stairs-Number of Steps: 4   Home Layout: Multi-level;Able to live on main level with bedroom/bathroom Home Equipment: Shower Counsellor (2 wheels);Grab bars - tub/shower      Prior Function            PT Goals (current goals can now be found in the care plan section) Acute Rehab PT Goals Patient Stated Goal: home after vascular procedure PT Goal Formulation: With patient/family Time For Goal Achievement: 05/31/22 Potential to Achieve Goals: Good Progress towards PT goals: Progressing toward goals    Frequency    Min 2X/week      PT Plan Frequency needs to be updated    Co-evaluation              AM-PAC PT "6 Clicks" Mobility   Outcome Measure  Help needed turning from your back to your side while in a flat bed without using bedrails?: A Little Help needed moving from lying on your back to sitting on the side of a flat bed without using bedrails?: A Little Help needed moving to and from a bed to a chair (including a wheelchair)?: A Little Help needed standing up from a chair using your arms (e.g., wheelchair or bedside chair)?: A Little Help needed to walk in hospital room?: A Little Help needed climbing 3-5 steps with a railing? : A Lot 6 Click Score: 17    End of Session Equipment Utilized During Treatment: Gait belt Activity Tolerance: Patient tolerated treatment well Patient left: in chair;with call bell/phone within reach;with family/visitor present;with chair alarm  set Nurse Communication: Mobility status PT Visit Diagnosis: Unsteadiness on feet (R26.81);History of falling (Z91.81);Difficulty in walking, not elsewhere classified (R26.2)     Time: 1140-1155 PT Time Calculation (min) (ACUTE ONLY): 15 min  Charges:  $Physical Performance Test: 8-22 mins                     Conni Slipper, PT, DPT Acute Rehabilitation Services Secure Chat Preferred Office: [phone]    Marylynn Pearson 05/19/2022, 2:29 PM

## 2022-05-19 NOTE — Progress Notes (Signed)
STROKE TEAM PROGRESS NOTE   INTERVAL HISTORY Husband is at the bedside. Patient is lying comfortably in bed. Fine motor skills diminished in left hand. Left leg is slightly weaker. No new neurological events overnight. VSS.  Neurological exam is unchanged.  TCAR procedure planned on right carotid by Dr. Donzetta Butler tomorrow  Vitals:   05/18/22 2210 05/19/22 0007 05/19/22 0508 05/19/22 0744  BP: (!) 164/53 (!) 167/56 (!) 165/137 (!) 194/70  Pulse: 64 69 79 75  Resp:   18 14  Temp:  98.3 F (36.8 C) 97.9 F (36.6 C) 97.9 F (36.6 C)  TempSrc:  Oral Oral Oral  SpO2:  97% 99% 99%  Weight:      Height:       CBC:  Recent Labs  Lab 05/15/22 1227 05/15/22 1244 05/17/22 0342  WBC 12.0*  --  8.1  NEUTROABS 9.0*  --   --   HGB 13.2 12.6 12.2  HCT 41.0 37.0 37.5  MCV 86.3  --  84.5  PLT 242  --  630   Basic Metabolic Panel:  Recent Labs  Lab 05/15/22 1227 05/15/22 1244 05/17/22 0342  NA 139 140 139  K 3.9 3.7 3.7  CL 109 104 109  CO2 22  --  22  GLUCOSE 132* 135* 149*  BUN 10 10 12   CREATININE 0.79 0.60 0.56  CALCIUM 8.8*  --  8.9   Lipid Panel:  Recent Labs  Lab 05/16/22 0326  CHOL 150  TRIG 216*  HDL 51  CHOLHDL 2.9  VLDL 43*  LDLCALC 56   HgbA1c:  Recent Labs  Lab 05/15/22 1227  HGBA1C 6.8*   Urine Drug Screen:  Recent Labs  Lab 05/15/22 1224  LABOPIA NONE DETECTED  COCAINSCRNUR NONE DETECTED  LABBENZ NONE DETECTED  AMPHETMU NONE DETECTED  THCU NONE DETECTED  LABBARB NONE DETECTED    Alcohol Level  Recent Labs  Lab 05/15/22 1246  ETH <10    IMAGING past 24 hours No results found.  PHYSICAL EXAM  Temp:  [97.9 F (36.6 C)-98.9 F (37.2 C)] 97.9 F (36.6 C) (10/09 0744) Pulse Rate:  [64-84] 75 (10/09 0744) Resp:  [14-20] 14 (10/09 0744) BP: (164-201)/(53-137) 194/70 (10/09 0744) SpO2:  [97 %-100 %] 99 % (10/09 0744)  General - Well nourished, well developed pleasant elderly lady, in no apparent distress. Cardiovascular - Regular rhythm  and rate.  Mental Status -  Level of arousal and orientation to time, place, and person were intact. Language including expression, naming, repetition, comprehension was assessed and found intact. Attention span and concentration were normal. Recent and remote memory were intact. Fund of Knowledge was assessed and was intact.  Cranial Nerves II - XII - II - Visual field intact OU. III, IV, VI - Extraocular movements intact. V - Facial sensation intact bilaterally. VII - Facial movement intact bilaterally. VIII - Hearing & vestibular intact bilaterally. X - Palate elevates symmetrically. XI - Chin turning & shoulder shrug intact bilaterally. XII - Tongue protrusion intact.  Motor Strength - left arm and leg slightly weaker 4/5  with decreased fine motor skills in left hand. Bulk was normal and fasciculations were absent.   Motor Tone - Muscle tone was assessed at the neck and appendages and was normal.  Sensory - Light touch, temperature/pinprick were assessed and were symmetrical.    Coordination - The patient had normal movements in the hands and feet with no ataxia or dysmetria.  Tremor was absent.  Gait and Station -  deferred.   ASSESSMENT/PLAN [name] is a 81 y.o. female with history of HLD, HTN, CAD, TIA, stroke, bipolar disorder, PAD and bilateral carotid stenosis s/p stenting of left carotid artery who presents with sudden onset left sided weakness, left facial droop, slurred speech, left sided sensory deficit, left sided neglect and left upper quandrantopia.  Patient had just finished a water aerobics class and was waiting on a bench when she experienced sudden onset of the above symptoms.  EMS was called, they found her to have flaccid left hemiparesis and she was brought to the ED as a code stroke.  Symptoms improved on the way to the hospital but not completely resolved. She received IV TNK.   Stroke: R AchA territory infarct including optic radiation and PLIC  s/p IV TNK, likely due to right carotid stenosis   Code Stroke CT head 12 mm focus of abnormal hypodensity within the right internal capsule/inferior right basal ganglia. Redemonstrated chronic lacunar infarct within the right caudate head. CTA head & neck Mixed plaque at the right carotid bifurcation resulting in approximately 60% stenosis, degraded by motion artifact. Patent left CCA/ICA stent. unchanged intracranial atherosclerotic disease resulting in multifocal moderate to severe stenoses in the anterior and posterior circulations. Focal severe stenosis in the mid basilar artery which is unchanged. MRI Acute infarct in the right medial temporal lobe and posterior limb of the internal capsule, possibly anterior choroidal. 2D Echo EF 50-55% Carotid Doppler right ICA are consistent with a 60-79% stenosis. Higher velocities may be obscured by severe plaque and acoustic shadowing.   LDL 56 HgbA1c 6.8 VTE prophylaxis - SCD"s aspirin 81 mg daily prior to admission, now on aspirin 325 mg daily and clopidogrel 75 mg daily.  Further regimen per vascular surgery. Therapy recommendations:  Outpt PT/OT Disposition:  pending  Carotid stenosis  S/p left CEA and then stenting 02/2019 with Dr. Randie Butler CTA head & neck Mixed plaque at the right carotid bifurcation resulting in approximately 60% stenosis MRI stroke consistent with R AchA territory Carotid Doppler right ICA are consistent with a 60-79% stenosis. Higher velocities may be obscured by severe plaque and acoustic shadowing. Vascular surgery following, plan for right CEA vs. TCAR next Tuesday  History of stroke/TIA 02/2019 admitted for transient aphasia.  CT no acute abnormality.  MRI negative for stroke.  CTA head and neck showed severe intracranial stenosis including right ICA siphon, bilateral V4, basilar artery, right M1 and M2.  Bilateral ICA 50 to 60% stenosis.  Carotid Doppler showed right ICA 40 to 59% stenosis, left ICA 60 to 79% stenosis.  EF 60  to 60%.  LDL 122, A1c 7.2, UDS negative.  Discharged on DAPT and Zocor 20. Had left CEA 02/2019 with Dr. Randie Butler.  Complicated by vascular reocclusion, status post left CAS 2 weeks later. 09/2019 admitted for confusion on awakening in the setting of hyponatremia and hypertension.  MRI no acute abnormality.  CT head and neck unchanged severe stenosis right M1, right ICA 54% stenosis, severe mid basilar stenosis unchanged.  Hypertension - Hypotension on presentation Home meds:  hydralazine, losartan BP 80s on ED presentation Now stable on the high end On Zocor 25->50->100 Orthostatic vital sup 184/54, sit 170/66, stand 164/65, after amb to bathroom 198/58 Avoid low BP - BP on 10/9 as high as 194/70 - Pt is on Losartan scheduled with hydralazine and labetalol prn. May need further Rx after carotid intervention planned for Tuesday (tomorrow) Long-term BP goal 130-150 given severe intracranial and extracranial stenosis  Hyperlipidemia Home meds:  repatha, not resumed in hospital LDL 56, goal < 70  Continue Repatha at discharge  Other Stroke Risk Factors Advanced Age >/= 39  Coronary artery disease status post stenting PAD  Other Active Problems Bipolar disorder -on lamotrigine Leukocytosis, improved WBC 12.0-8.0 Elevated glucose levels 130's - 140's. HgbA1c 6.8. (apparently pt has Hx of DM - currently not on medication for DM)  Hospital day # 4  Patient appears neurologically stable.  She has moderate proximal right ICA stenosis which is likely symptomatic given her MRI infarct distribution which appears to be an anterior choroidal artery territory.  She also has right M1 high-grade stenosis which appears unchanged from previous study.  Agree with plan for revascularization of proximal right carotid early tomorrow by vascular surgery.  Continue aspirin and Plavix and aggressive risk factor modification.  Long discussion with patient and husband and answered questions.  Discussed with Dr. Randie Butler.   Greater than 50% time during this 35-minute visit was spent in counseling and coordination of care and discussion patient and care team and answering questions.  Delia Heady, MD

## 2022-05-19 NOTE — Care Management Important Message (Signed)
Important Message  Patient Details  Name: Margaret Butler MRN: [id number] Date of Birth: [birth year]   Medicare Important Message Given:  Yes     Kariya Lavergne Montine Circle 05/19/2022, 3:46 PM

## 2022-05-19 NOTE — Progress Notes (Signed)
  Progress Note    05/19/2022 3:49 PM * No surgery date entered *  Subjective: No overnight events, feeling much better  Vitals:   05/19/22 1312 05/19/22 1538  BP: (!) 177/60 (!) 166/57  Pulse: 65 63  Resp: 16 18  Temp: 97.9 F (36.6 C) 98.2 F (36.8 C)  SpO2: 95% 98%    Physical Exam: Awake alert and oriented Nonlabored secretions Moving all extremities without limitation  CBC    Component Value Date/Time   WBC 8.1 05/17/2022 0342   RBC 4.44 05/17/2022 0342   HGB 12.2 05/17/2022 0342   HCT 37.5 05/17/2022 0342   PLT 226 05/17/2022 0342   MCV 84.5 05/17/2022 0342   MCH 27.5 05/17/2022 0342   MCHC 32.5 05/17/2022 0342   RDW 13.7 05/17/2022 0342   LYMPHSABS 1.9 05/15/2022 1227   MONOABS 0.9 05/15/2022 1227   EOSABS 0.1 05/15/2022 1227   BASOSABS 0.1 05/15/2022 1227    BMET    Component Value Date/Time   NA 139 05/17/2022 0342   K 3.7 05/17/2022 0342   CL 109 05/17/2022 0342   CO2 22 05/17/2022 0342   GLUCOSE 149 (H) 05/17/2022 0342   BUN 12 05/17/2022 0342   CREATININE 0.56 05/17/2022 0342   CALCIUM 8.9 05/17/2022 0342   GFRNONAA >60 05/17/2022 0342   GFRAA >60 09/16/2019 0936    INR    Component Value Date/Time   INR 1.0 05/15/2022 1227     Intake/Output Summary (Last 24 hours) at 05/19/2022 1549 Last data filed at 05/19/2022 0800 Gross per 24 hour  Intake 360 ml  Output 900 ml  Net -540 ml     Assessment:  81 y.o. female is s/p right hemispheric stroke secondary to carotid stenosis.  Plan: Right transcarotid artery stenting tomorrow Patient is on aspirin, Plavix and statin.   Margaret Butler Matters, MD [facility] Office: [phone] Pager: [phone]  05/19/2022 3:49 PM

## 2022-05-19 NOTE — TOC Initial Note (Signed)
Transition of Care [facility]) - Initial/Assessment Note    Patient Details  Name: Margaret Butler MRN: [id number] Date of Birth: 05-10-1941  Transition of Care [facility]) CM/SW Contact:    Kermit Balo, RN Phone Number: 05/19/2022, 10:54 AM  Clinical Narrative:                 Pt is from home with her spouse, who is with her most of the time. Pt also has 2 daughters that are coming into town to assist at home.  Pts spouse is able to provide needed transportation.  Pt manages her own medications but states her spouse or daughters can over see them once discharged home.  Plan is for carotid stent tomorrow.  Recommendations are for outpatient PT/OT. Pt prefers to attend at [facility] outpatient. CM will fax the outpatient the needed orders.  TOC following.  Expected Discharge Plan: OP Rehab Barriers to Discharge: Continued Medical Work up   Patient Goals and CMS Choice   CMS Medicare.gov Compare Post Acute Care list provided to:: Patient Choice offered to / list presented to : Patient  Expected Discharge Plan and Services Expected Discharge Plan: OP Rehab   Discharge Planning Services: CM Consult   Living arrangements for the past 2 months: Single Family Home                                      Prior Living Arrangements/Services Living arrangements for the past 2 months: Single Family Home Lives with:: Spouse Patient language and need for interpreter reviewed:: Yes Do you feel safe going back to the place where you live?: Yes      Need for Family Participation in Patient Care: Yes (Comment) Care giver support system in place?: Yes (comment) Current home services: DME (BSC/ shower seat/ walker/ cane) Criminal Activity/Legal Involvement Pertinent to Current Situation/Hospitalization: No - Comment as needed  Activities of Daily Living Home Assistive Devices/Equipment: Walker (specify type) ADL Screening (condition at time of admission) Patient's cognitive  ability adequate to safely complete daily activities?: Yes Is the patient deaf or have difficulty hearing?: No Does the patient have difficulty seeing, even when wearing glasses/contacts?: No Does the patient have difficulty concentrating, remembering, or making decisions?: No Patient able to express need for assistance with ADLs?: Yes Does the patient have difficulty dressing or bathing?: No Independently performs ADLs?: Yes (appropriate for developmental age) Does the patient have difficulty walking or climbing stairs?: No Weakness of Legs: None Weakness of Arms/Hands: None  Permission Sought/Granted                  Emotional Assessment Appearance:: Appears stated age Attitude/Demeanor/Rapport: Engaged Affect (typically observed): Accepting Orientation: : Oriented to Self, Oriented to Place, Oriented to  Time, Oriented to Situation   Psych Involvement: No (comment)  Admission diagnosis:  Acute ischemic stroke [facility]) [I63.9] Patient Active Problem List   Diagnosis Date Noted   Acute ischemic stroke (HCC) 05/15/2022   Hypokalemia 09/15/2019   Acute metabolic encephalopathy 09/14/2019   Hyponatremia 09/14/2019   Carotid artery stenosis 07/20/2019   TIA (transient ischemic attack) 02/16/2019   Left carotid stenosis 02/16/2019   Benign essential HTN 02/16/2019   DM type 2 (diabetes mellitus, type 2) (HCC) 02/16/2019   Expressive aphasia 02/12/2019   Hypertension    Hypertensive urgency    Elevated transaminase level    Hyperlipidemia    PCP:  Luiz Iron,  Valaria Good., MD Pharmacy:   [id number] [place] [address] Phone: [phone] Fax: [phone]     Social Determinants of Health (SDOH) Interventions    Readmission Risk Interventions     No data to display

## 2022-05-20 ENCOUNTER — Encounter (HOSPITAL_COMMUNITY): Payer: Self-pay | Admitting: Student in an Organized Health Care Education/Training Program

## 2022-05-20 ENCOUNTER — Inpatient Hospital Stay (HOSPITAL_COMMUNITY): Payer: Medicare PPO

## 2022-05-20 ENCOUNTER — Inpatient Hospital Stay (HOSPITAL_COMMUNITY): Payer: Medicare PPO | Admitting: Anesthesiology

## 2022-05-20 ENCOUNTER — Encounter (HOSPITAL_COMMUNITY)
Admission: EM | Disposition: A | Discharge status: Home / Self Care | Payer: Self-pay | Source: Home / Self Care | Attending: Neurology

## 2022-05-20 DIAGNOSIS — I10 Essential (primary) hypertension: Secondary | ICD-10-CM

## 2022-05-20 DIAGNOSIS — I2510 Atherosclerotic heart disease of native coronary artery without angina pectoris: Secondary | ICD-10-CM | POA: Diagnosis not present

## 2022-05-20 DIAGNOSIS — E1151 Type 2 diabetes mellitus with diabetic peripheral angiopathy without gangrene: Secondary | ICD-10-CM | POA: Diagnosis not present

## 2022-05-20 DIAGNOSIS — I6521 Occlusion and stenosis of right carotid artery: Secondary | ICD-10-CM | POA: Diagnosis not present

## 2022-05-20 HISTORY — PX: TRANSCAROTID ARTERY REVASCULARIZATIONÂ: SHX6778

## 2022-05-20 HISTORY — PX: ULTRASOUND GUIDANCE FOR VASCULAR ACCESS: SHX6516

## 2022-05-20 LAB — TYPE AND SCREEN
ABO/RH(D): O POS
Antibody Screen: NEGATIVE

## 2022-05-20 LAB — POCT ACTIVATED CLOTTING TIME: Activated Clotting Time: 281 seconds

## 2022-05-20 LAB — SARS CORONAVIRUS 2 BY RT PCR: SARS Coronavirus 2 by RT PCR: NEGATIVE

## 2022-05-20 LAB — GLUCOSE, CAPILLARY: Glucose-Capillary: 137 mg/dL — ABNORMAL HIGH (ref 70–99)

## 2022-05-20 SURGERY — TRANSCAROTID ARTERY REVASCULARIZATION (TCAR)
Anesthesia: General | Site: Neck | Laterality: Right

## 2022-05-20 MED ORDER — METOPROLOL TARTRATE 5 MG/5ML IV SOLN: 2.0000 mg | INTRAVENOUS | Status: DC | PRN

## 2022-05-20 MED ORDER — ROCURONIUM BROMIDE 10 MG/ML (PF) SYRINGE
PREFILLED_SYRINGE | INTRAVENOUS | Status: AC
  Filled 2022-05-20: qty 10

## 2022-05-20 MED ORDER — ALUM & MAG HYDROXIDE-SIMETH 200-200-20 MG/5ML PO SUSP: 15.0000 mL | ORAL | Status: DC | PRN

## 2022-05-20 MED ORDER — EPHEDRINE SULFATE-NACL 50-0.9 MG/10ML-% IV SOSY
PREFILLED_SYRINGE | INTRAVENOUS | Status: DC | PRN
  Administered 2022-05-20 (×2): 10 mg via INTRAVENOUS

## 2022-05-20 MED ORDER — ONDANSETRON HCL 4 MG/2ML IJ SOLN
INTRAMUSCULAR | Status: DC | PRN
  Administered 2022-05-20: 4 mg via INTRAVENOUS

## 2022-05-20 MED ORDER — PHENOL 1.4 % MT LIQD: 1.0000 | OROMUCOSAL | Status: DC | PRN

## 2022-05-20 MED ORDER — SUGAMMADEX SODIUM 200 MG/2ML IV SOLN
INTRAVENOUS | Status: DC | PRN
  Administered 2022-05-20: 200 mg via INTRAVENOUS

## 2022-05-20 MED ORDER — CEFAZOLIN SODIUM-DEXTROSE 2-4 GM/100ML-% IV SOLN
INTRAVENOUS | Status: AC
  Filled 2022-05-20: qty 100

## 2022-05-20 MED ORDER — BISACODYL 10 MG RE SUPP: 10.0000 mg | Freq: Every day | RECTAL | Status: DC | PRN

## 2022-05-20 MED ORDER — FENTANYL CITRATE (PF) 250 MCG/5ML IJ SOLN
INTRAMUSCULAR | Status: DC | PRN
  Administered 2022-05-20 (×3): 50 ug via INTRAVENOUS

## 2022-05-20 MED ORDER — GLYCOPYRROLATE PF 0.2 MG/ML IJ SOSY
PREFILLED_SYRINGE | INTRAMUSCULAR | Status: AC
  Filled 2022-05-20: qty 1

## 2022-05-20 MED ORDER — SODIUM CHLORIDE 0.9 % IV SOLN: 500.0000 mL | Freq: Once | INTRAVENOUS | Status: DC | PRN

## 2022-05-20 MED ORDER — 0.9 % SODIUM CHLORIDE (POUR BTL) OPTIME
TOPICAL | Status: DC | PRN
  Administered 2022-05-20: 1000 mL

## 2022-05-20 MED ORDER — FENTANYL CITRATE (PF) 250 MCG/5ML IJ SOLN
INTRAMUSCULAR | Status: AC
  Filled 2022-05-20: qty 5

## 2022-05-20 MED ORDER — GLYCOPYRROLATE 0.2 MG/ML IJ SOLN
INTRAMUSCULAR | Status: DC | PRN
  Administered 2022-05-20 (×2): .1 mg via INTRAVENOUS

## 2022-05-20 MED ORDER — CHLORHEXIDINE GLUCONATE 0.12 % MT SOLN
OROMUCOSAL | Status: AC
  Administered 2022-05-20: 15 mL via OROMUCOSAL
  Filled 2022-05-20: qty 15

## 2022-05-20 MED ORDER — CEFAZOLIN SODIUM-DEXTROSE 2-4 GM/100ML-% IV SOLN
2.0000 g | Freq: Three times a day (TID) | INTRAVENOUS | Status: AC
  Administered 2022-05-20 – 2022-05-21 (×2): 2 g via INTRAVENOUS
  Filled 2022-05-20 (×2): qty 100

## 2022-05-20 MED ORDER — LIDOCAINE 2% (20 MG/ML) 5 ML SYRINGE
INTRAMUSCULAR | Status: AC
  Filled 2022-05-20: qty 5

## 2022-05-20 MED ORDER — POLYETHYLENE GLYCOL 3350 17 G PO PACK: 17.0000 g | PACK | Freq: Every day | ORAL | Status: DC | PRN

## 2022-05-20 MED ORDER — LABETALOL HCL 5 MG/ML IV SOLN
INTRAVENOUS | Status: DC | PRN
  Administered 2022-05-20 (×2): 10 mg via INTRAVENOUS

## 2022-05-20 MED ORDER — OXYCODONE HCL 5 MG PO TABS: 5.0000 mg | ORAL_TABLET | Freq: Once | ORAL | Status: DC | PRN

## 2022-05-20 MED ORDER — ACETAMINOPHEN 500 MG PO TABS: 1000.0000 mg | ORAL_TABLET | Freq: Once | ORAL | Status: AC

## 2022-05-20 MED ORDER — MORPHINE SULFATE (PF) 2 MG/ML IV SOLN: 2.0000 mg | INTRAVENOUS | Status: DC | PRN

## 2022-05-20 MED ORDER — PANTOPRAZOLE SODIUM 40 MG PO TBEC
40.0000 mg | DELAYED_RELEASE_TABLET | Freq: Every day | ORAL | Status: DC
  Administered 2022-05-20 – 2022-05-21 (×2): 40 mg via ORAL
  Filled 2022-05-20 (×3): qty 1

## 2022-05-20 MED ORDER — ONDANSETRON HCL 4 MG/2ML IJ SOLN: 4.0000 mg | Freq: Once | INTRAMUSCULAR | Status: DC | PRN

## 2022-05-20 MED ORDER — HEPARIN SODIUM (PORCINE) 1000 UNIT/ML IJ SOLN
INTRAMUSCULAR | Status: DC | PRN
  Administered 2022-05-20: 7000 [IU] via INTRAVENOUS

## 2022-05-20 MED ORDER — ONDANSETRON HCL 4 MG/2ML IJ SOLN: 4.0000 mg | Freq: Four times a day (QID) | INTRAMUSCULAR | Status: DC | PRN

## 2022-05-20 MED ORDER — IODIXANOL 320 MG/ML IV SOLN
INTRAVENOUS | Status: DC | PRN
  Administered 2022-05-20: 24 mL

## 2022-05-20 MED ORDER — MAGNESIUM SULFATE 2 GM/50ML IV SOLN: 2.0000 g | Freq: Every day | INTRAVENOUS | Status: DC | PRN

## 2022-05-20 MED ORDER — HEPARIN 6000 UNIT IRRIGATION SOLUTION
Status: DC | PRN
  Administered 2022-05-20: 1

## 2022-05-20 MED ORDER — HYDRALAZINE HCL 20 MG/ML IJ SOLN
INTRAMUSCULAR | Status: AC
  Filled 2022-05-20: qty 1

## 2022-05-20 MED ORDER — DEXAMETHASONE SODIUM PHOSPHATE 10 MG/ML IJ SOLN
INTRAMUSCULAR | Status: DC | PRN
  Administered 2022-05-20: 10 mg via INTRAVENOUS

## 2022-05-20 MED ORDER — PROTAMINE SULFATE 10 MG/ML IV SOLN
INTRAVENOUS | Status: AC
  Filled 2022-05-20: qty 10

## 2022-05-20 MED ORDER — OXYCODONE HCL 5 MG/5ML PO SOLN: 5.0000 mg | Freq: Once | ORAL | Status: DC | PRN

## 2022-05-20 MED ORDER — PROTAMINE SULFATE 10 MG/ML IV SOLN
INTRAVENOUS | Status: DC | PRN
  Administered 2022-05-20: 50 mg via INTRAVENOUS

## 2022-05-20 MED ORDER — ACETAMINOPHEN 500 MG PO TABS
ORAL_TABLET | ORAL | Status: AC
  Administered 2022-05-20: 1000 mg via ORAL
  Filled 2022-05-20: qty 2

## 2022-05-20 MED ORDER — FENTANYL CITRATE (PF) 100 MCG/2ML IJ SOLN: 25.0000 ug | INTRAMUSCULAR | Status: DC | PRN

## 2022-05-20 MED ORDER — GUAIFENESIN-DM 100-10 MG/5ML PO SYRP: 15.0000 mL | ORAL_SOLUTION | ORAL | Status: DC | PRN

## 2022-05-20 MED ORDER — LIDOCAINE 2% (20 MG/ML) 5 ML SYRINGE
INTRAMUSCULAR | Status: DC | PRN
  Administered 2022-05-20: 60 mg via INTRAVENOUS

## 2022-05-20 MED ORDER — OXYCODONE HCL 5 MG PO TABS: 5.0000 mg | ORAL_TABLET | Freq: Four times a day (QID) | ORAL | Status: DC | PRN

## 2022-05-20 MED ORDER — HEMOSTATIC AGENTS (NO CHARGE) OPTIME
TOPICAL | Status: DC | PRN
  Administered 2022-05-20: 1 via TOPICAL

## 2022-05-20 MED ORDER — CHLORHEXIDINE GLUCONATE 0.12 % MT SOLN: 15.0000 mL | Freq: Once | OROMUCOSAL | Status: AC

## 2022-05-20 MED ORDER — DEXAMETHASONE SODIUM PHOSPHATE 10 MG/ML IJ SOLN
INTRAMUSCULAR | Status: AC
  Filled 2022-05-20: qty 1

## 2022-05-20 MED ORDER — PROPOFOL 10 MG/ML IV BOLUS
INTRAVENOUS | Status: DC | PRN
  Administered 2022-05-20: 80 mg via INTRAVENOUS
  Administered 2022-05-20 (×4): 20 mg via INTRAVENOUS

## 2022-05-20 MED ORDER — LACTATED RINGERS IV SOLN: INTRAVENOUS | Status: DC

## 2022-05-20 MED ORDER — CEFAZOLIN SODIUM-DEXTROSE 2-3 GM-%(50ML) IV SOLR
INTRAVENOUS | Status: DC | PRN
  Administered 2022-05-20: 2 g via INTRAVENOUS

## 2022-05-20 MED ORDER — ROCURONIUM BROMIDE 10 MG/ML (PF) SYRINGE
PREFILLED_SYRINGE | INTRAVENOUS | Status: DC | PRN
  Administered 2022-05-20 (×2): 50 mg via INTRAVENOUS

## 2022-05-20 MED ORDER — PHENYLEPHRINE HCL-NACL 20-0.9 MG/250ML-% IV SOLN
INTRAVENOUS | Status: DC | PRN
  Administered 2022-05-20: 20 ug/min via INTRAVENOUS

## 2022-05-20 MED ORDER — ONDANSETRON HCL 4 MG/2ML IJ SOLN
INTRAMUSCULAR | Status: AC
  Filled 2022-05-20: qty 2

## 2022-05-20 MED ORDER — HYDRALAZINE HCL 20 MG/ML IJ SOLN
10.0000 mg | Freq: Once | INTRAMUSCULAR | Status: AC
  Administered 2022-05-20: 10 mg via INTRAVENOUS

## 2022-05-20 MED ORDER — ORAL CARE MOUTH RINSE: 15.0000 mL | Freq: Once | OROMUCOSAL | Status: AC

## 2022-05-20 MED ORDER — DOCUSATE SODIUM 100 MG PO CAPS
100.0000 mg | ORAL_CAPSULE | Freq: Every day | ORAL | Status: DC
  Administered 2022-05-21: 100 mg via ORAL
  Filled 2022-05-20: qty 1

## 2022-05-20 MED ORDER — SODIUM CHLORIDE 0.9 % IV SOLN: INTRAVENOUS | Status: DC | PRN

## 2022-05-20 MED ORDER — PROPOFOL 10 MG/ML IV BOLUS
INTRAVENOUS | Status: AC
  Filled 2022-05-20: qty 20

## 2022-05-20 MED ORDER — HEPARIN 6000 UNIT IRRIGATION SOLUTION
Status: AC
  Filled 2022-05-20: qty 500

## 2022-05-20 MED ORDER — ENOXAPARIN SODIUM 40 MG/0.4ML IJ SOSY
40.0000 mg | PREFILLED_SYRINGE | INTRAMUSCULAR | Status: DC
  Administered 2022-05-21: 40 mg via SUBCUTANEOUS
  Filled 2022-05-20: qty 0.4

## 2022-05-20 MED ORDER — POTASSIUM CHLORIDE CRYS ER 20 MEQ PO TBCR: 20.0000 meq | EXTENDED_RELEASE_TABLET | Freq: Every day | ORAL | Status: DC | PRN

## 2022-05-20 SURGICAL SUPPLY — 62 items
ADH SKN CLS APL DERMABOND .7 (GAUZE/BANDAGES/DRESSINGS) ×4
BAG BANDED W/RUBBER/TAPE 36X54 (MISCELLANEOUS) ×2 IMPLANT
BAG COUNTER SPONGE SURGICOUNT (BAG) ×2 IMPLANT
BAG EQP BAND 135X91 W/RBR TAPE (MISCELLANEOUS) ×2
BAG SPNG CNTER NS LX DISP (BAG) ×2
BALLN STERLING RX 6X20X80 (BALLOONS) ×2
BALLOON STERLING RX 6X20X80 (BALLOONS) IMPLANT
CANISTER SUCT 3000ML PPV (MISCELLANEOUS) ×2 IMPLANT
CATH ROBINSON RED A/P 18FR (CATHETERS) IMPLANT
CLIP LIGATING EXTRA MED SLVR (CLIP) ×2 IMPLANT
CLIP LIGATING EXTRA SM BLUE (MISCELLANEOUS) ×2 IMPLANT
COVER DOME SNAP 22 D (MISCELLANEOUS) ×2 IMPLANT
COVER PROBE W GEL 5X96 (DRAPES) ×2 IMPLANT
DERMABOND ADVANCED .7 DNX12 (GAUZE/BANDAGES/DRESSINGS) ×2 IMPLANT
DRAPE FEMORAL ANGIO 80X135IN (DRAPES) ×2 IMPLANT
ELECT REM PT RETURN 9FT ADLT (ELECTROSURGICAL) ×2
ELECTRODE REM PT RTRN 9FT ADLT (ELECTROSURGICAL) ×2 IMPLANT
GLOVE BIO SURGEON STRL SZ7.5 (GLOVE) ×2 IMPLANT
GOWN STRL REUS W/ TWL LRG LVL3 (GOWN DISPOSABLE) ×4 IMPLANT
GOWN STRL REUS W/ TWL XL LVL3 (GOWN DISPOSABLE) ×2 IMPLANT
GOWN STRL REUS W/TWL LRG LVL3 (GOWN DISPOSABLE) ×4
GOWN STRL REUS W/TWL XL LVL3 (GOWN DISPOSABLE) ×2
GUIDEWIRE ENROUTE 0.014 (WIRE) ×2 IMPLANT
HEMOSTAT SNOW SURGICEL 2X4 (HEMOSTASIS) IMPLANT
INSERT FOGARTY SM (MISCELLANEOUS) IMPLANT
INTRODUCER KIT GALT 7CM (INTRODUCER) ×2
KIT BASIN OR (CUSTOM PROCEDURE TRAY) ×2 IMPLANT
KIT ENCORE 26 ADVANTAGE (KITS) ×2 IMPLANT
KIT INTRODUCER GALT 7 (INTRODUCER) ×2 IMPLANT
KIT TURNOVER KIT B (KITS) ×2 IMPLANT
NDL HYPO 25GX1X1/2 BEV (NEEDLE) IMPLANT
NEEDLE HYPO 25GX1X1/2 BEV (NEEDLE) IMPLANT
PACK CAROTID (CUSTOM PROCEDURE TRAY) ×2 IMPLANT
POSITIONER HEAD DONUT 9IN (MISCELLANEOUS) ×2 IMPLANT
PROTECTION STATION PRESSURIZED (MISCELLANEOUS)
SET MICROPUNCTURE 5F STIFF (MISCELLANEOUS) IMPLANT
SHEATH AVANTI 11CM 5FR (SHEATH) IMPLANT
SHUNT CAROTID BYPASS 10 (VASCULAR PRODUCTS) IMPLANT
SHUNT CAROTID BYPASS 12FRX15.5 (VASCULAR PRODUCTS) IMPLANT
STATION PROTECTION PRESSURIZED (MISCELLANEOUS) IMPLANT
STENT TRANSCAROTID SYSTEM 9X40 (Permanent Stent) IMPLANT
SUT MNCRL AB 4-0 PS2 18 (SUTURE) ×2 IMPLANT
SUT PROLENE 5 0 C 1 24 (SUTURE) ×2 IMPLANT
SUT PROLENE 6 0 BV (SUTURE) IMPLANT
SUT PROLENE 7 0 BV 1 (SUTURE) IMPLANT
SUT SILK 2 0 PERMA HAND 18 BK (SUTURE) ×2 IMPLANT
SUT SILK 2 0 SH (SUTURE) IMPLANT
SUT SILK 2 0 SH CR/8 (SUTURE) ×2 IMPLANT
SUT SILK 3 0 (SUTURE)
SUT SILK 3-0 18XBRD TIE 12 (SUTURE) IMPLANT
SUT VIC AB 3-0 SH 27 (SUTURE) ×2
SUT VIC AB 3-0 SH 27X BRD (SUTURE) ×2 IMPLANT
SYR 10ML LL (SYRINGE) ×6 IMPLANT
SYR 20ML LL LF (SYRINGE) ×2 IMPLANT
SYR CONTROL 10ML LL (SYRINGE) IMPLANT
SYSTEM TRANSCAROTID NEUROPRTCT (MISCELLANEOUS) ×2 IMPLANT
TOWEL GREEN STERILE (TOWEL DISPOSABLE) ×2 IMPLANT
TRANSCAROTID NEUROPROTECT SYS (MISCELLANEOUS) ×2
TUBING ART PRESS 48 MALE/FEM (TUBING) IMPLANT
TUBING EXTENTION W/L.L. (IV SETS) IMPLANT
WATER STERILE IRR 1000ML POUR (IV SOLUTION) ×2 IMPLANT
WIRE BENTSON .035X145CM (WIRE) ×2 IMPLANT

## 2022-05-20 NOTE — Op Note (Signed)
Patient name: Margaret Butler MRN: [id number] DOB: 06/27/1941 Sex: female  05/20/2022 Pre-operative Diagnosis: Symptomatic right ICA stenosis Post-operative diagnosis:  Same Surgeon:  Apolinar Junes C. Randie Heinz, MD Assistants: Doreatha Massed, PA; Derwood Kaplan, MS3 Procedure Performed: 1.  Right transcarotid artery stenting with 9 x 33mm EnRoute stent with flow reversal neuro protection 2.  Ultrasound-guided cannulation left common femoral vein for placement of flow reversal sheath   Indications: 81 year old female with a history of left carotid endarterectomy followed by left transcarotid artery stenting now with symptomatic right ICA stenosis indicated for right ICA stenting.  She is on aspirin, Plavix and a statin we have discussed the risk benefits alternatives and she agrees to proceed.  Findings: The right ICA had greater than 60% stenosis just at the bifurcation which was moderately calcified.  After stenting there was a residual waist and this was ballooned to less than 20% stenosis   Procedure:  The patient was identified in the holding area and taken to the operating room where she was placed supine operative when general anesthesia was induced.  She was sterilely prepped draped in the right neck and chest in the bilateral groins in usual fashion, antibiotics were ministered a timeout was called.  Was used to identify the right common carotid artery and this was marked on the skin between the 2 heads of the sternocleidomastoid.  Ultrasound was then used to identify the left common femoral vein and this was cannulated with micropuncture needle followed by wire and a sheath.  We placed a Bentson wire followed by a the 8 [place] flow reversal sheath and this was sutured to the skin with silk suture.  Vertical incision was then made between the 2 heads of the sternocleidomastoid we dissected down through the 2 heads bluntly then identify the IJ this was retracted laterally and the common carotid  artery was identified and we encircled this proximally with umbilical tape and vessel loop in a Potts configuration the patient was fully heparinized.  We then placed a U stitch in the common carotid artery in the expected cannulation zone and then cannulated the common carotid artery with micropuncture needle followed by wire to 3 cm and sheath 3 cm.  Angiography was performed in AP and 30 degree RAO views.  We then placed the J configured Amplatz wire to the carotid bifurcation and placed the flow reversal sheath and this was affixed to the skin in 2 locations and flow reversal was initiated and confirmed with flushing of the femoral sheath.  We then performed transcarotid artery revascularization timeout the pressure was adequate as was the heart rate patient had been pretreated with Robinul and the stent and balloon were prepared.  The common carotid artery was then clamped with ACT greater than 250.  We then crossed the lesion with a 1 4 wire this was placed into the intracerebral ICA and the lesion was predilated with a 5 x 40 mm balloon and we primarily stented with a 9 x 40 stent.  At completion multiple views demonstrated continued compression of the stent proximally with a waist and this was ballooned with a 6 x 20 mm balloon and this was reduced to less than 20% stenosis.  We allowed additional 2 minutes of washout after the final ballooning.  We then performed angiography in 2 views which demonstrated minimal residual stenosis.  With this the wire was removed the clamp was removed.  Flow reversal was discontinued blood return to the left common femoral sheath.  We  remove the femoral sheath and pressure was held.  In the neck we removed the sheath and cinched our U stitch and Doppler demonstrated good flow both proximal and distal to our cannulation zone.  50 mg of protamine was administered.  We obtained hemostasis and irrigated the wound and closed the platysma with Vicryl and the skin with Monocryl.   Dermabond was placed at the skin level.  Patient was then awakened from anesthesia and was noted to be neurologically intact and was transferred to the recovery area in stable condition.  All counts were correct at completion.  Contrast: 24 cc  EBL: 20 cc  Virginie Josten C. Donzetta Matters, MD [facility] Office: [phone] Pager: [phone]

## 2022-05-20 NOTE — Transfer of Care (Signed)
Immediate Anesthesia Transfer of Care Note  Patient: Margaret Butler  Procedure(s) Performed: Right Transcarotid Artery Revascularization (Right) ULTRASOUND GUIDANCE FOR VASCULAR ACCESS (Right: Neck)  Patient Location: PACU  Anesthesia Type:General  Level of Consciousness: drowsy and patient cooperative  Airway & Oxygen Therapy: Patient Spontanous Breathing  Post-op Assessment: Report given to RN and Post -op Vital signs reviewed and stable  Post vital signs: Reviewed and stable  Last Vitals:  Vitals Value Taken Time  BP 161/56 05/20/22 1501  Temp    Pulse 74 05/20/22 1502  Resp 12 05/20/22 1502  SpO2 92 % 05/20/22 1502  Vitals shown include unvalidated device data.  Last Pain:  Vitals:   05/20/22 1127  TempSrc:   PainSc: 0-No pain      Patients Stated Pain Goal: 0 (61/44/31 5400)  Complications: No notable events documented.

## 2022-05-20 NOTE — Anesthesia Procedure Notes (Signed)
Arterial Line Insertion Start/End10/05/2022 12:15 PM Performed by: Janene Harvey, CRNA  Patient location: Pre-op. Preanesthetic checklist: patient identified, risks and benefits discussed and monitors and equipment checked Lidocaine 1% used for infiltration Right, radial was placed Catheter size: 20 G Hand hygiene performed   Attempts: 1 Procedure performed without using ultrasound guided technique. Following insertion, dressing applied and Biopatch. Post procedure assessment: unchanged  Patient tolerated the procedure well with no immediate complications.

## 2022-05-20 NOTE — Plan of Care (Signed)
  Problem: Health Behavior/Discharge Planning: Goal: Ability to manage health-related needs will improve Outcome: Progressing   Problem: Health Behavior/Discharge Planning: Goal: Ability to manage health-related needs will improve Outcome: Progressing   Problem: Clinical Measurements: Goal: Respiratory complications will improve Outcome: Progressing Goal: Cardiovascular complication will be avoided Outcome: Progressing   Problem: Activity: Goal: Risk for activity intolerance will decrease Outcome: Progressing

## 2022-05-20 NOTE — Anesthesia Preprocedure Evaluation (Addendum)
Anesthesia Evaluation  Patient identified by MRN, date of birth, ID band Patient awake    Reviewed: Allergy & Precautions, NPO status , Patient's Chart, lab work & pertinent test results  History of Anesthesia Complications Negative for: history of anesthetic complications  Airway Mallampati: III  TM Distance: >3 FB Neck ROM: Full    Dental  (+) Dental Advisory Given, Teeth Intact   Pulmonary neg pulmonary ROS,    Pulmonary exam normal        Cardiovascular hypertension, Pt. on medications + CAD and + Peripheral Vascular Disease  Normal cardiovascular exam   '23 Rt Carotid US - 60-79% right ICAS  S/p left TCAR  '23 TTE - EF 50 to 55%. Moderate concentric left ventricular hypertrophy. Grade I diastolic dysfunction  (impaired relaxation). Left atrial size was mildly dilated. Right atrial size was mildly dilated. A small pericardial effusion is present. Trivial mitral valve regurgitation.  Aortic valve regurgitation is trivial.    Neuro/Psych PSYCHIATRIC DISORDERS Depression TIA   GI/Hepatic Neg liver ROS, GERD  Controlled,  Endo/Other  diabetes, Type 2  Renal/GU negative Renal ROS     Musculoskeletal  (+) Arthritis ,   Abdominal   Peds  Hematology negative hematology ROS (+)   Anesthesia Other Findings   Reproductive/Obstetrics                            Anesthesia Physical Anesthesia Plan  ASA: 3  Anesthesia Plan: General   Post-op Pain Management: Tylenol PO (pre-op)*   Induction: Intravenous  PONV Risk Score and Plan: 3 and Treatment may vary due to age or medical condition, Ondansetron and Propofol infusion  Airway Management Planned: Oral ETT  Additional Equipment: Arterial line  Intra-op Plan:   Post-operative Plan: Extubation in OR  Informed Consent: I have reviewed the patients History and Physical, chart, labs and discussed the procedure including the risks,  benefits and alternatives for the proposed anesthesia with the patient or authorized representative who has indicated his/her understanding and acceptance.     Dental advisory given  Plan Discussed with: CRNA and Anesthesiologist  Anesthesia Plan Comments:        Anesthesia Quick Evaluation

## 2022-05-20 NOTE — Discharge Instructions (Signed)
   [facility]  Discharge Instructions   Carotid Surgery  Please refer to the following instructions for your post-procedure care. Your surgeon or physician assistant will discuss any changes with you.  Activity  You are encouraged to walk as much as you can. You can slowly return to normal activities but must avoid strenuous activity and heavy lifting until your doctor tell you it's okay. Avoid activities such as vacuuming or swinging a golf club. You can drive after one week if you are comfortable and you are no longer taking prescription pain medications. It is normal to feel tired for serval weeks after your surgery. It is also normal to have difficulty with sleep habits, eating, and bowel movements after surgery. These will go away with time.  Bathing/Showering  Shower daily after you go home. Do not soak in a bathtub, hot tub, or swim until the incision heals completely.  Incision Care  Shower every day. Clean your incision with mild soap and water. Pat the area dry with a clean towel. You do not need a bandage unless otherwise instructed. Do not apply any ointments or creams to your incision. You may have skin glue on your incision. Do not peel it off. It will come off on its own in about one week. Your incision may feel thickened and raised for several weeks after your surgery. This is normal and the skin will soften over time.   For Men Only: It's okay to shave around the incision but do not shave the incision itself for 2 weeks. It is common to have numbness under your chin that could last for several months.  Diet  Resume your normal diet. There are no special food restrictions following this procedure. A low fat/low cholesterol diet is recommended for all patients with vascular disease. In order to heal from your surgery, it is CRITICAL to get adequate nutrition. Your body requires vitamins, minerals, and protein. Vegetables are the best source of  vitamins and minerals. Vegetables also provide the perfect balance of protein. Processed food has little nutritional value, so try to avoid this.  Medications  Resume taking all of your medications unless your doctor or physician assistant tells you not to. If your incision is causing pain, you may take over-the- counter pain relievers such as acetaminophen (Tylenol). If you were prescribed a stronger pain medication, please be aware these medications can cause nausea and constipation. Prevent nausea by taking the medication with a snack or meal. Avoid constipation by drinking plenty of fluids and eating foods with a high amount of fiber, such as fruits, vegetables, and grains.   Do not take Tylenol if you are taking prescription pain medications.  Follow Up  Our office will schedule a follow up appointment 2-3 weeks following discharge.  Please call us immediately for any of the following conditions  . Increased pain, redness, drainage (pus) from your incision site. . Fever of 101 degrees or higher. . If you should develop stroke (slurred speech, difficulty swallowing, weakness on one side of your body, loss of vision) you should call 911 and go to the nearest emergency room. .  Reduce your risk of vascular disease:  . Stop smoking. If you would like help call [facility] at 1-800-QUIT-NOW (1-800-784-8669) or [place] at 336-586-4000. . Manage your cholesterol . Maintain a desired weight . Control your diabetes . Keep your blood pressure down .  If you have any questions, please call the office at [phone]. 

## 2022-05-20 NOTE — Progress Notes (Signed)
Patient arrived to 4E from PACU. Vitals taken and stable. Patient oriented to unit and staff. Tele placed and CCMD notified. Right neck and left groin assessed and level 0. Call bell within reach and family at the bedside.  Margaret Butler

## 2022-05-20 NOTE — Anesthesia Procedure Notes (Signed)
Procedure Name: Intubation Date/Time: 05/20/2022 1:25 PM  Performed by: Lance Coon, CRNAPre-anesthesia Checklist: Patient identified, Emergency Drugs available, Suction available, Patient being monitored and Timeout performed Patient Re-evaluated:Patient Re-evaluated prior to induction Oxygen Delivery Method: Circle system utilized Preoxygenation: Pre-oxygenation with 100% oxygen Induction Type: IV induction Ventilation: Mask ventilation without difficulty Laryngoscope Size: Miller and 3 Grade View: Grade I Tube type: Oral Tube size: 7.5 mm Number of attempts: 1 Airway Equipment and Method: Stylet Placement Confirmation: ETT inserted through vocal cords under direct vision, positive ETCO2 and breath sounds checked- equal and bilateral Secured at: 21 cm Tube secured with: Tape Dental Injury: Teeth and Oropharynx as per pre-operative assessment

## 2022-05-20 NOTE — OR Nursing (Signed)
Pre-op assessment done at 1250 05/20/2022. Pt stated numbness or tingling but mild weakness to left upper extremity and grip. Generalized weakness and had some trouble sliding over to OR table on her own, needed some assistance. Otherwise, neuro intact.

## 2022-05-20 NOTE — Progress Notes (Signed)
  Progress Note    05/20/2022 10:27 AM * No surgery date entered *  Subjective: no new complaints  Vitals:   05/20/22 0452 05/20/22 0817  BP: (!) 160/73 (!) 181/63  Pulse: 76 71  Resp: 18 18  Temp: 98.7 F (37.1 C) 98.1 F (36.7 C)  SpO2: 96% 96%    Physical Exam: Aaox3 Neuro at baseline  CBC    Component Value Date/Time   WBC 8.1 05/17/2022 0342   RBC 4.44 05/17/2022 0342   HGB 12.2 05/17/2022 0342   HCT 37.5 05/17/2022 0342   PLT 226 05/17/2022 0342   MCV 84.5 05/17/2022 0342   MCH 27.5 05/17/2022 0342   MCHC 32.5 05/17/2022 0342   RDW 13.7 05/17/2022 0342   LYMPHSABS 1.9 05/15/2022 1227   MONOABS 0.9 05/15/2022 1227   EOSABS 0.1 05/15/2022 1227   BASOSABS 0.1 05/15/2022 1227    BMET    Component Value Date/Time   NA 139 05/17/2022 0342   K 3.7 05/17/2022 0342   CL 109 05/17/2022 0342   CO2 22 05/17/2022 0342   GLUCOSE 149 (H) 05/17/2022 0342   BUN 12 05/17/2022 0342   CREATININE 0.56 05/17/2022 0342   CALCIUM 8.9 05/17/2022 0342   GFRNONAA >60 05/17/2022 0342   GFRAA >60 09/16/2019 0936    INR    Component Value Date/Time   INR 1.0 05/15/2022 1227     Intake/Output Summary (Last 24 hours) at 05/20/2022 1027 Last data filed at 05/20/2022 0300 Gross per 24 hour  Intake --  Output 800 ml  Net -800 ml     Assessment:  81 y.o. female is here with symptomatic R ICA stenosis.  Plan: OR today for R TCAR Again discussed risks, benefits, alternatives  Ashton Belote C. Donzetta Matters, MD [facility] Office: [phone] Pager: [phone]  05/20/2022 10:27 AM

## 2022-05-20 NOTE — Anesthesia Postprocedure Evaluation (Signed)
Anesthesia Post Note  Patient: Margaret Butler  Procedure(s) Performed: Right Transcarotid Artery Revascularization (Right) ULTRASOUND GUIDANCE FOR VASCULAR ACCESS (Right: Neck)     Patient location during evaluation: PACU Anesthesia Type: General Level of consciousness: awake and alert Pain management: pain level controlled Vital Signs Assessment: post-procedure vital signs reviewed and stable Respiratory status: spontaneous breathing, nonlabored ventilation and respiratory function stable Cardiovascular status: stable and blood pressure returned to baseline Anesthetic complications: no   No notable events documented.  Last Vitals:  Vitals:   05/20/22 1600 05/20/22 1615  BP: (!) 141/53 (!) 128/45  Pulse: 62 64  Resp: 15 14  Temp: (!) 36.4 C   SpO2: 97% 96%    Last Pain:  Vitals:   05/20/22 1600  TempSrc: Oral  PainSc: 0-No pain                 Audry Pili

## 2022-05-21 ENCOUNTER — Other Ambulatory Visit: Payer: Self-pay

## 2022-05-21 ENCOUNTER — Encounter (HOSPITAL_COMMUNITY): Payer: Self-pay | Admitting: Vascular Surgery

## 2022-05-21 DIAGNOSIS — K219 Gastro-esophageal reflux disease without esophagitis: Secondary | ICD-10-CM

## 2022-05-21 DIAGNOSIS — I639 Cerebral infarction, unspecified: Secondary | ICD-10-CM | POA: Diagnosis not present

## 2022-05-21 DIAGNOSIS — F32A Depression, unspecified: Secondary | ICD-10-CM

## 2022-05-21 LAB — BASIC METABOLIC PANEL
Anion gap: 8 (ref 5–15)
BUN: 16 mg/dL (ref 8–23)
CO2: 23 mmol/L (ref 22–32)
Calcium: 8.6 mg/dL — ABNORMAL LOW (ref 8.9–10.3)
Chloride: 104 mmol/L (ref 98–111)
Creatinine, Ser: 0.67 mg/dL (ref 0.44–1.00)
GFR, Estimated: >60 mL/min (ref >60)
Glucose, Bld: 180 mg/dL — ABNORMAL HIGH (ref 70–99)
Potassium: 4.1 mmol/L (ref 3.5–5.1)
Sodium: 135 mmol/L (ref 135–145)

## 2022-05-21 LAB — CBC
HCT: 35.2 % — ABNORMAL LOW (ref 36.0–46.0)
Hemoglobin: 11.8 g/dL — ABNORMAL LOW (ref 12.0–15.0)
MCH: 28.1 pg (ref 26.0–34.0)
MCHC: 33.5 g/dL (ref 30.0–36.0)
MCV: 83.8 fL (ref 80.0–100.0)
Platelets: 208 10*3/uL (ref 150–400)
RBC: 4.2 MIL/uL (ref 3.87–5.11)
RDW: 13.9 % (ref 11.5–15.5)
WBC: 12.8 10*3/uL — ABNORMAL HIGH (ref 4.0–10.5)
nRBC: 0 % (ref 0.0–0.2)

## 2022-05-21 LAB — LIPID PANEL
Cholesterol: 145 mg/dL (ref 0–200)
HDL: 48 mg/dL (ref >40)
LDL Cholesterol: 65 mg/dL (ref 0–99)
Total CHOL/HDL Ratio: 3 RATIO
Triglycerides: 158 mg/dL — ABNORMAL HIGH (ref <150)
VLDL: 32 mg/dL (ref 0–40)

## 2022-05-21 MED ORDER — LIDOCAINE 2% (20 MG/ML) 5 ML SYRINGE
INTRAMUSCULAR | Status: AC
  Filled 2022-05-21: qty 5

## 2022-05-21 MED ORDER — ASPIRIN 325 MG PO TBEC: 325.0000 mg | DELAYED_RELEASE_TABLET | Freq: Every day | ORAL | 0 refills | Status: DC

## 2022-05-21 MED ORDER — ESCITALOPRAM OXALATE 10 MG PO TABS: 10.0000 mg | ORAL_TABLET | Freq: Every day | ORAL | 0 refills | Status: DC

## 2022-05-21 MED ORDER — CLOPIDOGREL BISULFATE 75 MG PO TABS: 75.0000 mg | ORAL_TABLET | Freq: Every day | ORAL | 3 refills | Status: DC

## 2022-05-21 NOTE — Progress Notes (Signed)
PHARMACIST LIPID MONITORING   Margaret Butler is a 81 y.o. female admitted on 05/15/2022 for right transcarotid artery stenting .  Pharmacy has been consulted to optimize lipid-lowering therapy with the indication of secondary prevention for clinical ASCVD.  Recent Labs:  Lipid Panel (last 6 months):   Lab Results  Component Value Date   CHOL 145 05/21/2022   TRIG 158 (H) 05/21/2022   HDL 48 05/21/2022   CHOLHDL 3.0 05/21/2022   VLDL 32 05/21/2022   LDLCALC 65 05/21/2022    Hepatic function panel (last 6 months):   Lab Results  Component Value Date   AST 23 05/15/2022   ALT 16 05/15/2022   ALKPHOS 47 05/15/2022   BILITOT 0.9 05/15/2022    SCr (since admission):   Serum creatinine: 0.67 mg/dL 05/21/22 0335 Estimated creatinine clearance: 51.6 mL/min  Current therapy and lipid therapy tolerance Current lipid-lowering therapy: Repatha Previous lipid-lowering therapies (if applicable): simvastatin, atorvastatin Documented or reported allergies or intolerances to lipid-lowering therapies (if applicable): simvastatin, atorvastatin - myalgias  Assessment:   Patient prefers no changes in lipid-lowering therapy at this time due to being at goal on repatha therapy.   Plan:    1.Statin intensity (high intensity recommended for all patients regardless of the LDL):  Statin intolerance noted. No statin changes due to serious side effects (ex. Myalgias with at least 2 different statins).  2.Add ezetimibe (if any one of the following):   Not indicated at this time.  3.Refer to lipid clinic:   No  4.Follow-up with:  Primary care provider - Kristopher Glee., MD  5.Follow-up labs after discharge:  No changes in lipid therapy, repeat a lipid panel in one year.     Erin Hearing PharmD., BCPS Clinical Pharmacist 05/21/2022 7:32 AM

## 2022-05-21 NOTE — Discharge Summary (Addendum)
Stroke Discharge Summary  Patient ID: Margaret Butler   MRN: [id number]      DOB: Jul 23, 1941  Date of Admission: 05/15/2022 Date of Discharge: 05/21/2022  Attending Physician:  No att. providers found, Stroke MD Consultant(s):   Treatment Team:  Victorino Sparrow, MD Maeola Harman, MD  Patient's PCP:  Andreas Blower., MD  DISCHARGE DIAGNOSIS:  Principal Problem:   Acute ischemic stroke (HCC):R Anterior choroidalartery territory infarct including optic radiation and PLIC s/p IV TNK, likely due to right carotid stenosis   Active Problems:   Hypertension   Hyperlipidemia   Left carotid stenosis   Depression   GERD (gastroesophageal reflux disease) S/p right carotid TCAR 05/20/2022 Dr Randie Heinz  Allergies as of 05/21/2022       Reactions   Lipitor [atorvastatin] Other (See Comments)   Muscle pain   Zestril [lisinopril] Cough      Zoloft [sertraline] Other (See Comments)   Caused headaches        Medication List     TAKE these medications    aspirin EC 325 MG tablet Take 1 tablet (325 mg total) by mouth daily. Start taking on: May 22, 2022 What changed:  medication strength how much to take   clopidogrel 75 MG tablet Commonly known as: PLAVIX Take 1 tablet (75 mg total) by mouth daily.   escitalopram 10 MG tablet Commonly known as: LEXAPRO Take 1 tablet (10 mg total) by mouth daily. Start taking on: May 22, 2022   hydrALAZINE 50 MG tablet Commonly known as: APRESOLINE Take 50 mg by mouth 2 (two) times daily.   lamoTRIgine 25 MG tablet Commonly known as: LAMICTAL Take 25 mg by mouth daily.   losartan 100 MG tablet Commonly known as: COZAAR Take 100 mg by mouth daily.   Repatha SureClick 140 MG/ML Soaj Generic drug: Evolocumab Inject 140 mg into the skin See admin instructions. Every 2 weeks on the 1st and 15th of each month   Vitamin D 50 MCG (2000 UT) tablet Take 2,000 Units by mouth daily.        LABORATORY STUDIES CBC     Component Value Date/Time   WBC 12.8 (H) 05/21/2022 0335   RBC 4.20 05/21/2022 0335   HGB 11.8 (L) 05/21/2022 0335   HCT 35.2 (L) 05/21/2022 0335   PLT 208 05/21/2022 0335   MCV 83.8 05/21/2022 0335   MCH 28.1 05/21/2022 0335   MCHC 33.5 05/21/2022 0335   RDW 13.9 05/21/2022 0335   LYMPHSABS 1.9 05/15/2022 1227   MONOABS 0.9 05/15/2022 1227   EOSABS 0.1 05/15/2022 1227   BASOSABS 0.1 05/15/2022 1227   CMP    Component Value Date/Time   NA 135 05/21/2022 0335   K 4.1 05/21/2022 0335   CL 104 05/21/2022 0335   CO2 23 05/21/2022 0335   GLUCOSE 180 (H) 05/21/2022 0335   BUN 16 05/21/2022 0335   CREATININE 0.67 05/21/2022 0335   CALCIUM 8.6 (L) 05/21/2022 0335   PROT 6.1 (L) 05/15/2022 1227   ALBUMIN 3.3 (L) 05/15/2022 1227   AST 23 05/15/2022 1227   ALT 16 05/15/2022 1227   ALKPHOS 47 05/15/2022 1227   BILITOT 0.9 05/15/2022 1227   GFRNONAA >60 05/21/2022 0335   GFRAA >60 09/16/2019 0936   COAGS Lab Results  Component Value Date   INR 1.0 05/15/2022   INR 1.0 09/14/2019   INR 0.9 07/20/2019   Lipid Panel    Component Value Date/Time   CHOL 145  05/21/2022 0335   TRIG 158 (H) 05/21/2022 0335   HDL 48 05/21/2022 0335   CHOLHDL 3.0 05/21/2022 0335   VLDL 32 05/21/2022 0335   LDLCALC 65 05/21/2022 0335   HgbA1C  Lab Results  Component Value Date   HGBA1C 6.8 (H) 05/15/2022   Urinalysis    Component Value Date/Time   COLORURINE STRAW (A) 05/15/2022 1224   APPEARANCEUR CLEAR 05/15/2022 1224   LABSPEC 1.026 05/15/2022 1224   PHURINE 7.0 05/15/2022 1224   GLUCOSEU NEGATIVE 05/15/2022 1224   HGBUR NEGATIVE 05/15/2022 1224   BILIRUBINUR NEGATIVE 05/15/2022 1224   KETONESUR NEGATIVE 05/15/2022 1224   PROTEINUR NEGATIVE 05/15/2022 1224   NITRITE NEGATIVE 05/15/2022 1224   LEUKOCYTESUR NEGATIVE 05/15/2022 1224   Urine Drug Screen     Component Value Date/Time   LABOPIA NONE DETECTED 05/15/2022 1224   COCAINSCRNUR NONE DETECTED 05/15/2022 1224   LABBENZ  NONE DETECTED 05/15/2022 1224   AMPHETMU NONE DETECTED 05/15/2022 1224   THCU NONE DETECTED 05/15/2022 1224   LABBARB NONE DETECTED 05/15/2022 1224    Alcohol Level    Component Value Date/Time   ETH <10 05/15/2022 1246     SIGNIFICANT DIAGNOSTIC STUDIES Structural Heart Procedure  Result Date: 05/21/2022 See surgical note for result.  DG C-Arm 1-60 Min-No Report  Result Date: 05/20/2022 Fluoroscopy was utilized by the requesting physician.  No radiographic interpretation.   VAS US CAROTID  Result Date: 05/17/2022 Carotid Arterial Duplex Study Patient Name:  KORBIN NOTARO  Date of Exam:   05/17/2022 Medical Rec #: [id number]        Accession #:    [id number] Date of Birth: February 17, 1941        Patient Gender: F Patient Age:   33 years Exam Location:  [facility] Procedure:      VAS US CAROTID Referring Phys: Ivin Booty ROBINS --------------------------------------------------------------------------------  Indications:       CVA, preoperative right TCAR. Left endarterectomy 02/15/19.                    Left TCAR 07/20/19. Risk Factors:      Hypertension, hyperlipidemia, coronary artery disease, PAD. Comparison Study:  Prior Carotid duplex indicating 40-59% right ICA stenosis                    done 06/06/19 Performing Technologist: Sherren Kerns RVS  Examination Guidelines: A complete evaluation includes B-mode imaging, spectral Doppler, color Doppler, and power Doppler as needed of all accessible portions of each vessel. Bilateral testing is considered an integral part of a complete examination. Limited examinations for reoccurring indications may be performed as noted.  Right Carotid Findings: +----------+--------+--------+--------+------------------+---------+           PSV cm/sEDV cm/sStenosisPlaque DescriptionComments  +----------+--------+--------+--------+------------------+---------+ CCA Prox  61      11                                           +----------+--------+--------+--------+------------------+---------+ CCA Distal79      11                                          +----------+--------+--------+--------+------------------+---------+ ICA Prox  215     62      60-79%  calcific  Shadowing +----------+--------+--------+--------+------------------+---------+ ICA Mid   227     49                                          +----------+--------+--------+--------+------------------+---------+ ICA Distal81      25                                          +----------+--------+--------+--------+------------------+---------+ ECA       351     9       >50%    calcific                    +----------+--------+--------+--------+------------------+---------+ +----------+--------+-------+--------+-------------------+           PSV cm/sEDV cmsDescribeArm Pressure (mmHG) +----------+--------+-------+--------+-------------------+ [id number]                                        +----------+--------+-------+--------+-------------------+ +---------+--------+--+--------+--+ VertebralPSV cm/s45EDV cm/s12 +---------+--------+--+--------+--+ Higher velocities may be obscured by severe plaque and acoustic shadowing.  Summary: Right Carotid: Velocities in the right ICA are consistent with a 60-79%                stenosis. Higher velocities may be obscured by severe plaque and                acoustic shadowing. Vertebrals:  Right vertebral artery demonstrates antegrade flow. Subclavians: Normal flow hemodynamics were seen in the right subclavian artery. *See table(s) above for measurements and observations.  Electronically signed by Gerarda Fraction on 05/17/2022 at 11:23:21 AM.    Final    MR BRAIN WO CONTRAST  Result Date: 05/16/2022 CLINICAL DATA:  Left-sided weakness. EXAM: MRI HEAD WITHOUT CONTRAST TECHNIQUE: Multiplanar, multiecho pulse sequences of the brain and surrounding structures were obtained without  intravenous contrast. COMPARISON:  Brain MRI 09/14/2019 FINDINGS: Brain: Restricted diffusion in the medial right thalamus and low posterior limb of the right internal capsule, reaching the globus pallidus. Chronic small vessel ischemia in the cerebral white matter. Chronic lacunar infarct in the right caudate head. Small chronic infarcts in the bilateral cerebellum. Small chronic blood products in the deep white matter. No acute hemorrhage, hydrocephalus, or masslike finding. Vascular: Major flow voids are preserved Skull and upper cervical spine: Normal marrow signal Sinuses/Orbits: Bilateral cataract resection IMPRESSION: 1. Acute infarct in the right medial temporal lobe and posterior limb of the internal capsule, possibly anterior choroidal. 2. Background of chronic small vessel ischemia with chronic small vessel infarcts. Electronically Signed   By: Tiburcio Pea M.D.   On: 05/16/2022 12:27   ECHOCARDIOGRAM COMPLETE  Result Date: 05/16/2022    ECHOCARDIOGRAM REPORT   Patient Name:   ADLAI NIEBLAS Date of Exam: 05/15/2022 Medical Rec #:  [id number]       Height:       63.5 in Accession #:    [id number]      Weight:       147.7 lb Date of Birth:  1941/02/07       BSA:          1.710 m Patient Age:    81 years        BP:  174/58 mmHg Patient Gender: F               HR:           73 bpm. Exam Location:  Inpatient Procedure: 2D Echo, Cardiac Doppler and Color Doppler Indications:    Stroke  History:        Patient has no prior history of Echocardiogram examinations.                 CAD; Risk Factors:Hypertension, Diabetes and Dyslipidemia.  Sonographer:    Jefferey Pica Referring Phys: [id number] ASHISH ARORA IMPRESSIONS  1. Left ventricular ejection fraction, by estimation, is 50 to 55%. The left ventricle has low normal function. The left ventricle has no regional wall motion abnormalities. There is moderate concentric left ventricular hypertrophy. Left ventricular diastolic parameters are  consistent with Grade I diastolic dysfunction (impaired relaxation).  2. Right ventricular systolic function is normal. The right ventricular size is normal. Tricuspid regurgitation signal is inadequate for assessing PA pressure.  3. Left atrial size was mildly dilated.  4. Right atrial size was mildly dilated.  5. A small pericardial effusion is present.  6. The mitral valve is degenerative. Trivial mitral valve regurgitation. No evidence of mitral stenosis.  7. The aortic valve is grossly normal. There is mild calcification of the aortic valve. There is mild thickening of the aortic valve. Aortic valve regurgitation is trivial. No aortic stenosis is present.  8. The inferior vena cava is normal in size with greater than 50% respiratory variability, suggesting right atrial pressure of 3 mmHg. Conclusion(s)/Recommendation(s): Concentric LVH and suboptimal visualization of LV apex. Consider repeat focused echo with strain and Definity contrast to fully evaluate LVH. Consider evaluation for cardiac amyloidosis if clincially indicated. FINDINGS  Left Ventricle: Left ventricular ejection fraction, by estimation, is 50 to 55%. The left ventricle has low normal function. The left ventricle has no regional wall motion abnormalities. The left ventricular internal cavity size was normal in size. There is moderate concentric left ventricular hypertrophy. Left ventricular diastolic parameters are consistent with Grade I diastolic dysfunction (impaired relaxation). Right Ventricle: The right ventricular size is normal. No increase in right ventricular wall thickness. Right ventricular systolic function is normal. Tricuspid regurgitation signal is inadequate for assessing PA pressure. Left Atrium: Left atrial size was mildly dilated. Right Atrium: Right atrial size was mildly dilated. Pericardium: A small pericardial effusion is present. Mitral Valve: The mitral valve is degenerative in appearance. Mild to moderate mitral  annular calcification. Trivial mitral valve regurgitation. No evidence of mitral valve stenosis. Tricuspid Valve: The tricuspid valve is normal in structure. Tricuspid valve regurgitation is trivial. No evidence of tricuspid stenosis. Aortic Valve: The aortic valve is grossly normal. There is mild calcification of the aortic valve. There is mild thickening of the aortic valve. Aortic valve regurgitation is trivial. No aortic stenosis is present. Aortic valve peak gradient measures 5.1  mmHg. Pulmonic Valve: The pulmonic valve was normal in structure. Pulmonic valve regurgitation is trivial. No evidence of pulmonic stenosis. Aorta: The aortic root is normal in size and structure. Venous: The inferior vena cava is normal in size with greater than 50% respiratory variability, suggesting right atrial pressure of 3 mmHg. IAS/Shunts: No atrial level shunt detected by color flow Doppler.  LEFT VENTRICLE PLAX 2D LVIDd:         4.50 cm   Diastology LVIDs:         3.27 cm   LV e' medial:    2.42  cm/s LV PW:         1.40 cm   LV E/e' medial:  40.8 LV IVS:        1.40 cm   LV e' lateral:   3.49 cm/s LVOT diam:     2.00 cm   LV E/e' lateral: 28.3 LV SV:         64 LV SV Index:   37 LVOT Area:     3.14 cm  RIGHT VENTRICLE             IVC RV Basal diam:  3.20 cm     IVC diam: 1.70 cm RV S prime:     17.30 cm/s TAPSE (M-mode): 2.1 cm LEFT ATRIUM             Index        RIGHT ATRIUM           Index LA diam:        3.80 cm 2.22 cm/m   RA Area:     16.60 cm LA Vol (A2C):   66.6 ml 38.95 ml/m  RA Volume:   46.70 ml  27.31 ml/m LA Vol (A4C):   62.7 ml 36.67 ml/m LA Biplane Vol: 67.5 ml 39.47 ml/m  AORTIC VALVE                 PULMONIC VALVE AV Area (Vmax): 3.20 cm     PV Vmax:       1.08 m/s AV Vmax:        113.00 cm/s  PV Peak grad:  4.7 mmHg AV Peak Grad:   5.1 mmHg LVOT Vmax:      115.00 cm/s LVOT Vmean:     63.700 cm/s LVOT VTI:       0.203 m  AORTA Ao Root diam: 3.50 cm Ao Asc diam:  3.50 cm MITRAL VALVE MV Area (PHT):  3.31 cm     SHUNTS MV Decel Time: 229 msec     Systemic VTI:  0.20 m MV E velocity: 98.80 cm/s   Systemic Diam: 2.00 cm MV A velocity: 123.00 cm/s MV E/A ratio:  0.80 Weston Brass MD Electronically signed by Weston Brass MD Signature Date/Time: 05/16/2022/6:14:02 AM    Final    CT ANGIO HEAD NECK W WO CM (CODE STROKE)  Result Date: 05/15/2022 CLINICAL DATA:  Stroke EXAM: CT ANGIOGRAPHY HEAD AND NECK TECHNIQUE: Multidetector CT imaging of the head and neck was performed using the standard protocol during bolus administration of intravenous contrast. Multiplanar CT image reconstructions and MIPs were obtained to evaluate the vascular anatomy. Carotid stenosis measurements (when applicable) are obtained utilizing NASCET criteria, using the distal internal carotid diameter as the denominator. RADIATION DOSE REDUCTION: This exam was performed according to the departmental dose-optimization program which includes automated exposure control, adjustment of the mA and/or kV according to patient size and/or use of iterative reconstruction technique. CONTRAST:  16mL OMNIPAQUE IOHEXOL 350 MG/ML SOLN COMPARISON:  Same-day noncontrast head CT, CTA head and neck 09/14/2019 FINDINGS: CTA NECK FINDINGS Image quality is degraded by motion artifact particularly at the level of the carotid bifurcation on the right. Aortic arch: There is minimal calcified plaque in the aortic arch. The origins of the major branch vessels are patent. The subclavian arteries are patent to the level imaged. Right carotid system: The right common carotid artery is patent. There is bulky mixed plaque at the bifurcation resulting in up to approximately 60% stenosis, though this is suboptimally evaluated due to  motion artifact. The distal right internal carotid artery is patent. The external carotid artery is patent. There is no evidence of dissection or aneurysm. Left carotid system: The left common carotid artery is patent with a patent stent  graft. The internal carotid artery is widely patent the external carotid artery appears occluded at its origin with reconstitution of flow in the distal branches unchanged. There is no evidence of dissection or aneurysm. Vertebral arteries: Vertebral arteries are patent, without hemodynamically significant stenosis or occlusion. There is scattered calcified plaque bilaterally, most notably in the left V3 segment, without greater than mild stenosis. There is no dissection or aneurysm. Skeleton: There is no acute osseous abnormality. There is no visible canal hematoma. There is fluid collection anterior to the left glenohumeral joint which is incompletely imaged. A smaller fluid collection with associated calcified loose bodies are seen on the right. Findings consistent with fluid distention of the subscapularis recesses bilaterally. Other neck: The soft tissues of the neck are unremarkable. Upper chest: The imaged lung apices are clear. Review of the MIP images confirms the above findings CTA HEAD FINDINGS Anterior circulation: Is calcified plaque in the intracranial ICAs resulting in up to moderate stenosis of the bilateral paraclinoid segments. There is unchanged focal severe stenosis of the right M1 segment (11-102, 12-105). The distal right MCA branches are patent. The left MCA is patent without proximal stenosis or occlusion. The bilateral ACAs are patent with focal mild stenosis of the right A2 segment. There is no other significant stenosis or occlusion. The anterior communicating artery is normal. There is no aneurysm or AVM. Posterior circulation: There is multifocal calcified plaque in the bilateral V4 segments resulting in multifocal moderate to severe stenosis, unchanged. The basilar artery is patent with unchanged multifocal irregularity and up to severe stenosis in the midportion (11-117). The major cerebellar arteries appear patent. There is unchanged focal moderate to severe stenosis of the left P1/P2  junction (11-103, 12-115). There is unchanged focal severe stenosis of the right P1 segment (11-101). Left larger than right posterior communicating arteries are identified. Venous sinuses: Patent. Anatomic variants: As above. Review of the MIP images confirms the above findings IMPRESSION: 1. No emergent large vessel occlusion. 2. Mixed plaque at the right carotid bifurcation resulting in approximately 60% stenosis, degraded by motion artifact. 3. Patent left common/internal carotid artery stent, and patent vertebral arteries in the neck. 4. Overall unchanged intracranial atherosclerotic disease resulting in multifocal moderate to severe stenoses in the anterior and posterior circulations as described in detail above. Notably, there is focal severe stenosis in the mid basilar artery which is unchanged. 5. 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 Electronically Signed   By: Lesia Hausen M.D.   On: 05/15/2022 13:11   CT HEAD CODE STROKE WO CONTRAST  Addendum Date: 05/15/2022   ADDENDUM REPORT: 05/15/2022 12:56 ADDENDUM: Upon further review, there is a 12 mm focus of abnormal hypodensity within the right internal capsule/inferior right basal ganglia (series 2, image 15) (series 5, image 34). This is new from the prior head CT of 09/14/2019 and may reflect an acute infarct. Consider a brain MRI for further evaluation. These results were called by telephone at the time of interpretation on 05/15/2022 at 12:50 pm to provider Dr. Wilford Corner, who verbally acknowledged these results. Electronically Signed   By: Jackey Loge D.O.   On: 05/15/2022 12:56   Result Date: 05/15/2022 CLINICAL DATA:  Code stroke. Neuro deficit, acute, stroke suspected.  EXAM: CT HEAD WITHOUT CONTRAST TECHNIQUE: Contiguous axial images were obtained from the base of the skull through the vertex without intravenous contrast. RADIATION DOSE REDUCTION: This exam was performed according  to the departmental dose-optimization program which includes automated exposure control, adjustment of the mA and/or kV according to patient size and/or use of iterative reconstruction technique. COMPARISON:  Brain MRI 09/14/2019. CT angiogram head/neck 09/14/2019. Non-contrast head CT 09/14/2019. FINDINGS: Brain: Mild generalized cerebral atrophy. Redemonstrated chronic lacunar infarct within the right caudate head. Moderate multifocal T2 FLAIR hyperintense signal abnormality within the cerebral white matter, nonspecific but compatible with chronic small-vessel image disease. There is no acute intracranial hemorrhage. No demarcated cortical infarct. No extra-axial fluid collection. No evidence of an intracranial mass. No midline shift. Vascular: No hyperdense vessel. Atherosclerotic calcifications. Skull: No fracture or aggressive osseous lesion. Sinuses/Orbits: No mass or acute finding within the imaged orbits. No significant paranasal sinus disease at the imaged levels. ASPECTS ([place] Stroke Program Early CT Score) - Ganglionic level infarction (caudate, lentiform nuclei, internal capsule, insula, M1-M3 cortex): 7 - Supraganglionic infarction (M4-M6 cortex): 3 Total score (0-10 with 10 being normal): 10 Attempts are being made to reach the ordering provider at this time. IMPRESSION: 1. No evidence of acute intracranial abnormality. 2. Redemonstrated chronic lacunar infarct within the right caudate head. 3. Moderate chronic small vessel ischemic changes within the cerebral white matter. 4. Mild generalized cerebral atrophy. Electronically Signed: By: Jackey Loge D.O. On: 05/15/2022 12:45      HISTORY OF PRESENT ILLNESS  Ms. Marionna Gonia is a 81 y.o. female with history of HLD, HTN, CAD, TIA, stroke, bipolar disorder, PAD and bilateral carotid stenosis s/p stenting of left carotid artery who presents with sudden onset left sided weakness, left facial droop, slurred speech, left sided sensory deficit, left  sided neglect and left upper quandrantopia.  Patient had just finished a water aerobics class and was waiting on a bench when she experienced sudden onset of the above symptoms.  EMS was called, they found her to have flaccid left hemiparesis and she was brought to the ED as a code stroke.  Symptoms improved on the way to the hospital but not completely resolved. She received IV TNK  HOSPITAL COURSE Stroke: R AchA territory infarct including optic radiation and PLIC s/p IV TNK, likely due to right carotid stenosis   Code Stroke CT head 12 mm focus of abnormal hypodensity within the right internal capsule/inferior right basal ganglia. Redemonstrated chronic lacunar infarct within the right caudate head. CTA head & neck Mixed plaque at the right carotid bifurcation resulting in approximately 60% stenosis, degraded by motion artifact. Patent left CCA/ICA stent. unchanged intracranial atherosclerotic disease resulting in multifocal moderate to severe stenoses in the anterior and posterior circulations. Focal severe stenosis in the mid basilar artery which is unchanged. MRI Acute infarct in the right medial temporal lobe and posterior limb of the internal capsule, possibly anterior choroidal. 2D Echo EF 50-55% Carotid Doppler right ICA are consistent with a 60-79% stenosis. Higher velocities may be obscured by severe plaque and acoustic shadowing.   LDL 56 HgbA1c 6.8 VTE prophylaxis - SCD"s aspirin 81 mg daily prior to admission, now on aspirin 325 mg daily and clopidogrel 75 mg daily.  Further regimen per vascular surgery. Therapy recommendations:  Outpt PT/OT Disposition:  pending   Carotid stenosis  S/p left CEA and then stenting 02/2019 with Dr. Randie Heinz CTA head & neck Mixed plaque at the right carotid bifurcation resulting in approximately 60% stenosis  MRI stroke consistent with R AchA territory Carotid Doppler right ICA are consistent with a 60-79% stenosis. Higher velocities may be obscured by  severe plaque and acoustic shadowing. Vascular surgery following, plan for right CEA vs. TCAR 05/20/22   History of stroke/TIA 02/2019 admitted for transient aphasia.  CT no acute abnormality.  MRI negative for stroke.  CTA head and neck showed severe intracranial stenosis including right ICA siphon, bilateral V4, basilar artery, right M1 and M2.  Bilateral ICA 50 to 60% stenosis.  Carotid Doppler showed right ICA 40 to 59% stenosis, left ICA 60 to 79% stenosis.  EF 60 to 60%.  LDL 122, A1c 7.2, UDS negative.  Discharged on DAPT and Zocor 20. Had left CEA 02/2019 with Dr. Randie Heinz.  Complicated by vascular reocclusion, status post left CAS 2 weeks later. 09/2019 admitted for confusion on awakening in the setting of hyponatremia and hypertension.  MRI no acute abnormality.  CT head and neck unchanged severe stenosis right M1, right ICA 54% stenosis, severe mid basilar stenosis unchanged.   Hypertension - Hypotension on presentation Home meds:  hydralazine, losartan BP 80s on ED presentation Now stable on the high end On Zocor 25->50->100 Orthostatic vital sup 184/54, sit 170/66, stand 164/65, after amb to bathroom 198/58 Avoid low BP - BP on 10/9 as high as 194/70 - Pt is on Losartan scheduled with hydralazine and labetalol prn. May need further Rx after carotid intervention planned for 05/20/22 Long-term BP goal 130-150 given severe intracranial and extracranial stenosis   Hyperlipidemia Home meds:  repatha, not resumed in hospital LDL 56, goal < 70  Continue Repatha at discharge   Other Stroke Risk Factors Advanced Age >/= 76  Coronary artery disease status post stenting PAD   DISCHARGE EXAM Blood pressure (!) 137/47, pulse 69, temperature 97.6 F (36.4 C), temperature source Oral, resp. rate 15, height 5\' 3"  (1.6 m), weight 67.1 kg, SpO2 100 %.  General - Well nourished, well developed pleasant elderly lady, in no apparent distress. Cardiovascular - Regular rhythm and rate.   Mental  Status -  Level of arousal and orientation to time, place, and person were intact. Language including expression, naming, repetition, comprehension was assessed and found intact. Attention span and concentration were normal. Recent and remote memory were intact. Fund of Knowledge was assessed and was intact.   Cranial Nerves II - XII - II - Visual field intact OU. III, IV, VI - Extraocular movements intact. V - Facial sensation intact bilaterally. VII - Facial movement intact bilaterally. VIII - Hearing & vestibular intact bilaterally. X - Palate elevates symmetrically. XI - Chin turning & shoulder shrug intact bilaterally. XII - Tongue protrusion intact.   Motor Strength - left arm and leg slightly weaker 4/5  with decreased fine motor skills in left hand. Bulk was normal and fasciculations were absent.   Motor Tone - Muscle tone was assessed at the neck and appendages and was normal.   Sensory - Light touch, temperature/pinprick were assessed and were symmetrical.     Coordination - The patient had normal movements in the hands and feet with no ataxia or dysmetria.  Tremor was absent.   Gait and Station - deferred.   Discharge Diet       Diet   Diet heart healthy/carb modified Room service appropriate? Yes; Fluid consistency: Thin   liquids  DISCHARGE PLAN Disposition:  home  aspirin 325 mg daily and clopidogrel 75 mg daily for secondary stroke prevention x 6 months and then  aspirin alone Ongoing stroke risk factor control by Primary Care Physician at time of discharge Follow-up PCP Andreas Blowerabeza, Yuri M., MD in 2 weeks. Follow-up in [facility] in 8 weeks, office to schedule an appointment.  Follow up with Dr. Randie Heinzain in 4 weeks  [facility]   50 minutes were spent preparing discharge.  Gevena Martenise Wolfe DNP, ACNPC-AG   I have personally obtained history,examined this patient, reviewed notes, independently  viewed imaging studies, participated in medical decision making and plan of care.ROS completed by me personally and pertinent positives fully documented  I have made any additions or clarifications directly to the above note. Agree with note above.    Delia HeadyPramod Jaspreet Bodner, MD Medical Director [facility] Pager: [phone] 05/21/2022 5:26 PM

## 2022-05-21 NOTE — Plan of Care (Signed)
  Problem: Acute Rehab PT Goals(only PT should resolve) Goal: Pt Will Go Supine/Side To Sit Outcome: Adequate for Discharge Goal: Pt Will Go Sit To Supine/Side Outcome: Adequate for Discharge Goal: Patient Will Transfer Sit To/From Stand Outcome: Adequate for Discharge Goal: Pt Will Ambulate Outcome: Adequate for Discharge Goal: Pt Will Go Up/Down Stairs Outcome: Adequate for Discharge   Problem: Acute Rehab OT Goals (only OT should resolve) Goal: Pt. Will Perform Grooming Outcome: Adequate for Discharge Goal: Pt. Will Perform Upper Body Dressing Outcome: Adequate for Discharge Goal: Pt. Will Perform Lower Body Dressing Outcome: Adequate for Discharge Goal: Pt. Will Transfer To Toilet Outcome: Adequate for Discharge Goal: Pt. Will Perform Toileting-Clothing Manipulation Outcome: Adequate for Discharge Goal: OT Additional ADL Goal #1 Outcome: Adequate for Discharge

## 2022-05-21 NOTE — TOC Transition Note (Signed)
Transition of Care (TOC) - CM/SW Discharge Note Marvetta Gibbons RN, BSN Transitions of Care Unit 4E- RN Case Manager See Treatment Team for direct phone #    Patient Details  Name: Margaret Butler MRN: [id number] Date of Birth: [birth year]  Transition of Care [facility] CM/SW Contact:  Dawayne Patricia, RN Phone Number: 05/21/2022, 1:30 PM   Clinical Narrative:    Pt stable for transition home today. Outpt therapy referral has been sent to [facility]- they will contact pt regarding referral and scheduling.   No further TOC needs noted. Pt to return home w/ family. Has RW at home.    Final next level of care: OP Rehab Barriers to Discharge: Barriers Resolved   Patient Goals and CMS Choice   CMS Medicare.gov Compare Post Acute Care list provided to:: Patient Choice offered to / list presented to : Patient  Discharge Placement               Home        Discharge Plan and Services   Discharge Planning Services: CM Consult            DME Arranged: N/A DME Agency: NA       HH Arranged: NA HH Agency: NA        Social Determinants of Health (SDOH) Interventions     Readmission Risk Interventions    05/21/2022    1:30 PM  Readmission Risk Prevention Plan  Transportation Screening Complete  PCP or Specialist Appt within 5-7 Days Complete  Home Care Screening Complete  Medication Review (RN CM) Complete

## 2022-05-21 NOTE — Progress Notes (Addendum)
[facility] [place]  Subjective  - no new complaints    Objective (!) 112/51 60 (!) 97.4 F (36.3 C) (Oral) 15 98%  Intake/Output Summary (Last 24 hours) at 05/21/2022 0850 Last data filed at 05/21/2022 0741 Gross per 24 hour  Intake 1340 ml  Output 470 ml  Net 870 ml     Moving all 4 extremities No neurologic deficits no facial droop or tongue deviation Lungs non labored breathing Left neck incision healing well without hematoma, groin soft  Assessment/Planning: POD # 1 left TCAR for symptomatic ICA stenosis  Neck healing well, no neurologic deficits post surgery Will give her dual antiplatelet ASA and Plavix for maximum medical therapy and stent protection. F/U with out office in 4 weeks with carotid duplex.  Our office will call her with the appointment.  Stable disposition from a vascular stand point. She does not want to take narcotics.  Roxy Horseman 05/21/2022 8:50 AM --  Laboratory Lab Results: Recent Labs    05/21/22 0335  WBC 12.8*  HGB 11.8*  HCT 35.2*  PLT 208   BMET Recent Labs    05/21/22 0335  NA 135  K 4.1  CL 104  CO2 23  GLUCOSE 180*  BUN 16  CREATININE 0.67  CALCIUM 8.6*    COAG Lab Results  Component Value Date   INR 1.0 05/15/2022   INR 1.0 09/14/2019   INR 0.9 07/20/2019   No results found for: "PTT"   I have independently interviewed and examined patient and agree with PA assessment and plan above.   Lakeena Downie C. Donzetta Matters, MD [facility] [place] Office: [phone] Pager: [phone]

## 2022-05-21 NOTE — Progress Notes (Signed)
Occupational Therapy Treatment Patient Details Name: Margaret Butler MRN: [id number] DOB: [birth year] Today's Date: 05/21/2022   History of present illness 80 yo female presented to ED code stroke with significant L sided weakenss, L facial droop slurred speech L sensory deficits and L upper quandrantopia. Imaging (+) acute R temporal lobe and internal capsule infarcts. PMHx significant for HLD, HTN, CAD, Hx of TIA, Hx of CVA, bipolar disorder and PAD.   OT comments  Pt progressing towards goals, completing transfers with min guard using RW, +orthostatic, RN notified, see below. Pt able to perform LUE HEP, administered fine motor coordination handout with pt and reviewed exercises. Pt reporting improvement in visual symptoms, however reports mild blurred vision is still present. Family in room and supportive during session. Pt presenting with impairments listed below, will follow acutely. Continue to recommend OP OT at d/c.  BP seated: 148/54 (82) BP standing: 124/76 (91) BP seated after ambulation in room: 137/47 (73)   Recommendations for follow up therapy are one component of a multi-disciplinary discharge planning process, led by the attending physician.  Recommendations may be updated based on patient status, additional functional criteria and insurance authorization.    Follow Up Recommendations  Outpatient OT    Assistance Recommended at Discharge Frequent or constant Supervision/Assistance  Patient can return home with the following  A little help with walking and/or transfers;A little help with bathing/dressing/bathroom;Assistance with cooking/housework;Assist for transportation   Equipment Recommendations  None recommended by OT    Recommendations for Other Services      Precautions / Restrictions Precautions Precautions: Fall Restrictions Weight Bearing Restrictions: No       Mobility Bed Mobility               General bed mobility comments: up in chair upon  arrival and departure    Transfers Overall transfer level: Needs assistance Equipment used: Rolling walker (2 wheels) Transfers: Sit to/from Stand Sit to Stand: Min guard                 Balance Overall balance assessment: Needs assistance Sitting-balance support: Feet supported, No upper extremity supported Sitting balance-Leahy Scale: Fair     Standing balance support: Bilateral upper extremity supported, During functional activity, Reliant on assistive device for balance Standing balance-Leahy Scale: Poor Standing balance comment: Reliant on BUE support                           ADL either performed or assessed with clinical judgement   ADL Overall ADL's : Needs assistance/impaired                         Toilet Transfer: Min guard;Ambulation;Regular Glass blower/designer Details (indicate cue type and reason): simulated, + orthostatic         Functional mobility during ADLs: Min guard;Rolling walker (2 wheels)      Extremity/Trunk Assessment Upper Extremity Assessment Upper Extremity Assessment: LUE deficits/detail LUE Deficits / Details: 3+/5 LUE at shoulder and digits--arthritic shulder decreased ROM >90*. 4/5 at elbow. LUE Sensation: WNL LUE Coordination: decreased fine motor   Lower Extremity Assessment Lower Extremity Assessment: Defer to PT evaluation        Vision   Vision Assessment?: Vision impaired- to be further tested in functional context Additional Comments: mild blurred vision, but improved from evaluation per pt   Perception Perception Perception: Not tested   Praxis Praxis Praxis: Not tested    Cognition  Arousal/Alertness: Awake/alert Behavior During Therapy: WFL for tasks assessed/performed Overall Cognitive Status: Within Functional Limits for tasks assessed                                          Exercises Exercises: Other exercises Shoulder Exercises Shoulder Flexion: AROM, 10  reps, Left Shoulder ABduction: AROM, 10 reps, Left Shoulder External Rotation: AROM, 10 reps, Left Elbow Flexion: AROM, 10 reps, Left Elbow Extension: AROM, 10 reps, Left Other Exercises Other Exercises: composite flex/ext LUE x10 Other Exercises: digit opposition LUE x10 Other Exercises: prone isolated digit extension LUE x10 Other Exercises: L thumb circles x10    Shoulder Instructions       General Comments + orthostatic (see note)    Pertinent Vitals/ Pain       Pain Assessment Pain Assessment: No/denies pain  Home Living                                          Prior Functioning/Environment              Frequency  Min 2X/week        Progress Toward Goals  OT Goals(current goals can now be found in the care plan section)  Progress towards OT goals: Progressing toward goals  Acute Rehab OT Goals Patient Stated Goal: none stated OT Goal Formulation: With patient Time For Goal Achievement: 05/31/22 Potential to Achieve Goals: Good ADL Goals Pt Will Perform Grooming: with modified independence;sitting Pt Will Perform Upper Body Dressing: with modified independence;sitting Pt Will Perform Lower Body Dressing: with modified independence;sit to/from stand Pt Will Transfer to Toilet: with modified independence;ambulating;regular height toilet Pt Will Perform Toileting - Clothing Manipulation and hygiene: with modified independence;sit to/from stand Additional ADL Goal #1: Patient will complete LUE HEP with supervision A and written handout to improve [place] in prep for ADLs.  Plan Discharge plan remains appropriate;Frequency remains appropriate    Co-evaluation                 AM-PAC OT "6 Clicks" Daily Activity     Outcome Measure   Help from another person eating meals?: None Help from another person taking care of personal grooming?: A Little Help from another person toileting, which includes using toliet, bedpan, or urinal?: A  Little Help from another person bathing (including washing, rinsing, drying)?: A Little Help from another person to put on and taking off regular upper body clothing?: A Little Help from another person to put on and taking off regular lower body clothing?: A Little 6 Click Score: 19    End of Session Equipment Utilized During Treatment: Gait belt;Rolling walker (2 wheels)  OT Visit Diagnosis: Unsteadiness on feet (R26.81);Repeated falls (R29.6);Muscle weakness (generalized) (M62.81);Hemiplegia and hemiparesis Hemiplegia - Right/Left: Left Hemiplegia - dominant/non-dominant: Non-Dominant Hemiplegia - caused by: Cerebral infarction   Activity Tolerance Patient tolerated treatment well   Patient Left in chair;with call bell/phone within reach;with family/visitor present   Nurse Communication Mobility status        Time: [id number] OT Time Calculation (min): 31 min  Charges: OT General Charges $OT Visit: 1 Visit OT Treatments $Therapeutic Activity: 8-22 mins $Therapeutic Exercise: 8-22 mins  Lynnda Child, OTD, OTR/L Acute Rehab [phone] - [place] 05/21/2022, 4:04 PM

## 2022-05-21 NOTE — Plan of Care (Signed)
  Problem: Health Behavior/Discharge Planning: Goal: Ability to manage health-related needs will improve 05/21/2022 0427 by Peggye Pitt, RN Outcome: Progressing 05/20/2022 2324 by Peggye Pitt, RN Outcome: Progressing   Problem: Health Behavior/Discharge Planning: Goal: Ability to manage health-related needs will improve Outcome: Progressing   Problem: Clinical Measurements: Goal: Respiratory complications will improve Outcome: Progressing Goal: Cardiovascular complication will be avoided Outcome: Progressing   Problem: Activity: Goal: Risk for activity intolerance will decrease Outcome: Progressing

## 2022-05-21 NOTE — Progress Notes (Signed)
STROKE TEAM PROGRESS NOTE   INTERVAL HISTORY Patient had elective TCAR procedure done yesterday.  Patient has done well postprocedure.  She has been able to ambulate and is hemodynamically stable.  She has no complaints.  Vascular surgery has seen her and cleared her to go home.  Vital signs stable.  Neurological exam unchanged Vitals:   05/21/22 0056 05/21/22 0100 05/21/22 0331 05/21/22 0829  BP: (!) 133/39 (!) 133/39 (!) 121/46 (!) 112/51  Pulse: (!) 55 (!) 55 62 60  Resp: 13 13 18 15   Temp:   98.1 F (36.7 C) (!) 97.4 F (36.3 C)  TempSrc:   Axillary Oral  SpO2: 96% 96% 96% 98%  Weight:      Height:       CBC:  Recent Labs  Lab 05/15/22 1227 05/15/22 1244 05/17/22 0342 05/21/22 0335  WBC 12.0*  --  8.1 12.8*  NEUTROABS 9.0*  --   --   --   HGB 13.2   < > 12.2 11.8*  HCT 41.0   < > 37.5 35.2*  MCV 86.3  --  84.5 83.8  PLT 242  --  226 208   < > = values in this interval not displayed.    Basic Metabolic Panel:  Recent Labs  Lab 05/17/22 0342 05/21/22 0335  NA 139 135  K 3.7 4.1  CL 109 104  CO2 22 23  GLUCOSE 149* 180*  BUN 12 16  CREATININE 0.56 0.67  CALCIUM 8.9 8.6*    Lipid Panel:  Recent Labs  Lab 05/21/22 0335  CHOL 145  TRIG 158*  HDL 48  CHOLHDL 3.0  VLDL 32  LDLCALC 65    HgbA1c:  Recent Labs  Lab 05/15/22 1227  HGBA1C 6.8*    Urine Drug Screen:  Recent Labs  Lab 05/15/22 1224  LABOPIA NONE DETECTED  COCAINSCRNUR NONE DETECTED  LABBENZ NONE DETECTED  AMPHETMU NONE DETECTED  THCU NONE DETECTED  LABBARB NONE DETECTED     Alcohol Level  Recent Labs  Lab 05/15/22 1246  ETH <10     IMAGING past 24 hours DG C-Arm 1-60 Min-No Report  Result Date: 05/20/2022 Fluoroscopy was utilized by the requesting physician.  No radiographic interpretation.    PHYSICAL EXAM  Temp:  [97.3 F (36.3 C)-99.5 F (37.5 C)] 97.4 F (36.3 C) (10/11 0829) Pulse Rate:  [51-75] 60 (10/11 0829) Resp:  [10-24] 15 (10/11 0829) BP:  (112-173)/(39-61) 112/51 (10/11 0829) SpO2:  [92 %-98 %] 98 % (10/11 0829) Arterial Line BP: (90-177)/(20-63) 90/20 (10/10 1919) Weight:  [67.1 kg] 67.1 kg (10/10 1112)  General - Well nourished, well developed pleasant elderly lady, in no apparent distress. Cardiovascular - Regular rhythm and rate.  Mental Status -  Level of arousal and orientation to time, place, and person were intact. Language including expression, naming, repetition, comprehension was assessed and found intact. Attention span and concentration were normal. Recent and remote memory were intact. Fund of Knowledge was assessed and was intact.  Cranial Nerves II - XII - II - Visual field intact OU. III, IV, VI - Extraocular movements intact. V - Facial sensation intact bilaterally. VII - Facial movement intact bilaterally. VIII - Hearing & vestibular intact bilaterally. X - Palate elevates symmetrically. XI - Chin turning & shoulder shrug intact bilaterally. XII - Tongue protrusion intact.  Motor Strength - left arm and leg slightly weaker 4/5  with decreased fine motor skills in left hand. Bulk was normal and fasciculations were absent.  Motor Tone - Muscle tone was assessed at the neck and appendages and was normal.  Sensory - Light touch, temperature/pinprick were assessed and were symmetrical.    Coordination - The patient had normal movements in the hands and feet with no ataxia or dysmetria.  Tremor was absent.  Gait and Station - deferred.   ASSESSMENT/PLAN [name] is a 81 y.o. female with history of HLD, HTN, CAD, TIA, stroke, bipolar disorder, PAD and bilateral carotid stenosis s/p stenting of left carotid artery who presents with sudden onset left sided weakness, left facial droop, slurred speech, left sided sensory deficit, left sided neglect and left upper quandrantopia.  Patient had just finished a water aerobics class and was waiting on a bench when she experienced sudden onset of the  above symptoms.  EMS was called, they found her to have flaccid left hemiparesis and she was brought to the ED as a code stroke.  Symptoms improved on the way to the hospital but not completely resolved. She received IV TNK.   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 Redemonstrated chronic lacunar infarct within the right caudate head. CTA head & neck Mixed plaque at the right carotid bifurcation resulting in approximately 60% stenosis, degraded by motion artifact. Patent left CCA/ICA stent. unchanged intracranial atherosclerotic disease resulting in multifocal moderate to severe stenoses in the anterior and posterior circulations. Focal severe stenosis in the mid basilar artery which is unchanged. MRI Acute infarct in the right medial temporal lobe and posterior limb of the internal capsule, possibly anterior choroidal. 2D Echo EF 50-55% Carotid Doppler right ICA are consistent with a 60-79% stenosis. Higher velocities may be obscured by severe plaque and acoustic shadowing.   LDL 56 HgbA1c 6.8 VTE prophylaxis - SCD"s aspirin 81 mg daily prior to admission, now on aspirin 325 mg daily and clopidogrel 75 mg daily.  Further regimen per vascular surgery. Therapy recommendations:  Outpt PT/OT Disposition:  pending  Carotid stenosis  S/p left CEA and then stenting 02/2019 with Dr. Donzetta Matters CTA head & neck Mixed plaque at the right carotid bifurcation resulting in approximately 60% stenosis MRI stroke consistent with R AchA territory Carotid Doppler right ICA are consistent with a 60-79% stenosis. Higher velocities may be obscured by severe plaque and acoustic shadowing. Vascular surgery following, plan for right CEA vs. TCAR 05/20/22  History of stroke/TIA 02/2019 admitted for transient aphasia.  CT no acute  abnormality.  MRI negative for stroke.  CTA head and neck showed severe intracranial stenosis including right ICA siphon, bilateral V4, basilar artery, right M1 and M2.  Bilateral ICA 50 to 60% stenosis.  Carotid Doppler showed right ICA 40 to 59% stenosis, left ICA 60 to 79% stenosis.  EF 60 to 60%.  LDL 122, A1c 7.2, UDS negative.  Discharged on DAPT and Zocor 20. Had left CEA 02/2019 with Dr. Donzetta Matters.  Complicated by vascular reocclusion, status post left CAS 2 weeks later. 09/2019 admitted for confusion on awakening in the setting of hyponatremia and hypertension.  MRI no acute abnormality.  CT head and neck unchanged severe stenosis right M1, right ICA 54% stenosis, severe mid basilar stenosis unchanged.  Hypertension - Hypotension on presentation Home meds:  hydralazine, losartan BP 80s on ED presentation Now stable on the high end On Zocor 25->50->100 Orthostatic vital sup  184/54, sit 170/66, stand 164/65, after amb to bathroom 198/58 Avoid low BP - BP on 10/9 as high as 194/70 - Pt is on Losartan scheduled with hydralazine and labetalol prn. May need further Rx after carotid intervention planned for 05/20/22 Long-term BP goal 130-150 given severe intracranial and extracranial stenosis  Hyperlipidemia Home meds:  repatha, not resumed in hospital LDL 56, goal < 70  Continue Repatha at discharge  Other Stroke Risk Factors Advanced Age >/= 16  Coronary artery disease status post stenting PAD  Other Active Problems Bipolar disorder -on lamotrigine Leukocytosis, improved WBC 12.0-8.0 Elevated glucose levels 130's - 140's. HgbA1c 6.8. (apparently pt has Hx of DM - currently not on medication for DM)  Hospital day # 6  I have personally obtained history,examined this patient, reviewed notes, independently viewed imaging studies, participated in medical decision making and plan of care.ROS completed by me personally and pertinent positives fully documented  I have made any additions or  clarifications directly to the above note. Agree with note above.  Continue aspirin and Plavix following her TCAR procedure yesterday for 3 to 6 months and then aspirin alone.  Aggressive risk factor modification.  Discharged home and follow-up as an outpatient with stroke clinic in 2 months.  Long discussion patient and family at the bedside and answered questions.  Delia Heady, MD Medical Director [facility] Pager: [phone] 05/21/2022 3:24 PM

## 2022-05-21 NOTE — Progress Notes (Signed)
STROKE TEAM PROGRESS NOTE   INTERVAL HISTORY Attempted to see patient multiple times during AM and afternoon rounds but patient off the floor for TCAR procedure and hence could not be seen  Vitals:   05/21/22 0331 05/21/22 0829 05/21/22 1132 05/21/22 1341  BP: (!) 121/46 (!) 112/51 (!) 125/43 (!) 137/47  Pulse: 62 60 69   Resp: 18 15 19 15   Temp: 98.1 F (36.7 C) (!) 97.4 F (36.3 C) 97.6 F (36.4 C)   TempSrc: Axillary Oral Oral   SpO2: 96% 98% 100%   Weight:      Height:       CBC:  Recent Labs  Lab 05/15/22 1227 05/15/22 1244 05/17/22 0342 05/21/22 0335  WBC 12.0*  --  8.1 12.8*  NEUTROABS 9.0*  --   --   --   HGB 13.2   < > 12.2 11.8*  HCT 41.0   < > 37.5 35.2*  MCV 86.3  --  84.5 83.8  PLT 242  --  226 208   < > = values in this interval not displayed.   Basic Metabolic Panel:  Recent Labs  Lab 05/17/22 0342 05/21/22 0335  NA 139 135  K 3.7 4.1  CL 109 104  CO2 22 23  GLUCOSE 149* 180*  BUN 12 16  CREATININE 0.56 0.67  CALCIUM 8.9 8.6*   Lipid Panel:  Recent Labs  Lab 05/21/22 0335  CHOL 145  TRIG 158*  HDL 48  CHOLHDL 3.0  VLDL 32  LDLCALC 65   HgbA1c:  Recent Labs  Lab 05/15/22 1227  HGBA1C 6.8*   Urine Drug Screen:  Recent Labs  Lab 05/15/22 1224  LABOPIA NONE DETECTED  COCAINSCRNUR NONE DETECTED  LABBENZ NONE DETECTED  AMPHETMU NONE DETECTED  THCU NONE DETECTED  LABBARB NONE DETECTED    Alcohol Level  Recent Labs  Lab 05/15/22 1246  ETH <10    IMAGING past 24 hours No results found.  PHYSICAL EXAM  Temp:  [97.3 F (36.3 C)-98.1 F (36.7 C)] 97.6 F (36.4 C) (10/11 1132) Pulse Rate:  [51-71] 69 (10/11 1132) Resp:  [11-24] 15 (10/11 1341) BP: (112-149)/(39-61) 137/47 (10/11 1341) SpO2:  [94 %-100 %] 100 % (10/11 1132) Arterial Line BP: (90-158)/(20-54) 90/20 (10/10 1919)  General - Well nourished, well developed pleasant elderly lady, in no apparent distress. Cardiovascular - Regular rhythm and rate.  Mental  Status -  Level of arousal and orientation to time, place, and person were intact. Language including expression, naming, repetition, comprehension was assessed and found intact. Attention span and concentration were normal. Recent and remote memory were intact. Fund of Knowledge was assessed and was intact.  Cranial Nerves II - XII - II - Visual field intact OU. III, IV, VI - Extraocular movements intact. V - Facial sensation intact bilaterally. VII - Facial movement intact bilaterally. VIII - Hearing & vestibular intact bilaterally. X - Palate elevates symmetrically. XI - Chin turning & shoulder shrug intact bilaterally. XII - Tongue protrusion intact.  Motor Strength - left arm and leg slightly weaker 4/5  with decreased fine motor skills in left hand. Bulk was normal and fasciculations were absent.   Motor Tone - Muscle tone was assessed at the neck and appendages and was normal.  Sensory - Light touch, temperature/pinprick were assessed and were symmetrical.    Coordination - The patient had normal movements in the hands and feet with no ataxia or dysmetria.  Tremor was absent.  Gait and Station -  deferred.   ASSESSMENT/PLAN [name] is a 81 y.o. female with history of HLD, HTN, CAD, TIA, stroke, bipolar disorder, PAD and bilateral carotid stenosis s/p stenting of left carotid artery who presents with sudden onset left sided weakness, left facial droop, slurred speech, left sided sensory deficit, left sided neglect and left upper quandrantopia.  Patient had just finished a water aerobics class and was waiting on a bench when she experienced sudden onset of the above symptoms.  EMS was called, they found her to have flaccid left hemiparesis and she was brought to the ED as a code stroke.  Symptoms improved on the way to the hospital but not completely resolved. She received IV TNK.   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 Redemonstrated chronic lacunar infarct within the right caudate head. CTA head & neck Mixed plaque at the right carotid bifurcation resulting in approximately 60% stenosis, degraded by motion artifact. Patent left CCA/ICA stent. unchanged intracranial atherosclerotic disease resulting in multifocal moderate to severe stenoses in the anterior and posterior circulations. Focal severe stenosis in the mid basilar artery which is unchanged. MRI Acute infarct in the right medial temporal lobe and posterior limb of the internal capsule, possibly anterior choroidal. 2D Echo EF 50-55% Carotid Doppler right ICA are consistent with a 60-79% stenosis. Higher velocities may be obscured by severe plaque and acoustic shadowing.   LDL 56 HgbA1c 6.8 VTE prophylaxis - SCD"s aspirin 81 mg daily prior to admission, now on aspirin 325 mg daily and clopidogrel 75 mg daily.  Further regimen per vascular surgery. Therapy recommendations:  Outpt PT/OT Disposition:  pending  Carotid stenosis  S/p left CEA and then stenting 02/2019 with Dr. Donzetta Matters CTA head & neck Mixed plaque at the right carotid bifurcation resulting in approximately 60% stenosis MRI stroke consistent with R AchA territory Carotid Doppler right ICA are consistent with a 60-79% stenosis. Higher velocities may be obscured by severe plaque and acoustic shadowing. Vascular surgery following, plan for right CEA vs. TCAR next Tuesday  History of stroke/TIA 02/2019 admitted for transient aphasia.  CT no acute abnormality.  MRI negative for stroke.  CTA head and neck showed severe intracranial stenosis including right ICA siphon, bilateral V4, basilar artery, right M1 and M2.  Bilateral ICA 50 to 60% stenosis.  Carotid Doppler showed right ICA 40 to 59% stenosis, left ICA 60 to 79% stenosis.  EF 60 to 60%.  LDL 122, A1c  7.2, UDS negative.  Discharged on DAPT and Zocor 20. Had left CEA 02/2019 with Dr. Donzetta Matters.  Complicated by vascular reocclusion, status post left CAS 2 weeks later. 09/2019 admitted for confusion on awakening in the setting of hyponatremia and hypertension.  MRI no acute abnormality.  CT head and neck unchanged severe stenosis right M1, right ICA 54% stenosis, severe mid basilar stenosis unchanged.  Hypertension - Hypotension on presentation Home meds:  hydralazine, losartan BP 80s on ED presentation Now stable on the high end On Zocor 25->50->100 Orthostatic vital sup 184/54, sit 170/66, stand 164/65, after amb to bathroom 198/58 Avoid low BP - BP on 10/9 as high as 194/70 - Pt is on Losartan scheduled with hydralazine and labetalol prn. May need further Rx after carotid intervention planned for Tuesday (tomorrow) Long-term BP goal 130-150 given severe intracranial and extracranial stenosis  Hyperlipidemia Home meds:  repatha, not resumed in hospital LDL 56, goal < 70  Continue Repatha at discharge  Other Stroke Risk Factors Advanced Age >/= 42  Coronary artery disease status post stenting PAD  Other Active Problems Bipolar disorder -on lamotrigine Leukocytosis, improved WBC 12.0-8.0 Elevated glucose levels 130's - 140's. HgbA1c 6.8. (apparently pt has Hx of DM - currently not on medication for DM)  Hospital day # 6  Patient could not be seen as she was off the floor all morning and afternoon for TCAR procedure  Delia Heady, MD

## 2022-05-22 ENCOUNTER — Telehealth: Payer: Self-pay | Admitting: Physician Assistant

## 2022-05-22 NOTE — Telephone Encounter (Signed)
-----   Message from Gabriel Earing, [place] sent at 05/20/2022  3:07 PM EDT ----- S/p right TCAR 10/10.  F/u in 4 weeks with carotid duplex on [facility] clinic day.  Thanks

## 2022-05-24 ENCOUNTER — Emergency Department (HOSPITAL_BASED_OUTPATIENT_CLINIC_OR_DEPARTMENT_OTHER): Payer: Medicare PPO

## 2022-05-24 ENCOUNTER — Encounter (HOSPITAL_BASED_OUTPATIENT_CLINIC_OR_DEPARTMENT_OTHER): Payer: Self-pay | Admitting: Emergency Medicine

## 2022-05-24 ENCOUNTER — Emergency Department (HOSPITAL_BASED_OUTPATIENT_CLINIC_OR_DEPARTMENT_OTHER)
Admission: EM | Admit: 2022-05-24 | Discharge: 2022-05-24 | Disposition: A | Discharge status: Home / Self Care | Payer: Medicare PPO | Attending: Emergency Medicine | Admitting: Emergency Medicine

## 2022-05-24 ENCOUNTER — Other Ambulatory Visit: Payer: Self-pay

## 2022-05-24 DIAGNOSIS — R29810 Facial weakness: Secondary | ICD-10-CM | POA: Insufficient documentation

## 2022-05-24 DIAGNOSIS — I2510 Atherosclerotic heart disease of native coronary artery without angina pectoris: Secondary | ICD-10-CM | POA: Insufficient documentation

## 2022-05-24 DIAGNOSIS — I10 Essential (primary) hypertension: Secondary | ICD-10-CM | POA: Insufficient documentation

## 2022-05-24 DIAGNOSIS — Z7982 Long term (current) use of aspirin: Secondary | ICD-10-CM | POA: Insufficient documentation

## 2022-05-24 DIAGNOSIS — R531 Weakness: Secondary | ICD-10-CM | POA: Insufficient documentation

## 2022-05-24 DIAGNOSIS — E119 Type 2 diabetes mellitus without complications: Secondary | ICD-10-CM | POA: Insufficient documentation

## 2022-05-24 DIAGNOSIS — Z8673 Personal history of transient ischemic attack (TIA), and cerebral infarction without residual deficits: Secondary | ICD-10-CM | POA: Insufficient documentation

## 2022-05-24 LAB — TROPONIN I (HIGH SENSITIVITY): Troponin I (High Sensitivity): 9 ng/L (ref <18)

## 2022-05-24 LAB — CBC
HCT: 38.2 % (ref 36.0–46.0)
Hemoglobin: 12.5 g/dL (ref 12.0–15.0)
MCH: 27.4 pg (ref 26.0–34.0)
MCHC: 32.7 g/dL (ref 30.0–36.0)
MCV: 83.8 fL (ref 80.0–100.0)
Platelets: 219 10*3/uL (ref 150–400)
RBC: 4.56 MIL/uL (ref 3.87–5.11)
RDW: 14 % (ref 11.5–15.5)
WBC: 8.6 10*3/uL (ref 4.0–10.5)
nRBC: 0 % (ref 0.0–0.2)

## 2022-05-24 LAB — URINALYSIS, ROUTINE W REFLEX MICROSCOPIC
Bilirubin Urine: NEGATIVE
Glucose, UA: NEGATIVE mg/dL
Hgb urine dipstick: NEGATIVE
Ketones, ur: NEGATIVE mg/dL
Leukocytes,Ua: NEGATIVE
Nitrite: NEGATIVE
Protein, ur: NEGATIVE mg/dL
Specific Gravity, Urine: <=1.005 (ref 1.005–1.030)
pH: 6 (ref 5.0–8.0)

## 2022-05-24 LAB — BASIC METABOLIC PANEL
Anion gap: 8 (ref 5–15)
BUN: 12 mg/dL (ref 8–23)
CO2: 24 mmol/L (ref 22–32)
Calcium: 9 mg/dL (ref 8.9–10.3)
Chloride: 105 mmol/L (ref 98–111)
Creatinine, Ser: 0.6 mg/dL (ref 0.44–1.00)
GFR, Estimated: >60 mL/min (ref >60)
Glucose, Bld: 124 mg/dL — ABNORMAL HIGH (ref 70–99)
Potassium: 4 mmol/L (ref 3.5–5.1)
Sodium: 137 mmol/L (ref 135–145)

## 2022-05-24 MED ORDER — METOPROLOL TARTRATE 5 MG/5ML IV SOLN
5.0000 mg | Freq: Once | INTRAVENOUS | Status: DC
  Filled 2022-05-24: qty 5

## 2022-05-24 MED ORDER — METOPROLOL TARTRATE 5 MG/5ML IV SOLN
5.0000 mg | Freq: Once | INTRAVENOUS | Status: AC
  Administered 2022-05-24: 5 mg via INTRAVENOUS
  Filled 2022-05-24: qty 5

## 2022-05-24 MED ORDER — METOPROLOL SUCCINATE ER 25 MG PO TB24: ORAL_TABLET | ORAL | 0 refills | Status: AC

## 2022-05-24 MED ORDER — METOPROLOL TARTRATE 25 MG PO TABS
25.0000 mg | ORAL_TABLET | Freq: Once | ORAL | Status: AC
  Administered 2022-05-24: 25 mg via ORAL
  Filled 2022-05-24: qty 1

## 2022-05-24 MED ORDER — HYDRALAZINE HCL 20 MG/ML IJ SOLN
20.0000 mg | Freq: Once | INTRAMUSCULAR | Status: AC
  Administered 2022-05-24: 20 mg via INTRAVENOUS
  Filled 2022-05-24: qty 1

## 2022-05-24 NOTE — ED Notes (Signed)
2 unsuccessful IV attempts. PT tolerated well

## 2022-05-24 NOTE — ED Triage Notes (Signed)
Hypertension per pt and spouse x 1 day , Hx stroke last week and stent placed on right carotid artery on 05/22/2022. Denies any symptoms .

## 2022-05-24 NOTE — ED Notes (Signed)
Multiple attempts to obtain IV access by different staff , difficult stick. Provider made aware

## 2022-05-24 NOTE — ED Provider Notes (Signed)
[facility] EMERGENCY DEPARTMENT Provider Note   CSN: [id number] Arrival date & time: 05/24/22  1507     History  Chief Complaint  Patient presents with   Hypertension    Margaret Butler is a 81 y.o. female.  Pt is a 81 yo female with a pmhx significant for HTN, HLD, L carotid artery stenosis, DM2, CAD, GERD, depression, anemia, and recent stroke.  Pt was admitted from 10/5 to 10/11.  She presented with a flaccid left side and received IV TNK in the ED.  She was found to have right ICA stenosis, so Dr. Donzetta Matters performed a right transcarotid artery stent procedure.  Pt has done well and sx have nearly resolved.  She is able to walk with her walker.  She was d/c with hydralazine and losartan for HTN which is what she was taking prior to the CVA.  It looks like she required prn labetalol while in the hospital, but was not sent home with this.  Long term bp goal is 130-150.  She said her bp has been elevated today.  She feels fine.                   Home Medications Prior to Admission medications   Medication Sig Start Date End Date Taking? Authorizing Provider  metoprolol succinate (TOPROL-XL) 25 MG 24 hr tablet Take 1 tablet daily for a systolic blood pressure over 170. 05/24/22  Yes Isla Pence, MD  aspirin EC 325 MG tablet Take 1 tablet (325 mg total) by mouth daily. 05/22/22   August Albino, NP  Cholecalciferol (VITAMIN D) 50 MCG (2000 UT) tablet Take 2,000 Units by mouth daily.    [provider]  clopidogrel (PLAVIX) 75 MG tablet Take 1 tablet (75 mg total) by mouth daily. 05/21/22   Ulyses Amor, PA-C  escitalopram (LEXAPRO) 10 MG tablet Take 1 tablet (10 mg total) by mouth daily. 05/22/22   August Albino, NP  hydrALAZINE (APRESOLINE) 50 MG tablet Take 50 mg by mouth 2 (two) times daily. 09/09/19   [provider]  lamoTRIgine (LAMICTAL) 25 MG tablet Take 25 mg by mouth daily. 05/07/22   [provider]  losartan (COZAAR) 100  MG tablet Take 100 mg by mouth daily. 05/01/22   [provider]  REPATHA SURECLICK 073 MG/ML SOAJ Inject 140 mg into the skin See admin instructions. Every 2 weeks on the 1st and 15th of each month 02/12/22   [provider]      Allergies    Lipitor [atorvastatin], Zestril [lisinopril], and Zoloft [sertraline]    Review of Systems   Review of Systems  All other systems reviewed and are negative.   Physical Exam Updated Vital Signs BP (!) 210/70   Pulse 60   Temp 98.1 F (36.7 C)   Resp 16   SpO2 96%  Physical Exam Vitals and nursing note reviewed.  Constitutional:      Appearance: Normal appearance.  HENT:     Head: Normocephalic and atraumatic.     Right Ear: External ear normal.     Left Ear: External ear normal.     Nose: Nose normal.     Mouth/Throat:     Mouth: Mucous membranes are moist.     Pharynx: Oropharynx is clear.  Eyes:     Extraocular Movements: Extraocular movements intact.     Conjunctiva/sclera: Conjunctivae normal.     Pupils: Pupils are equal, round, and reactive to light.  Cardiovascular:  Rate and Rhythm: Normal rate and regular rhythm.     Pulses: Normal pulses.     Heart sounds: Normal heart sounds.  Pulmonary:     Effort: Pulmonary effort is normal.     Breath sounds: Normal breath sounds.  Abdominal:     General: Abdomen is flat. Bowel sounds are normal.     Palpations: Abdomen is soft.  Musculoskeletal:        General: Normal range of motion.     Cervical back: Normal range of motion and neck supple.  Skin:    General: Skin is warm.     Capillary Refill: Capillary refill takes less than 2 seconds.  Neurological:     Mental Status: She is alert and oriented to person, place, and time.     Comments: Very slight left facial droop.  Slight weakness left arm and leg.  Psychiatric:        Mood and Affect: Mood normal.        Behavior: Behavior normal.     ED Results / Procedures / Treatments   Labs (all labs  ordered are listed, but only abnormal results are displayed) Labs Reviewed  BASIC METABOLIC PANEL - Abnormal; Notable for the following components:      Result Value   Glucose, Bld 124 (*)    All other components within normal limits  URINALYSIS, ROUTINE W REFLEX MICROSCOPIC - Abnormal; Notable for the following components:   Color, Urine STRAW (*)    All other components within normal limits  CBC  TROPONIN I (HIGH SENSITIVITY)  TROPONIN I (HIGH SENSITIVITY)    EKG EKG Interpretation  Date/Time:  Saturday May 24 2022 15:23:33 EDT Ventricular Rate:  67 PR Interval:  157 QRS Duration: 102 QT Interval:  409 QTC Calculation: 432 R Axis:   41 Text Interpretation: Sinus rhythm Probable LVH with secondary repol abnrm st changes laterally Confirmed by Isla Pence [id number]) on 05/24/2022 3:44:44 PM  Radiology DG Chest Port 1 View  Result Date: 05/24/2022 CLINICAL DATA:  Hypertension. EXAM: PORTABLE CHEST 1 VIEW COMPARISON:  Chest x-ray 09/15/2019 FINDINGS: The heart is enlarged. The lungs are clear. There is no pleural effusion or pneumothorax. No acute fractures are seen. There are degenerative changes of both shoulders. IMPRESSION: Cardiomegaly. No acute pulmonary process. Electronically Signed   By: Ronney Asters M.D.   On: 05/24/2022 16:07    Procedures Procedures    Medications Ordered in ED Medications  metoprolol tartrate (LOPRESSOR) tablet 25 mg (25 mg Oral Given 05/24/22 1622)  metoprolol tartrate (LOPRESSOR) injection 5 mg (5 mg Intravenous Given 05/24/22 1709)  hydrALAZINE (APRESOLINE) injection 20 mg (20 mg Intravenous Given 05/24/22 1744)    ED Course/ Medical Decision Making/ A&P                           Medical Decision Making Amount and/or Complexity of Data Reviewed Labs: ordered. Radiology: ordered.  Risk Prescription drug management.   This patient presents to the ED for concern of htn, this involves an extensive number of treatment options, and  is a complaint that carries with it a high risk of complications and morbidity.  The differential diagnosis includes htn, cva, cad, electrolyte abn   Co morbidities that complicate the patient evaluation  HTN, HLD, L carotid artery stenosis, DM2, CAD, GERD, depression, anemia, and recent stroke   Additional history obtained:  Additional history obtained from epic chart review External records from outside source obtained  and reviewed including husband   Lab Tests:  I Ordered, and personally interpreted labs.  The pertinent results include:  bmp nl, cbc nl   Imaging Studies ordered:  I ordered imaging studies including CXR  I independently visualized and interpreted imaging which showed  IMPRESSION:  Cardiomegaly. No acute pulmonary process.   I agree with the radiologist interpretation   Cardiac Monitoring:  The patient was maintained on a cardiac monitor.  I personally viewed and interpreted the cardiac monitored which showed an underlying rhythm of: nsr   Medicines ordered and prescription drug management:  I ordered medication including lopressor  for htn  Reevaluation of the patient after these medicines showed that the patient improved I have reviewed the patients home medicines and have made adjustments as needed   Test Considered:  Ct, but pt denies h/a   Critical Interventions:  Bp control    Problem List / ED Course:  HTN:  pt's SBP over 200.  She is given lopressor orally, lopressor iv and hydralazine.  Bp is now in the 160s.  Pt is stable for d/c.  She is given lopressor to take prn.  She is stable for d/c.  Return if worse.    Reevaluation:  After the interventions noted above, I reevaluated the patient and found that they have :improved   Social Determinants of Health:  Lives at home   Dispostion:  After consideration of the diagnostic results and the patients response to treatment, I feel that the patent would benefit from discharge  with outpatient f/u.          Final Clinical Impression(s) / ED Diagnoses Final diagnoses:  Hypertension, unspecified type    Rx / DC Orders ED Discharge Orders          Ordered    metoprolol succinate (TOPROL-XL) 25 MG 24 hr tablet        05/24/22 1849              Isla Pence, MD 05/24/22 1850

## 2022-05-24 NOTE — ED Provider Notes (Signed)
Presents with hypertension, difficulty obtaining IV access, able to obtain IV access under ultrasound 20-gauge in the right AC.  Ultrasound ED Peripheral IV (Provider)  Date/Time: 05/24/2022 4:51 PM  Performed by: Marcello Fennel, PA-C Authorized by: Marcello Fennel, PA-C   Procedure details:    Indications: multiple failed IV attempts     Skin Prep: chlorhexidine gluconate     Location:  Right AC   Angiocath:  18 G   Bedside Ultrasound Guided: Yes     Images: not archived     Patient tolerated procedure without complications: Yes     Dressing applied: Yes       Marcello Fennel, PA-C 05/24/22 1652    Isla Pence, MD 05/24/22 1718

## 2022-05-25 ENCOUNTER — Emergency Department (HOSPITAL_BASED_OUTPATIENT_CLINIC_OR_DEPARTMENT_OTHER)
Admission: EM | Admit: 2022-05-25 | Discharge: 2022-05-25 | Disposition: A | Discharge status: Home / Self Care | Payer: Medicare PPO | Attending: Emergency Medicine | Admitting: Emergency Medicine

## 2022-05-25 ENCOUNTER — Encounter (HOSPITAL_BASED_OUTPATIENT_CLINIC_OR_DEPARTMENT_OTHER): Payer: Self-pay

## 2022-05-25 DIAGNOSIS — R531 Weakness: Secondary | ICD-10-CM | POA: Diagnosis not present

## 2022-05-25 DIAGNOSIS — I1A0 Resistant hypertension: Secondary | ICD-10-CM

## 2022-05-25 DIAGNOSIS — I10 Essential (primary) hypertension: Secondary | ICD-10-CM | POA: Insufficient documentation

## 2022-05-25 DIAGNOSIS — E1165 Type 2 diabetes mellitus with hyperglycemia: Secondary | ICD-10-CM | POA: Insufficient documentation

## 2022-05-25 DIAGNOSIS — Z79899 Other long term (current) drug therapy: Secondary | ICD-10-CM | POA: Diagnosis not present

## 2022-05-25 DIAGNOSIS — Z7982 Long term (current) use of aspirin: Secondary | ICD-10-CM | POA: Diagnosis not present

## 2022-05-25 DIAGNOSIS — Z7902 Long term (current) use of antithrombotics/antiplatelets: Secondary | ICD-10-CM | POA: Diagnosis not present

## 2022-05-25 LAB — CBC WITH DIFFERENTIAL/PLATELET
Abs Immature Granulocytes: 0.05 10*3/uL (ref 0.00–0.07)
Basophils Absolute: 0.1 10*3/uL (ref 0.0–0.1)
Basophils Relative: 1 %
Eosinophils Absolute: 0.1 10*3/uL (ref 0.0–0.5)
Eosinophils Relative: 1 %
HCT: 39.7 % (ref 36.0–46.0)
Hemoglobin: 13 g/dL (ref 12.0–15.0)
Immature Granulocytes: 0 %
Lymphocytes Relative: 15 %
Lymphs Abs: 1.7 10*3/uL (ref 0.7–4.0)
MCH: 27.3 pg (ref 26.0–34.0)
MCHC: 32.7 g/dL (ref 30.0–36.0)
MCV: 83.4 fL (ref 80.0–100.0)
Monocytes Absolute: 0.8 10*3/uL (ref 0.1–1.0)
Monocytes Relative: 7 %
Neutro Abs: 8.6 10*3/uL — ABNORMAL HIGH (ref 1.7–7.7)
Neutrophils Relative %: 76 %
Platelets: 254 10*3/uL (ref 150–400)
RBC: 4.76 MIL/uL (ref 3.87–5.11)
RDW: 14 % (ref 11.5–15.5)
WBC: 11.4 10*3/uL — ABNORMAL HIGH (ref 4.0–10.5)
nRBC: 0 % (ref 0.0–0.2)

## 2022-05-25 LAB — BASIC METABOLIC PANEL
Anion gap: 8 (ref 5–15)
BUN: 16 mg/dL (ref 8–23)
CO2: 24 mmol/L (ref 22–32)
Calcium: 9 mg/dL (ref 8.9–10.3)
Chloride: 105 mmol/L (ref 98–111)
Creatinine, Ser: 0.58 mg/dL (ref 0.44–1.00)
GFR, Estimated: >60 mL/min (ref >60)
Glucose, Bld: 123 mg/dL — ABNORMAL HIGH (ref 70–99)
Potassium: 3.6 mmol/L (ref 3.5–5.1)
Sodium: 137 mmol/L (ref 135–145)

## 2022-05-25 LAB — MAGNESIUM: Magnesium: 2.1 mg/dL (ref 1.7–2.4)

## 2022-05-25 MED ORDER — METOPROLOL TARTRATE 25 MG PO TABS
25.0000 mg | ORAL_TABLET | Freq: Once | ORAL | Status: AC
  Administered 2022-05-25: 25 mg via ORAL
  Filled 2022-05-25: qty 1

## 2022-05-25 MED ORDER — HYDRALAZINE HCL 25 MG PO TABS
50.0000 mg | ORAL_TABLET | Freq: Once | ORAL | Status: AC
  Administered 2022-05-25: 50 mg via ORAL
  Filled 2022-05-25: qty 2

## 2022-05-25 NOTE — ED Provider Notes (Signed)
[facility] EMERGENCY DEPARTMENT Provider Note   CSN: [id number] Arrival date & time: 05/25/22  1341     History {Add pertinent medical, surgical, social history, OB history to HPI:1} Chief Complaint  Patient presents with   Hypertension    Margaret Butler is a 81 y.o. female.  Patient here with concern for for high blood pressure.  Patient with recent admission for stroke did get TNK and also had right carotid procedure done by Dr. Randie Heinz, patient had transcarotid artery revascularization October 10.  Patient discharged home on October 11.  Patient's blood pressure medicines that she was discharged home on hydralazine 50 mg twice daily Cozaar 100 mg once a day.  Patient also taking Plavix.  Patient was seen here yesterday for elevated blood pressures.  Received IV hydralazine and IV metoprolol.  Pressures came down to around 160 and they added on Toprol XL 25 mg that she was to take for blood pressure over 170.  She did take that this morning because her blood pressure was over 170 she took that around 10 this morning.  Blood pressures upon arrival here 191/58 185/61 and then we have most recently had one that was around 200 systolic.  Patient without any symptoms patient has a little bit of blurred vision that has been present since the last hospitalization so it is not new.  No headache no chest pain no strokelike symptoms no difficulty breathing.  Following the last stroke patient's had some visual changes and has some weakness to the left upper extremity.  Patient without any shortness of breath no chest pain no headache.  Past medical history significant for diabetes hypertension CVA.  Patient's primary care doctor is [facility] and she has an appointment with them tomorrow morning.       Home Medications Prior to Admission medications   Medication Sig Start Date End Date Taking? Authorizing Provider  aspirin EC 325 MG tablet Take 1 tablet (325 mg total) by mouth daily.  05/22/22   Mathews Argyle, NP  Cholecalciferol (VITAMIN D) 50 MCG (2000 UT) tablet Take 2,000 Units by mouth daily.    [provider]  clopidogrel (PLAVIX) 75 MG tablet Take 1 tablet (75 mg total) by mouth daily. 05/21/22   Lars Mage, PA-C  escitalopram (LEXAPRO) 10 MG tablet Take 1 tablet (10 mg total) by mouth daily. 05/22/22   Mathews Argyle, NP  hydrALAZINE (APRESOLINE) 50 MG tablet Take 50 mg by mouth 2 (two) times daily. 09/09/19   [provider]  lamoTRIgine (LAMICTAL) 25 MG tablet Take 25 mg by mouth daily. 05/07/22   [provider]  losartan (COZAAR) 100 MG tablet Take 100 mg by mouth daily. 05/01/22   [provider]  metoprolol succinate (TOPROL-XL) 25 MG 24 hr tablet Take 1 tablet daily for a systolic blood pressure over 170. 05/24/22   Jacalyn Lefevre, MD  REPATHA SURECLICK 140 MG/ML SOAJ Inject 140 mg into the skin See admin instructions. Every 2 weeks on the 1st and 15th of each month 02/12/22   [provider]      Allergies    Lipitor [atorvastatin], Zestril [lisinopril], and Zoloft [sertraline]    Review of Systems   Review of Systems  Constitutional:  Negative for chills and fever.  HENT:  Negative for rhinorrhea and sore throat.   Eyes:  Positive for visual disturbance.  Respiratory:  Negative for cough and shortness of breath.   Cardiovascular:  Negative for chest pain and leg swelling.  Gastrointestinal:  Negative for abdominal pain, diarrhea, nausea and vomiting.  Genitourinary:  Negative for dysuria.  Musculoskeletal:  Negative for back pain and neck pain.  Skin:  Negative for rash.  Neurological:  Positive for weakness. Negative for dizziness, light-headedness and headaches.  Hematological:  Does not bruise/bleed easily.  Psychiatric/Behavioral:  Negative for confusion.     Physical Exam Updated Vital Signs BP (!) 224/69 (BP Location: Right Arm)   Pulse 68   Temp 97.9 F (36.6 C) (Oral)   Resp 18   Ht 1.6  m (5\' 3" )   Wt 67.1 kg   SpO2 97%   BMI 26.22 kg/m  Physical Exam Vitals and nursing note reviewed.  Constitutional:      General: She is not in acute distress.    Appearance: Normal appearance. She is well-developed.  HENT:     Head: Normocephalic and atraumatic.     Mouth/Throat:     Mouth: Mucous membranes are moist.  Eyes:     Conjunctiva/sclera: Conjunctivae normal.     Pupils: Pupils are equal, round, and reactive to light.  Neck:     Comments: Patient's surgical wound at the base of the neck on the right side healing well no signs of infection. Cardiovascular:     Rate and Rhythm: Normal rate and regular rhythm.     Heart sounds: No murmur heard. Pulmonary:     Effort: Pulmonary effort is normal. No respiratory distress.     Breath sounds: Normal breath sounds. No wheezing, rhonchi or rales.  Abdominal:     Palpations: Abdomen is soft.     Tenderness: There is no abdominal tenderness.  Musculoskeletal:        General: No swelling.     Cervical back: Normal range of motion and neck supple.  Skin:    General: Skin is warm and dry.     Capillary Refill: Capillary refill takes less than 2 seconds.  Neurological:     General: No focal deficit present.     Mental Status: She is alert and oriented to person, place, and time.     Cranial Nerves: Cranial nerve deficit present.     Motor: Weakness present.  Psychiatric:        Mood and Affect: Mood normal.     ED Results / Procedures / Treatments   Labs (all labs ordered are listed, but only abnormal results are displayed) Labs Reviewed  CBC WITH DIFFERENTIAL/PLATELET - Abnormal; Notable for the following components:      Result Value   WBC 11.4 (*)    Neutro Abs 8.6 (*)    All other components within normal limits  BASIC METABOLIC PANEL - Abnormal; Notable for the following components:   Glucose, Bld 123 (*)    All other components within normal limits  MAGNESIUM    EKG EKG  Interpretation  Date/Time:  Sunday May 25 2022 14:34:34 EDT Ventricular Rate:  61 PR Interval:  154 QRS Duration: 104 QT Interval:  440 QTC Calculation: 442 R Axis:   36 Text Interpretation: Normal sinus rhythm Left ventricular hypertrophy with repolarization abnormality ( Sokolow-Lyon , Cornell product ) Abnormal ECG When compared with ECG of 25-May-2022 14:16, PREVIOUS ECG IS PRESENT No significant change since last tracing Confirmed by Fredia Sorrow [phone]) on 05/25/2022 3:22:57 PM  Radiology DG Chest Port 1 View  Result Date: 05/24/2022 CLINICAL DATA:  Hypertension. EXAM: PORTABLE CHEST 1 VIEW COMPARISON:  Chest x-ray 09/15/2019 FINDINGS: The heart is enlarged. The lungs are  clear. There is no pleural effusion or pneumothorax. No acute fractures are seen. There are degenerative changes of both shoulders. IMPRESSION: Cardiomegaly. No acute pulmonary process. Electronically Signed   By: Darliss Cheney M.D.   On: 05/24/2022 16:07    Procedures Procedures  {Document cardiac monitor, telemetry assessment procedure when appropriate:1}  Medications Ordered in ED Medications - No data to display  ED Course/ Medical Decision Making/ A&P                           Medical Decision Making Amount and/or Complexity of Data Reviewed Labs: ordered.  Risk Prescription drug management.   Patient with no new endorgan symptoms.  Patient's blood pressure is elevated.  Patient will receive her hydralazine 50 mg evening dose now and 25 mg of metoprolol.  Tomorrow we will have her double up on her Toprol XL and take 50 mg in the morning.  Then she has an appointment to follow-up with her primary care doctor.  Patient knows to return for severe headache any new strokelike symptoms chest pain or trouble breathing.   Final Clinical Impression(s) / ED Diagnoses Final diagnoses:  None    Rx / DC Orders ED Discharge Orders     None

## 2022-05-25 NOTE — Discharge Instructions (Signed)
Just for this evening do not take your evening dose of hydralazine because we gave it to you here today.  Starting tomorrow take your metoprolol succinate also known as Toprol XL take 50 mg in the morning that would be 2 tablets.  This is doubling the dose that they started you on yesterday.  Follow-up with your primary care doctor for further adjustment of your blood pressure.  Get seen immediately for severe headache any new strokelike symptoms chest pain or trouble breathing.  Otherwise it is probably going to take at least 2 weeks maybe longer for your primary care doctor to make adjustments to get your blood pressure under control.  Getting it under control gradually is not harmful.

## 2022-05-25 NOTE — ED Triage Notes (Signed)
Patient had carotid surgery last week and dc'd on Wednesday.  Patient reports she is suppose to keep BP down unde 180 but it has been in the 190/200.  Denies pain.  Complains of blurred vision. Denies SOB

## 2022-05-25 NOTE — ED Notes (Signed)
Grabbed pt and family in waiting room, upon meeting in the waiting room, asked if she could be roomed immediately, I said that we would have to get her triaged and would need to see the doctor again.  Pt reiterated that she needed a room immediately, explained that we have a waiting room of 15 people, so we would have to work through the same process as yesterday.  Pt stated that they would go somewhere else, unless they could be seen immediately. I again explained that the nurse would need to triage her again, see a new provider, and work through the process, the same as yesterday's visit.  Pt seemed to understand, but husband seemingly did not understand, he kept questioning why she couldn't get a room immediately.  He couldn't recall yesterday's visit, and stated that they didn't want to wait.

## 2022-05-28 NOTE — Patient Outreach (Signed)
Received a Emmi stroke notification for Ms. Najarro . I have assigned Enzo Montgomery, RN to call for follow up and determine if there are any Case Management needs.    [facility], [place], [place] [facility] [phone]

## 2022-05-29 ENCOUNTER — Other Ambulatory Visit: Payer: Self-pay | Admitting: *Deleted

## 2022-05-29 ENCOUNTER — Telehealth: Payer: Self-pay

## 2022-05-29 DIAGNOSIS — I6529 Occlusion and stenosis of unspecified carotid artery: Secondary | ICD-10-CM

## 2022-05-29 NOTE — Patient Outreach (Signed)
  EMMI Stroke Care Coordination Follow Up  05/29/2022 Name:  Margaret Butler MRN:  [id number] DOB:  01/04/1941  Subjective: Margaret Butler is a 81 y.o. year old female who is a primary care patient of Kristopher Glee., MD    Received notification from [place] that spouse called in with questions and wanting to speak with an RN. Patient currently getting EMMI calls and no recent red flags.     I reached out by phone to follow up on the alert and spoke to Caregiver/spouse. He voices that patient is currently in the shower but doing well. He is concerned about patient having trouble concentrating/focusing at times and still has some blurred vision. Discussed with spouse that these are both normal an common sxs post stroke. Discussed s/s of worsening condition and when to seek medical attention. Patient completed PCP appt earlier this week and MD aware of sxs. Patient also having issues with elevated BP. MD adjusted meds. Spouse voices that since then BP has been better controlled and ranging in the 130s-170s. He has parameters on when to take extra BP med and following orders. States appetite good and limiting slat intake. Patient has been able to walk without walker. Denies any further RN CM needs or concerns a this time.  Care Coordination Interventions Activated:  Yes  Care Coordination Interventions:  Yes, provided   Follow up plan: No further intervention required. Advised caregiver that they would continue to get automated EMMI-Stroke post discharge calls to assess how they are doing following recent hospitalization and will receive a call from a nurse if any of their responses were abnormal.   Encounter Outcome:  Pt. Visit Completed   Enzo Montgomery, RN,BSN,CCM [place] Management Coordinator Direct Phone: [phone] Toll Free: [phone] Fax: [phone]

## 2022-06-18 ENCOUNTER — Ambulatory Visit (HOSPITAL_COMMUNITY)
Admission: RE | Admit: 2022-06-18 | Discharge: 2022-06-18 | Disposition: A | Discharge status: Home / Self Care | Payer: Medicare PPO | Source: Ambulatory Visit | Attending: Vascular Surgery | Admitting: Vascular Surgery

## 2022-06-18 ENCOUNTER — Ambulatory Visit (INDEPENDENT_AMBULATORY_CARE_PROVIDER_SITE_OTHER): Payer: Medicare PPO | Admitting: Physician Assistant

## 2022-06-18 ENCOUNTER — Encounter: Payer: Self-pay | Admitting: Physician Assistant

## 2022-06-18 VITALS — BP 138/65 | HR 93 | Temp 97.9°F | Ht 63.0 in | Wt 142.8 lb

## 2022-06-18 DIAGNOSIS — I6529 Occlusion and stenosis of unspecified carotid artery: Secondary | ICD-10-CM

## 2022-06-18 NOTE — Progress Notes (Unsigned)
POST OPERATIVE OFFICE NOTE    CC:  F/u for surgery  HPI:  This is a 81 y.o. female who is 81 year old female with a history of left carotid endarterectomy followed by left transcarotid artery stenting now with symptomatic right ICA stenosis indicated for right ICA stenting.  She is now s/p Right TCAR.    She developed significant dizziness, unable to walk straight.  24-hour MRI demonstrated acute infarct in the right medial temporal lobe, posterior limb of the internal capsule.   Pt returns today for follow up.  Pt states she is pleased with her outcome.  She is trying to increase her daily activity.  She denies new symptoms of stroke or TIA.     Allergies  Allergen Reactions   Lipitor [Atorvastatin] Other (See Comments)    Muscle pain   Zestril [Lisinopril] Cough        Zoloft [Sertraline] Other (See Comments)    Caused headaches     Current Outpatient Medications  Medication Sig Dispense Refill   aspirin EC 325 MG tablet Take 1 tablet (325 mg total) by mouth daily. 30 tablet 0   Cholecalciferol (VITAMIN D) 50 MCG (2000 UT) tablet Take 2,000 Units by mouth daily.     clopidogrel (PLAVIX) 75 MG tablet Take 1 tablet (75 mg total) by mouth daily. 30 tablet 3   hydrALAZINE (APRESOLINE) 50 MG tablet Take 50 mg by mouth 2 (two) times daily.     lamoTRIgine (LAMICTAL) 25 MG tablet Take 75 mg by mouth daily.     losartan (COZAAR) 100 MG tablet Take 100 mg by mouth daily.     metoprolol succinate (TOPROL-XL) 25 MG 24 hr tablet Take 1 tablet daily for a systolic blood pressure over 170. 30 tablet 0   REPATHA SURECLICK 140 MG/ML SOAJ Inject 140 mg into the skin See admin instructions. Every 2 weeks on the 1st and 15th of each month     No current facility-administered medications for this visit.     ROS:  See HPI  Physical Exam:  Right Carotid Findings:  +----------+--------+--------+--------+------------------+--------+           PSV cm/sEDV cm/sStenosisPlaque  DescriptionComments  +----------+--------+--------+--------+------------------+--------+  CCA Prox  49      12                                          +----------+--------+--------+--------+------------------+--------+  CCA Mid   47                                                  +----------+--------+--------+--------+------------------+--------+  CCA Distal112     18                                stent     +----------+--------+--------+--------+------------------+--------+  ICA Prox  80      16      Normal                    stent     +----------+--------+--------+--------+------------------+--------+  ICA Mid   119     26  stent     +----------+--------+--------+--------+------------------+--------+  ICA Distal90      26                                          +----------+--------+--------+--------+------------------+--------+  ECA      677             >50%    heterogenous                +----------+--------+--------+--------+------------------+--------+   +----------+--------+-------+----------------+-------------------+           PSV cm/sEDV cmsDescribe        Arm Pressure (mmHG)  +----------+--------+-------+----------------+-------------------+  Subclavian132           Multiphasic, WNL                     +----------+--------+-------+----------------+-------------------+   +---------+--------+--+--------+--+---------+  VertebralPSV cm/s55EDV cm/s14Antegrade  +---------+--------+--+--------+--+---------+      Left Carotid Findings:  +----------+--------+--------+--------+------------------+----------+           PSV cm/sEDV cm/sStenosisPlaque DescriptionComments    +----------+--------+--------+--------+------------------+----------+  CCA Prox  47      15                                             +----------+--------+--------+--------+------------------+----------+  CCA Mid   44      12                                stent       +----------+--------+--------+--------+------------------+----------+  CCA Distal49      13                                stent       +----------+--------+--------+--------+------------------+----------+  ICA Prox  75      17      Normal                    stent       +----------+--------+--------+--------+------------------+----------+  ICA Mid   106     25                                            +----------+--------+--------+--------+------------------+----------+  ICA Distal97      22                                            +----------+--------+--------+--------+------------------+----------+  ECA      40                                        diminished  +----------+--------+--------+--------+------------------+----------+   +----------+--------+--------+----------------+-------------------+           PSV cm/sEDV cm/sDescribe        Arm Pressure (mmHG)  +----------+--------+--------+----------------+-------------------+  [id number]            Multiphasic, WNL                     +----------+--------+--------+----------------+-------------------+   +---------+--------+--+--------+-+---------+  VertebralPSV cm/s46EDV cm/s9Antegrade  +---------+--------+--+--------+-+---------+   05/17/2022 R=60-79, Left not imaged      Summary:  Right Carotid: There is no evidence of stenosis in the right ICA.   Left Carotid: There is no evidence of stenosis in the left ICA.   Vertebrals:  Bilateral vertebral arteries demonstrate antegrade flow.  Subclavians: Normal flow hemodynamics were seen in bilateral subclavian               arteries.   Incision:  incision is well healed Extremities:  moving all 4 ext Neuro: sensation intact, no tongue deviation or facial  droop     Assessment/Plan:  This is a 81 y.o. female who is s/p:s/p Right TCAR  for symptomatic right ICA stenosis.  Carotid duplex demonstrates no stenosis with patent B ICA stents.  She has no neurologic deficits.    She is slowly regaining her activity.  She is increasing her ambulation and plans to return to her gym work out.  She  will f/u in 9 months for repeat carotid duplex.   If she has concerns she will call our office.     Mosetta Pigeon PA-C [facility] [phone]   Clinic MD:  Randie Heinz

## 2022-06-19 ENCOUNTER — Encounter: Payer: Self-pay | Admitting: Physician Assistant

## 2022-06-25 ENCOUNTER — Other Ambulatory Visit: Payer: Self-pay

## 2022-06-25 DIAGNOSIS — I6529 Occlusion and stenosis of unspecified carotid artery: Secondary | ICD-10-CM

## 2022-06-25 DIAGNOSIS — I739 Peripheral vascular disease, unspecified: Secondary | ICD-10-CM

## 2022-07-09 ENCOUNTER — Telehealth: Payer: Self-pay | Admitting: Neurology

## 2022-07-09 ENCOUNTER — Ambulatory Visit: Payer: Medicare PPO | Admitting: Neurology

## 2022-07-09 ENCOUNTER — Encounter: Payer: Self-pay | Admitting: Neurology

## 2022-07-09 VITALS — BP 168/76 | HR 87 | Ht 63.0 in | Wt 143.8 lb

## 2022-07-09 DIAGNOSIS — Z95828 Presence of other vascular implants and grafts: Secondary | ICD-10-CM

## 2022-07-09 DIAGNOSIS — I6521 Occlusion and stenosis of right carotid artery: Secondary | ICD-10-CM | POA: Diagnosis not present

## 2022-07-09 DIAGNOSIS — I69354 Hemiplegia and hemiparesis following cerebral infarction affecting left non-dominant side: Secondary | ICD-10-CM | POA: Diagnosis not present

## 2022-07-09 MED ORDER — ASPIRIN 81 MG PO TBEC: 81.0000 mg | DELAYED_RELEASE_TABLET | Freq: Every day | ORAL | 12 refills | Status: AC

## 2022-07-09 NOTE — Progress Notes (Signed)
[facility] [address] [place]. [place] [address] [phone]       OFFICE FOLLOW-UP NOTE  Ms. Emersynn Deatley Date of Birth:  [birth year] Medical Record Number:  [id number]   HPI: Ms Hullum is a pleasant 81 year old Caucasian lady seen today for initial office follow-up visit following hospital consultation for stroke in October 2023.  She is accompanied by her daughter.  History is obtained from them and review of electronic medical records and I personally reviewed pertinent available imaging films in PACS.Stela Iwasaki is a 81 y.o. female with history of HLD, HTN, CAD, TIA, stroke, bipolar disorder, PAD and bilateral carotid stenosis s/p stenting of left carotid artery who presents with sudden onset left sided weakness, left facial droop, slurred speech, left sided sensory deficit, left sided neglect and left upper quandrantopia.  Patient had just finished a water aerobics class and was waiting on a bench when she experienced sudden onset of the above symptoms.  EMS was called, they found her to have flaccid left hemiparesis and she was brought to the ED as a code stroke.  Symptoms improved on the way to the hospital but not completely resolved.Patient was initially hypotensive in the ambulance with systolic BP in the 80s- Systolic 88.  She was given a 250cc bolus of NS and then became normotensive.  While en route to the hospital, patient's symptoms began to improve with only left sided sensory deficit and visual field cut noted on arrival.  MRI scan of the brain showed acute infarct in the medial right temporal lobe and posterior internal capsule and perhaps the territory of the anterior choroidal artery.  CT angiogram showed mixed plaque at the carotid bifurcation with 60% stenosis was felt to be symptomatic.  Echocardiogram showed ejection fraction of 50 to 55%.  Carotid ultrasound confirmed 60 to 79% proximal right ICA stenosis.  LDL cholesterol of 56 mg percent and  hemoglobin A1c was 6.8.  Patient was seen by vascular surgery and underwent elective TCAR procedure on 05/30/2022 by Dr. Randie Heinz uneventfully.Marland Kitchen  She was discharged on aspirin and Plavix and has done well.  She still has mild weakness of the left hand and leg to ambulate independently.  She is currently doing outpatient physical and occupational Therapy.  She is tolerating aspirin and Plavix with minor bruising but no bleeding.  He was restarted on metformin and states that sugars are doing better.  She was seen by Dr. Randie Heinz for follow-up as follow-up screening carotid ultrasound scheduled.  New complaints today.  ROS:   14 system review of systems is positive for weakness, bruising, walking difficulty all other systems negative  PMH:  Past Medical History:  Diagnosis Date   Anemia    Low iron in the past   Arthritis    Constipation    Coronary artery disease    stent in 2019   Depression    "in the past"   DM type 2 (diabetes mellitus, type 2) (HCC) 02/16/2019   GERD (gastroesophageal reflux disease)    "years ago"   Hyperlipidemia    Hypertension    Left carotid stenosis 02/16/2019   Stroke [facility])    TIA (transient ischemic attack) 02/16/2019    Social History:  Social History   Socioeconomic History   Marital status: Married    Spouse name: Not on file   Number of children: Not on file   Years of education: Not on file   Highest education level: Not on file  Occupational History  Not on file  Tobacco Use   Smoking status: Never   Smokeless tobacco: Never  Vaping Use   Vaping Use: Never used  Substance and Sexual Activity   Alcohol use: Never   Drug use: Never   Sexual activity: Not on file  Other Topics Concern   Not on file  Social History Narrative   Not on file   Social Determinants of Health   Financial Resource Strain: Not on file  Food Insecurity: No Food Insecurity (05/17/2022)   Hunger Vital Sign    Worried About Running Out of Food in the Last Year: Never true     Ran Out of Food in the Last Year: Never true  Transportation Needs: No Transportation Needs (05/17/2022)   PRAPARE - Hydrologist (Medical): No    Lack of Transportation (Non-Medical): No  Physical Activity: Not on file  Stress: Not on file  Social Connections: Not on file  Intimate Partner Violence: Not At Risk (05/17/2022)   Humiliation, Afraid, Rape, and Kick questionnaire    Fear of Current or Ex-Partner: No    Emotionally Abused: No    Physically Abused: No    Sexually Abused: No    Medications:   Current Outpatient Medications on File Prior to Visit  Medication Sig Dispense Refill   Cholecalciferol (VITAMIN D) 50 MCG (2000 UT) tablet Take 2,000 Units by mouth daily.     clopidogrel (PLAVIX) 75 MG tablet Take 1 tablet (75 mg total) by mouth daily. 30 tablet 3   hydrALAZINE (APRESOLINE) 50 MG tablet Take 2 tablets by mouth 3 (three) times daily.     lamoTRIgine (LAMICTAL) 25 MG tablet Take 75 mg by mouth daily.     losartan (COZAAR) 100 MG tablet Take 100 mg by mouth daily.     metFORMIN (GLUCOPHAGE) 500 MG tablet Take 1 tablet by mouth daily with breakfast.     metoprolol succinate (TOPROL-XL) 25 MG 24 hr tablet Take 1 tablet daily for a systolic blood pressure over 170. 30 tablet 0   REPATHA SURECLICK [id number] MG/ML SOAJ Inject 140 mg into the skin See admin instructions. Every 2 weeks on the 1st and 15th of each month     No current facility-administered medications on file prior to visit.    Allergies:   Allergies  Allergen Reactions   Lipitor [Atorvastatin] Other (See Comments)    Muscle pain   Zestril [Lisinopril] Cough        Zoloft [Sertraline] Other (See Comments)    Caused headaches     Physical Exam General: well developed, well nourished pleasant elderly Caucasian lady, seated, in no evident distress Head: head normocephalic and atraumatic.  Neck: supple with soft right greater than left carotid bruits  Cardiovascular: regular  rate and rhythm, no murmurs Musculoskeletal: no deformity Skin:  no rash/petichiae.  Carotid surgical scar on the right side Vascular:  Normal pulses all extremities Vitals:   07/09/22 0840  BP: (!) 168/76  Pulse: 87   Neurologic Exam Mental Status: Awake and fully alert. Oriented to place and time. Recent and remote memory intact. Attention span, concentration and fund of knowledge appropriate. Mood and affect appropriate.  Cranial Nerves: Fundoscopic exam reveals sharp disc margins. Pupils equal, briskly reactive to light. Extraocular movements full without nystagmus. Visual fields full to confrontation. Hearing intact. Facial sensation intact. Face, tongue, palate moves normally and symmetrically.  Motor: Normal bulk and tone. Normal strength in all tested extremity muscles except mild weakness  of left grip and intrinsic hand muscles.  Mild left ankle dorsiflexor weakness.  Orbits right over left upper extremity.. Sensory.: intact to touch ,pinprick .position and vibratory sensation.  Coordination: Rapid alternating movements normal in all extremities. Finger-to-nose and heel-to-shin performed accurately bilaterally. Gait and Station: Arises from chair without difficulty. Stance is normal. Gait demonstrates normal stride length and balance but drags left leg slightly while walking.  Not able to heel, toe and tandem walk without difficulty.  Reflexes: 1+ and symmetric. Toes downgoing.   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 secondary to symptomatic high-grade proximal right carotid stenosis in October 2023 treated with IV thrombolysis with TNK followed by elective right carotid revascularization with TCAR .Vascular risk factors carotid stenosis, hyperlipidemia and hypertension     PLAN:I had a long d/w patient about his recent stroke, risk for recurrent stroke/TIAs,  personally independently reviewed imaging studies and stroke evaluation results and answered questions.Continue aspirin 81 mg daily and clopidogrel 75 mg daily  for 6 months and then aspirin alone for secondary stroke prevention and maintain strict control of hypertension with blood pressure goal below 130/90, diabetes with hemoglobin A1c goal below 6.5% and lipids with LDL cholesterol goal below 70 mg/dL. I also advised the patient to eat a healthy diet with plenty of whole grains, cereals, fruits and vegetables, exercise regularly and maintain ideal body weight . Check surveillance carotid dopplers 6 monthly for stent restenosis.Continue ongoing PT/OT .Followup in the future with me in 6 months or call earlier if needed. Greater than 50% of time during this 35 minute visit was spent on counseling,explanation of diagnosis stroke and thrombolysis and carotid, planning of further management, discussion with patient and family and coordination of care Antony Contras, MD Note: This document was prepared with digital dictation and possible smart phrase technology. Any transcriptional errors that result from this process are unintentional

## 2022-07-09 NOTE — Patient Instructions (Signed)
I had a long d/w patient about his recent stroke, risk for recurrent stroke/TIAs, personally independently reviewed imaging studies and stroke evaluation results and answered questions.Continue aspirin 81 mg daily and clopidogrel 75 mg daily  for 6 months and then aspirin alone for secondary stroke prevention and maintain strict control of hypertension with blood pressure goal below 130/90, diabetes with hemoglobin A1c goal below 6.5% and lipids with LDL cholesterol goal below 70 mg/dL. I also advised the patient to eat a healthy diet with plenty of whole grains, cereals, fruits and vegetables, exercise regularly and maintain ideal body weight . Check surveillance carotid dopplers 6 monthly for stent restenosis.Continue ongoing PT/OT .Followup in the future with me in 6 months or call earlier if needed.  Stroke Prevention Some medical conditions and behaviors can lead to a higher chance of having a stroke. You can help prevent a stroke by eating healthy, exercising, not smoking, and managing any medical conditions you have. Stroke is a leading cause of functional impairment. Primary prevention is particularly important because a majority of strokes are first-time events. Stroke changes the lives of not only those who experience a stroke but also their family and other caregivers. How can this condition affect me? A stroke is a medical emergency and should be treated right away. A stroke can lead to brain damage and can sometimes be life-threatening. If a person gets medical treatment right away, there is a better chance of surviving and recovering from a stroke. What can increase my risk? The following medical conditions may increase your risk of a stroke: Cardiovascular disease. High blood pressure (hypertension). Diabetes. High cholesterol. Sickle cell disease. Blood clotting disorders (hypercoagulable state). Obesity. Sleep disorders (obstructive sleep apnea). Other risk factors include: Being  older than age 36. Having a history of blood clots, stroke, or mini-stroke (transient ischemic attack, TIA). Genetic factors, such as race, ethnicity, or a family history of stroke. Smoking cigarettes or using other tobacco products. Taking birth control pills, especially if you also use tobacco. Heavy use of alcohol or drugs, especially cocaine and methamphetamine. Physical inactivity. What actions can I take to prevent this? Manage your health conditions High cholesterol levels. Eating a healthy diet is important for preventing high cholesterol. If cholesterol cannot be managed through diet alone, you may need to take medicines. Take any prescribed medicines to control your cholesterol as told by your health care provider. Hypertension. To reduce your risk of stroke, try to keep your blood pressure below 130/80. Eating a healthy diet and exercising regularly are important for controlling blood pressure. If these steps are not enough to manage your blood pressure, you may need to take medicines. Take any prescribed medicines to control hypertension as told by your health care provider. Ask your health care provider if you should monitor your blood pressure at home. Have your blood pressure checked every year, even if your blood pressure is normal. Blood pressure increases with age and some medical conditions. Diabetes. Eating a healthy diet and exercising regularly are important parts of managing your blood sugar (glucose). If your blood sugar cannot be managed through diet and exercise, you may need to take medicines. Take any prescribed medicines to control your diabetes as told by your health care provider. Get evaluated for obstructive sleep apnea. Talk to your health care provider about getting a sleep evaluation if you snore a lot or have excessive sleepiness. Make sure that any other medical conditions you have, such as atrial fibrillation or atherosclerosis, are  managed.  Nutrition Follow instructions from your health care provider about what to eat or drink to help manage your health condition. These instructions may include: Reducing your daily calorie intake. Limiting how much salt (sodium) you use to 1,500 milligrams (mg) each day. Using only healthy fats for cooking, such as olive oil, canola oil, or sunflower oil. Eating healthy foods. You can do this by: Choosing foods that are high in fiber, such as whole grains, and fresh fruits and vegetables. Eating at least 5 servings of fruits and vegetables a day. Try to fill one-half of your plate with fruits and vegetables at each meal. Choosing lean protein foods, such as lean cuts of meat, poultry without skin, fish, tofu, beans, and nuts. Eating low-fat dairy products. Avoiding foods that are high in sodium. This can help lower blood pressure. Avoiding foods that have saturated fat, trans fat, and cholesterol. This can help prevent high cholesterol. Avoiding processed and prepared foods. Counting your daily carbohydrate intake.  Lifestyle If you drink alcohol: Limit how much you have to: 0-1 drink a day for women who are not pregnant. 0-2 drinks a day for men. Know how much alcohol is in your drink. In the [place] one drink equals one 12 oz bottle of beer ( ), one 5 oz glass of wine ( ), or one 1 oz glass of hard liquor (45mL). Do not use any products that contain nicotine or tobacco. These products include cigarettes, chewing tobacco, and vaping devices, such as e-cigarettes. If you need help quitting, ask your health care provider. Avoid secondhand smoke. Do not use drugs. Activity  Try to stay at a healthy weight. Get at least 30 minutes of exercise on most days, such as: Fast walking. Biking. Swimming. Medicines Take over-the-counter and prescription medicines only as told by your health care provider. Aspirin or blood thinners (antiplatelets or anticoagulants) may be recommended  to reduce your risk of forming blood clots that can lead to stroke. Avoid taking birth control pills. Talk to your health care provider about the risks of taking birth control pills if: You are over 80 years old. You smoke. You get very bad headaches. You have had a blood clot. Where to find more information American Stroke Association: www.strokeassociation.org Get help right away if: You or a loved one has any symptoms of a stroke. "BE FAST" is an easy way to remember the main warning signs of a stroke: B - Balance. Signs are dizziness, sudden trouble walking, or loss of balance. E - Eyes. Signs are trouble seeing or a sudden change in vision. F - Face. Signs are sudden weakness or numbness of the face, or the face or eyelid drooping on one side. A - Arms. Signs are weakness or numbness in an arm. This happens suddenly and usually on one side of the body. S - Speech. Signs are sudden trouble speaking, slurred speech, or trouble understanding what people say. T - Time. Time to call emergency services. Write down what time symptoms started. You or a loved one has other signs of a stroke, such as: A sudden, severe headache with no known cause. Nausea or vomiting. Seizure. These symptoms may represent a serious problem that is an emergency. Do not wait to see if the symptoms will go away. Get medical help right away. Call your local emergency services (911 in the [place] Do not drive yourself to the hospital. Summary You can help to prevent a stroke by eating healthy, exercising, not smoking, limiting alcohol intake, and managing any  medical conditions you may have. Do not use any products that contain nicotine or tobacco. These include cigarettes, chewing tobacco, and vaping devices, such as e-cigarettes. If you need help quitting, ask your health care provider. Remember "BE FAST" for warning signs of a stroke. Get help right away if you or a loved one has any of these signs. This information  is not intended to replace advice given to you by your health care provider. Make sure you discuss any questions you have with your health care provider. Document Revised: 02/27/2020 Document Reviewed: 02/27/2020 Elsevier Patient Education  2023 [employer].

## 2022-07-09 NOTE — Telephone Encounter (Signed)
Pt is requesting a call to discuss the possibility of getting steroid injections. She wants to know if this will cause any issues with her blood pressure. Please advise at [phone].

## 2022-07-14 NOTE — Telephone Encounter (Signed)
Pt has not reviewed mychart message. I called and lvm about this message. Will wait to hear back from the pt at this point.

## 2022-07-17 ENCOUNTER — Other Ambulatory Visit: Payer: Self-pay | Admitting: Physician Assistant

## 2022-08-28 ENCOUNTER — Other Ambulatory Visit: Payer: Self-pay

## 2022-08-28 NOTE — Patient Outreach (Signed)
First telephone outreach attempt to obtain mRS. No answer. Left message for returned call.  Karyna Bessler [facility] Management Assistant [phone]  

## 2022-09-01 ENCOUNTER — Other Ambulatory Visit: Payer: Self-pay

## 2022-09-01 NOTE — Patient Outreach (Signed)
Second telephone outreach attempt to obtain mRS. No answer. Left message for returned call.  Margaret Butler [facility] Management Assistant [phone]  

## 2022-09-02 ENCOUNTER — Other Ambulatory Visit: Payer: Self-pay

## 2022-09-02 NOTE — Patient Outreach (Signed)
Telephone outreach to patient's husband to obtain mRS was successfully completed. MRS= 3   Feels that wife needs an extension with PT as she is not motivated to move around much at home but does everything she needs to when in PT. Also has been having trouble getting out of bed and issue with are but could be due to another medical issue along with the stroke.  [place] Management Assistant [phone]

## 2022-10-29 ENCOUNTER — Telehealth: Payer: Self-pay

## 2022-10-29 NOTE — Telephone Encounter (Signed)
Orders faxed to [facility]

## 2022-12-05 ENCOUNTER — Telehealth: Payer: Self-pay

## 2022-12-05 NOTE — Telephone Encounter (Signed)
Caller: Patient  Concern: PCP heard bruit on R side at carotid, pt been off Plavix since sometime in January, pt feels generalized weakness, no other symptoms  Consulted: Matt, PA who advised no need to restart Plavix at this time, continue ASA 81 mg  Resolution: Appointment scheduled from recall  Next Appt: Appointment scheduled for 5/8 for [place] & PA

## 2022-12-10 ENCOUNTER — Ambulatory Visit (HOSPITAL_COMMUNITY)
Admission: RE | Admit: 2022-12-10 | Discharge: 2022-12-10 | Disposition: A | Discharge status: Home / Self Care | Payer: Medicare PPO | Source: Ambulatory Visit | Attending: Vascular Surgery | Admitting: Vascular Surgery

## 2022-12-10 ENCOUNTER — Ambulatory Visit: Payer: Medicare PPO | Admitting: Physician Assistant

## 2022-12-10 ENCOUNTER — Encounter: Payer: Self-pay | Admitting: Physician Assistant

## 2022-12-10 VITALS — BP 171/72 | HR 78 | Temp 97.6°F | Resp 20 | Ht 63.0 in | Wt 135.0 lb

## 2022-12-10 DIAGNOSIS — I6529 Occlusion and stenosis of unspecified carotid artery: Secondary | ICD-10-CM | POA: Insufficient documentation

## 2022-12-10 DIAGNOSIS — I6523 Occlusion and stenosis of bilateral carotid arteries: Secondary | ICD-10-CM | POA: Diagnosis not present

## 2022-12-10 NOTE — Progress Notes (Signed)
Office Note   History of Present Illness   Margaret Butler is a 82 y.o. (09-25-1940) female who presents for surveillance of carotid artery stenosis.  She has a history of left carotid endarterectomy on 02/15/2019 by Dr. Randie Heinz.  She subsequently developed symptomatic restenosis and required left TCAR on 07/20/2019 by Dr. Randie Heinz.  She also recently underwent right TCAR on 05/20/2022 by Dr. Randie Heinz.  This was done for symptomatic right ICA stenosis.  She returns today for follow-up.  She denies any recent CVA or TIA diagnosis.  She denies any strokelike symptoms such as slurred speech, facial droop, sudden weakness/numbness, or sudden visual changes.  She states in a few months she will be getting right shoulder surgery.  She is compliant with her daily aspirin and Plavix.  She is also on Repatha. Current Outpatient Medications  Medication Sig Dispense Refill   aspirin EC 81 MG tablet Take 1 tablet (81 mg total) by mouth daily. Swallow whole. 30 tablet 12   Cholecalciferol (VITAMIN D) 50 MCG (2000 UT) tablet Take 2,000 Units by mouth daily.     clopidogrel (PLAVIX) 75 MG tablet TAKE 1 TABLET BY MOUTH EVERY DAY 90 tablet 1   hydrALAZINE (APRESOLINE) 50 MG tablet Take 2 tablets by mouth 3 (three) times daily.     lamoTRIgine (LAMICTAL) 25 MG tablet Take 75 mg by mouth daily.     losartan (COZAAR) 100 MG tablet Take 100 mg by mouth daily.     metFORMIN (GLUCOPHAGE) 500 MG tablet Take 1 tablet by mouth daily with breakfast.     metoprolol succinate (TOPROL-XL) 25 MG 24 hr tablet Take 1 tablet daily for a systolic blood pressure over 170. 30 tablet 0   REPATHA SURECLICK 140 MG/ML SOAJ Inject 140 mg into the skin See admin instructions. Every 2 weeks on the 1st and 15th of each month     No current facility-administered medications for this visit.    REVIEW OF SYSTEMS (negative unless checked):   Cardiac:  []  Chest pain or chest pressure? []  Shortness of breath upon activity? []  Shortness of breath  when lying flat? []  Irregular heart rhythm?  Vascular:  []  Pain in calf, thigh, or hip brought on by walking? []  Pain in feet at night that wakes you up from your sleep? []  Blood clot in your veins? []  Leg swelling?  Pulmonary:  []  Oxygen at home? []  Productive cough? []  Wheezing?  Neurologic:  []  Sudden weakness in arms or legs? []  Sudden numbness in arms or legs? []  Sudden onset of difficult speaking or slurred speech? []  Temporary loss of vision in one eye? []  Problems with dizziness?  Gastrointestinal:  []  Blood in stool? []  Vomited blood?  Genitourinary:  []  Burning when urinating? []  Blood in urine?  Psychiatric:  []  Major depression  Hematologic:  []  Bleeding problems? []  Problems with blood clotting?  Dermatologic:  []  Rashes or ulcers?  Constitutional:  []  Fever or chills?  Ear/Nose/Throat:  []  Change in hearing? []  Nose bleeds? []  Sore throat?  Musculoskeletal:  []  Back pain? []  Joint pain? []  Muscle pain?   Physical Examination   Vitals:   12/10/22 1053 12/10/22 1055  BP: (!) 186/75 (!) 171/72  Pulse: 78   Resp: 20   Temp: 97.6 F (36.4 C)   TempSrc: Temporal   SpO2: 98%   Weight: 135 lb (61.2 kg)   Height: 5\' 3"  (1.6 m)    Body mass index is 23.91 kg/m.  General:  WDWN  in NAD; vital signs documented above Gait: Not observed HENT: WNL, normocephalic Pulmonary: normal non-labored breathing  Cardiac: regular Abdomen: soft, NT, no masses Skin: without rashes Vascular Exam/Pulses: palpable radial pulses Extremities: without ischemic changes, without Gangrene , without cellulitis; without open wounds;  Musculoskeletal: no muscle wasting or atrophy  Neurologic: A&O X 3;  No focal weakness or paresthesias are detected Psychiatric:  The pt has Normal affect.  Non-Invasive Vascular Imaging   B Carotid Duplex (12/10/2022):  R ICA stenosis:  patent stent without stenosis R VA:  patent and antegrade L ICA stenosis:   patent stent  without stenosis L VA:  patent and antegrade   Medical Decision Making   Margaret Butler is a 82 y.o. female who presents for surveillance of carotid artery stenosis  Based on the patient's vascular studies, her bilateral ICA stents are patent without restenosis. She denies any strokelike symptoms such as slurred speech, facial droop, sudden visual changes, sudden weakness/numbness. She has palpable radial pulses bilaterally.  She has no focal weakness or numbness on exam She is compliant with her daily aspirin and Plavix.  She is also on Repatha injections.  She can follow-up with our office in 1 year with repeat carotid duplex   Loel Dubonnet PA-C [facility] Office: [phone]  Clinic MD: Randie Heinz

## 2022-12-17 ENCOUNTER — Ambulatory Visit: Payer: Medicare PPO

## 2022-12-17 ENCOUNTER — Encounter (HOSPITAL_COMMUNITY): Payer: Medicare PPO

## 2022-12-19 ENCOUNTER — Other Ambulatory Visit: Payer: Self-pay

## 2022-12-19 DIAGNOSIS — I6523 Occlusion and stenosis of bilateral carotid arteries: Secondary | ICD-10-CM

## 2023-01-21 ENCOUNTER — Ambulatory Visit: Payer: Medicare PPO | Admitting: Neurology

## 2023-08-19 ENCOUNTER — Ambulatory Visit: Payer: Medicare PPO | Admitting: Neurology

## 2023-08-19 ENCOUNTER — Telehealth: Payer: Self-pay | Admitting: Neurology

## 2023-08-19 NOTE — Telephone Encounter (Signed)
 Pt canceled appt due to her being sick. Will CB to r/s

## 2024-08-10 ENCOUNTER — Telehealth: Payer: Self-pay

## 2024-08-10 NOTE — Telephone Encounter (Signed)
 Pt's husband, Vitt, called reporting pt having new memory issues and concerned pt's carotid artery may be re-stenosed.  Offered appt for Carotid US  and PA appt but Vitt wanted to follow up with pt's facility provider first.
